# Patient Record
Sex: Male | Born: 1935 | Hispanic: No | Smoking: Former smoker
Health system: Southern US, Community
[De-identification: ages and names within clinical notes are randomized; demographics above are authoritative.]

## PROBLEM LIST (undated history)

## (undated) DIAGNOSIS — R739 Hyperglycemia, unspecified: Secondary | ICD-10-CM

## (undated) DIAGNOSIS — G8929 Other chronic pain: Secondary | ICD-10-CM

## (undated) DIAGNOSIS — R079 Chest pain, unspecified: Secondary | ICD-10-CM

## (undated) DIAGNOSIS — M1990 Unspecified osteoarthritis, unspecified site: Secondary | ICD-10-CM

## (undated) DIAGNOSIS — I10 Essential (primary) hypertension: Secondary | ICD-10-CM

## (undated) DIAGNOSIS — R69 Illness, unspecified: Secondary | ICD-10-CM

## (undated) DIAGNOSIS — E7800 Pure hypercholesterolemia, unspecified: Secondary | ICD-10-CM

## (undated) DIAGNOSIS — Z9009 Acquired absence of other part of head and neck: Secondary | ICD-10-CM

## (undated) DIAGNOSIS — H8110 Benign paroxysmal vertigo, unspecified ear: Secondary | ICD-10-CM

## (undated) DIAGNOSIS — M545 Low back pain, unspecified: Secondary | ICD-10-CM

## (undated) DIAGNOSIS — G5603 Carpal tunnel syndrome, bilateral upper limbs: Secondary | ICD-10-CM

## (undated) DIAGNOSIS — E890 Postprocedural hypothyroidism: Secondary | ICD-10-CM

## (undated) DIAGNOSIS — F0390 Unspecified dementia without behavioral disturbance: Secondary | ICD-10-CM

## (undated) DIAGNOSIS — E0590 Thyrotoxicosis, unspecified without thyrotoxic crisis or storm: Secondary | ICD-10-CM

## (undated) DIAGNOSIS — R011 Cardiac murmur, unspecified: Secondary | ICD-10-CM

## (undated) DIAGNOSIS — K5790 Diverticulosis of intestine, part unspecified, without perforation or abscess without bleeding: Secondary | ICD-10-CM

## (undated) DIAGNOSIS — D649 Anemia, unspecified: Secondary | ICD-10-CM

## (undated) DIAGNOSIS — M353 Polymyalgia rheumatica: Secondary | ICD-10-CM

## (undated) DIAGNOSIS — G629 Polyneuropathy, unspecified: Secondary | ICD-10-CM

## (undated) DIAGNOSIS — N400 Enlarged prostate without lower urinary tract symptoms: Secondary | ICD-10-CM

## (undated) DIAGNOSIS — E611 Iron deficiency: Secondary | ICD-10-CM

## (undated) DIAGNOSIS — K601 Chronic anal fissure: Secondary | ICD-10-CM

## (undated) DIAGNOSIS — Z9889 Other specified postprocedural states: Secondary | ICD-10-CM

## (undated) DIAGNOSIS — L439 Lichen planus, unspecified: Secondary | ICD-10-CM

## (undated) DIAGNOSIS — K219 Gastro-esophageal reflux disease without esophagitis: Secondary | ICD-10-CM

## (undated) HISTORY — PX: THYROIDECTOMY, PARTIAL: SHX18

## (undated) HISTORY — DX: Chronic anal fissure: K60.1

## (undated) HISTORY — DX: Illness, unspecified: R69

## (undated) HISTORY — DX: Cardiac murmur, unspecified: R01.1

## (undated) HISTORY — DX: Low back pain: M54.5

## (undated) HISTORY — DX: Other chronic pain: G89.29

## (undated) HISTORY — DX: Benign paroxysmal vertigo, unspecified ear: H81.10

## (undated) HISTORY — DX: Pure hypercholesterolemia, unspecified: E78.00

## (undated) HISTORY — PX: APPENDECTOMY: SHX54

## (undated) HISTORY — PX: OTHER SURGICAL HISTORY: SHX169

## (undated) HISTORY — DX: Polyneuropathy, unspecified: G62.9

## (undated) HISTORY — DX: Postprocedural hypothyroidism: E89.0

## (undated) HISTORY — DX: Carpal tunnel syndrome, bilateral upper limbs: G56.03

## (undated) HISTORY — DX: Low back pain, unspecified: M54.50

## (undated) HISTORY — DX: Gastro-esophageal reflux disease without esophagitis: K21.9

## (undated) HISTORY — DX: Gilbert syndrome: E80.4

## (undated) HISTORY — DX: Essential (primary) hypertension: I10

## (undated) HISTORY — DX: Hyperglycemia, unspecified: R73.9

## (undated) HISTORY — DX: Iron deficiency: E61.1

## (undated) HISTORY — PX: TONSILLECTOMY: SUR1361

## (undated) HISTORY — DX: Diverticulosis of intestine, part unspecified, without perforation or abscess without bleeding: K57.90

## (undated) HISTORY — DX: Thyrotoxicosis, unspecified without thyrotoxic crisis or storm: E05.90

## (undated) HISTORY — DX: Acquired absence of other organs: Z98.890

## (undated) HISTORY — DX: Chest pain, unspecified: R07.9

## (undated) HISTORY — DX: Lichen planus, unspecified: L43.9

## (undated) HISTORY — DX: Anemia, unspecified: D64.9

## (undated) HISTORY — DX: Polymyalgia rheumatica: M35.3

## (undated) HISTORY — DX: Benign prostatic hyperplasia without lower urinary tract symptoms: N40.0

## (undated) HISTORY — DX: Unspecified osteoarthritis, unspecified site: M19.90

## (undated) HISTORY — DX: Acquired absence of other part of head and neck: Z90.09

---

## 2011-04-09 DIAGNOSIS — Z79899 Other long term (current) drug therapy: Secondary | ICD-10-CM | POA: Diagnosis not present

## 2011-04-09 DIAGNOSIS — E782 Mixed hyperlipidemia: Secondary | ICD-10-CM | POA: Diagnosis not present

## 2011-04-09 DIAGNOSIS — I10 Essential (primary) hypertension: Secondary | ICD-10-CM | POA: Diagnosis not present

## 2011-04-09 DIAGNOSIS — M2550 Pain in unspecified joint: Secondary | ICD-10-CM | POA: Diagnosis not present

## 2011-05-12 DIAGNOSIS — Z79899 Other long term (current) drug therapy: Secondary | ICD-10-CM | POA: Diagnosis not present

## 2011-05-12 DIAGNOSIS — I10 Essential (primary) hypertension: Secondary | ICD-10-CM | POA: Diagnosis not present

## 2011-05-12 DIAGNOSIS — N400 Enlarged prostate without lower urinary tract symptoms: Secondary | ICD-10-CM | POA: Diagnosis not present

## 2011-05-12 DIAGNOSIS — E782 Mixed hyperlipidemia: Secondary | ICD-10-CM | POA: Diagnosis not present

## 2011-07-15 DIAGNOSIS — E782 Mixed hyperlipidemia: Secondary | ICD-10-CM | POA: Diagnosis not present

## 2011-07-15 DIAGNOSIS — I10 Essential (primary) hypertension: Secondary | ICD-10-CM | POA: Diagnosis not present

## 2011-09-08 DIAGNOSIS — H2510 Age-related nuclear cataract, unspecified eye: Secondary | ICD-10-CM | POA: Diagnosis not present

## 2011-09-08 DIAGNOSIS — H25049 Posterior subcapsular polar age-related cataract, unspecified eye: Secondary | ICD-10-CM | POA: Diagnosis not present

## 2011-09-18 DIAGNOSIS — IMO0002 Reserved for concepts with insufficient information to code with codable children: Secondary | ICD-10-CM | POA: Diagnosis not present

## 2011-09-18 DIAGNOSIS — H2589 Other age-related cataract: Secondary | ICD-10-CM | POA: Diagnosis not present

## 2011-09-18 DIAGNOSIS — H2510 Age-related nuclear cataract, unspecified eye: Secondary | ICD-10-CM | POA: Diagnosis not present

## 2011-09-18 DIAGNOSIS — H25049 Posterior subcapsular polar age-related cataract, unspecified eye: Secondary | ICD-10-CM | POA: Diagnosis not present

## 2011-11-18 DIAGNOSIS — I10 Essential (primary) hypertension: Secondary | ICD-10-CM | POA: Diagnosis not present

## 2011-11-18 DIAGNOSIS — Z1331 Encounter for screening for depression: Secondary | ICD-10-CM | POA: Diagnosis not present

## 2011-11-18 DIAGNOSIS — Z79899 Other long term (current) drug therapy: Secondary | ICD-10-CM | POA: Diagnosis not present

## 2011-11-18 DIAGNOSIS — Z23 Encounter for immunization: Secondary | ICD-10-CM | POA: Diagnosis not present

## 2011-11-18 DIAGNOSIS — E782 Mixed hyperlipidemia: Secondary | ICD-10-CM | POA: Diagnosis not present

## 2011-11-18 DIAGNOSIS — Z0000 Encounter for general adult medical examination without abnormal findings: Secondary | ICD-10-CM | POA: Diagnosis not present

## 2011-11-18 DIAGNOSIS — E039 Hypothyroidism, unspecified: Secondary | ICD-10-CM | POA: Diagnosis not present

## 2012-05-18 DIAGNOSIS — I10 Essential (primary) hypertension: Secondary | ICD-10-CM | POA: Diagnosis not present

## 2012-05-18 DIAGNOSIS — Z79899 Other long term (current) drug therapy: Secondary | ICD-10-CM | POA: Diagnosis not present

## 2012-05-18 DIAGNOSIS — R011 Cardiac murmur, unspecified: Secondary | ICD-10-CM | POA: Diagnosis not present

## 2012-05-18 DIAGNOSIS — E782 Mixed hyperlipidemia: Secondary | ICD-10-CM | POA: Diagnosis not present

## 2012-05-18 DIAGNOSIS — M1990 Unspecified osteoarthritis, unspecified site: Secondary | ICD-10-CM | POA: Diagnosis not present

## 2012-06-03 DIAGNOSIS — R011 Cardiac murmur, unspecified: Secondary | ICD-10-CM | POA: Diagnosis not present

## 2012-11-02 DIAGNOSIS — Z961 Presence of intraocular lens: Secondary | ICD-10-CM | POA: Diagnosis not present

## 2012-11-02 DIAGNOSIS — H52209 Unspecified astigmatism, unspecified eye: Secondary | ICD-10-CM | POA: Diagnosis not present

## 2012-11-25 DIAGNOSIS — I10 Essential (primary) hypertension: Secondary | ICD-10-CM | POA: Diagnosis not present

## 2012-11-25 DIAGNOSIS — E039 Hypothyroidism, unspecified: Secondary | ICD-10-CM | POA: Diagnosis not present

## 2012-11-25 DIAGNOSIS — Z23 Encounter for immunization: Secondary | ICD-10-CM | POA: Diagnosis not present

## 2012-11-25 DIAGNOSIS — Z0000 Encounter for general adult medical examination without abnormal findings: Secondary | ICD-10-CM | POA: Diagnosis not present

## 2012-11-25 DIAGNOSIS — M1990 Unspecified osteoarthritis, unspecified site: Secondary | ICD-10-CM | POA: Diagnosis not present

## 2012-11-25 DIAGNOSIS — E782 Mixed hyperlipidemia: Secondary | ICD-10-CM | POA: Diagnosis not present

## 2012-11-25 DIAGNOSIS — Z79899 Other long term (current) drug therapy: Secondary | ICD-10-CM | POA: Diagnosis not present

## 2012-11-25 DIAGNOSIS — R7301 Impaired fasting glucose: Secondary | ICD-10-CM | POA: Diagnosis not present

## 2012-11-25 DIAGNOSIS — Z1331 Encounter for screening for depression: Secondary | ICD-10-CM | POA: Diagnosis not present

## 2012-11-30 DIAGNOSIS — H903 Sensorineural hearing loss, bilateral: Secondary | ICD-10-CM | POA: Diagnosis not present

## 2012-12-02 DIAGNOSIS — R7301 Impaired fasting glucose: Secondary | ICD-10-CM | POA: Diagnosis not present

## 2013-06-01 DIAGNOSIS — Z79899 Other long term (current) drug therapy: Secondary | ICD-10-CM | POA: Diagnosis not present

## 2013-06-01 DIAGNOSIS — E782 Mixed hyperlipidemia: Secondary | ICD-10-CM | POA: Diagnosis not present

## 2013-06-01 DIAGNOSIS — I10 Essential (primary) hypertension: Secondary | ICD-10-CM | POA: Diagnosis not present

## 2013-08-31 DIAGNOSIS — I10 Essential (primary) hypertension: Secondary | ICD-10-CM | POA: Diagnosis not present

## 2013-08-31 DIAGNOSIS — E782 Mixed hyperlipidemia: Secondary | ICD-10-CM | POA: Diagnosis not present

## 2013-11-30 DIAGNOSIS — D649 Anemia, unspecified: Secondary | ICD-10-CM | POA: Diagnosis not present

## 2013-11-30 DIAGNOSIS — Z79899 Other long term (current) drug therapy: Secondary | ICD-10-CM | POA: Diagnosis not present

## 2013-11-30 DIAGNOSIS — E780 Pure hypercholesterolemia: Secondary | ICD-10-CM | POA: Diagnosis not present

## 2013-11-30 DIAGNOSIS — I10 Essential (primary) hypertension: Secondary | ICD-10-CM | POA: Diagnosis not present

## 2013-11-30 DIAGNOSIS — K219 Gastro-esophageal reflux disease without esophagitis: Secondary | ICD-10-CM | POA: Diagnosis not present

## 2013-11-30 DIAGNOSIS — Z1389 Encounter for screening for other disorder: Secondary | ICD-10-CM | POA: Diagnosis not present

## 2013-11-30 DIAGNOSIS — E890 Postprocedural hypothyroidism: Secondary | ICD-10-CM | POA: Diagnosis not present

## 2013-11-30 DIAGNOSIS — Z0000 Encounter for general adult medical examination without abnormal findings: Secondary | ICD-10-CM | POA: Diagnosis not present

## 2013-11-30 DIAGNOSIS — G603 Idiopathic progressive neuropathy: Secondary | ICD-10-CM | POA: Diagnosis not present

## 2013-12-01 DIAGNOSIS — Z23 Encounter for immunization: Secondary | ICD-10-CM | POA: Diagnosis not present

## 2014-01-09 ENCOUNTER — Encounter: Payer: Self-pay | Admitting: *Deleted

## 2014-01-11 ENCOUNTER — Encounter: Payer: Self-pay | Admitting: Cardiology

## 2014-01-11 ENCOUNTER — Ambulatory Visit (INDEPENDENT_AMBULATORY_CARE_PROVIDER_SITE_OTHER): Payer: Medicare Other | Admitting: Cardiology

## 2014-01-11 VITALS — BP 138/84 | HR 64 | Ht 69.0 in | Wt 159.6 lb

## 2014-01-11 DIAGNOSIS — E785 Hyperlipidemia, unspecified: Secondary | ICD-10-CM | POA: Diagnosis not present

## 2014-01-11 DIAGNOSIS — I10 Essential (primary) hypertension: Secondary | ICD-10-CM

## 2014-01-11 DIAGNOSIS — I208 Other forms of angina pectoris: Secondary | ICD-10-CM | POA: Diagnosis not present

## 2014-01-11 DIAGNOSIS — R079 Chest pain, unspecified: Secondary | ICD-10-CM | POA: Diagnosis not present

## 2014-01-11 NOTE — Progress Notes (Signed)
[address] [place], [place]  [address] Phone: [phone] Fax:  [phone]  Date:  01/11/2014   ID:  Benjamin Banks, DOB [birth year], MRN [id number]  PCP:  Benjamin Banks   History of Present Illness: Benjamin Banks is a 80 y.o. male here for the evaluation of exertional chest pain at the request of Dr. Pete Banks. Has risk factors of age, hypertension, previously described noncardiac chest pain. When he is out walking, he feels a discomfort in his upper chest that sometimes radiates to both sides of his jaw. When he stops it resolves. No rest discomfort. Over the past year this is been noticeable, however he downplays his symptoms.  He has been described as having aortic sclerosis after echocardiogram on 4/14 was performed.  He worked for years at [employer]. Graduated from [employer] in 1949. He smoked for several years but quit many years ago he states. He walks up proximally 1 mile a day. Mildly limited by arthritis, most noticeable in his right thumb joint.  Wt Readings from Last 3 Encounters:  01/11/14 159 lb 9.6 oz (72.394 kg)     Past Medical History  Diagnosis Date  . Hypercholesteremia   . Diverticulosis   . Benign positional vertigo   . Hypertension     labils  . Anemia   . Chronic low back pain   . Bilateral carpal tunnel syndrome   . DJD (degenerative joint disease)   . Polymyalgia rheumatica   . BPH (benign prostatic hyperplasia)   . Chest pain     noncardiac  . Disease     perrone  . GERD (gastroesophageal reflux disease)   . Hyperthyroidism   . H/O thyroidectomy   . Chronic anal fissure   . Lichen planus   . Peripheral neuropathy   . Iron deficiency   . Elevated blood sugar   . Heart murmur   . Gilbert's syndrome     Past Surgical History  Procedure Laterality Date  . Tonsillectomy    . Appendectomy    . Thyroidectomy, partial    . Hemrrhoidectomy    . Left lens implant    . Bottom teeth  removed with implants    . Left and right cataract      Current Outpatient Prescriptions  Medication Sig Dispense Refill  . amLODipine (NORVASC) 5 MG tablet Take 5 mg by mouth daily.    Marland Kitchen. aspirin 81 MG tablet Take 81 mg by mouth daily.    Marland Kitchen. atorvastatin (LIPITOR) 10 MG tablet Take 10 mg by mouth daily.    . Cholecalciferol (VITAMIN D-3) 1000 UNITS CAPS Take 1 capsule by mouth daily.    . hydrochlorothiazide (MICROZIDE) 12.5 MG capsule Take 12.5 mg by mouth daily.    Marland Kitchen. levothyroxine (SYNTHROID, LEVOTHROID) 88 MCG tablet Take 88 mcg by mouth every other day. Take every other day when not taking the 50 mcg    . SYNTHROID 50 MCG tablet Take 50 mcg by mouth every other day.  11   No current facility-administered medications for this visit.    Allergies:   No Known Allergies  Social History:  The patient  reports that he has quit smoking. He does not have any smokeless tobacco history on file. He reports that he drinks alcohol.   Family History  Problem Relation Age of Onset  . COPD Mother   . COPD Father   . Alcohol abuse Father  ROS:  Please see the history of present illness.   Denies any fevers, chills, orthopnea, PND   All other systems reviewed and negative.   PHYSICAL EXAM: VS:  BP 138/84 mmHg  Pulse 64  Ht 5\' 9"  (1.753 m)  Wt 159 lb 9.6 oz (72.394 kg)  BMI 23.56 kg/m2 Well nourished, well developed, in no acute distress HEENT: normal, [place]/AT, EOMI Neck: no JVD, normal carotid upstroke, no bruit Cardiac:  normal S1, S2; RRR; 2/6 systolic right upper sternal border murmur Lungs:  clear to auscultation bilaterally, no wheezing, rhonchi or rales Abd: soft, nontender, no hepatomegaly, no bruits Ext: no edema, 2+ distal pulses Skin: warm and dry GU: deferred Neuro: no focal abnormalities noted, AAO x 3  EKG:  01/11/14-sinus rhythm, sinus arrhythmia, nonspecific ST-T wave changes, J-point elevation noted in precordial leads, T-wave inversion 2, F possible ischemia.      ASSESSMENT AND PLAN:  1. Angina-symptoms with exertion. I agree with Dr. Pete Banks that this may be an indication of flow limiting coronary artery disease. After discussing possible stress test with him he respectfully declines at this time. Understands risks. He will let me know if his symptoms worsen or become more worrisome. He is currently on amlodipine which can serve as an antianginal. One could consider low-dose isosorbide however he does not wish to take another medication at this time.  2. Heart murmur-aortic sclerosis. Echo 2014 noted. 3. Essential hypertension-currently well controlled. 4. Former smoker-quit several years ago 5. Hyperlipidemia-continue with atorvastatin. Agree with low-dose.  Signed, Benjamin SchultzMark Kayleana Waites, [facility]  01/11/2014 12:19 PM

## 2014-01-11 NOTE — Patient Instructions (Signed)
The current medical regimen is effective;  continue present plan and medications.  Follow up as needed 

## 2014-01-15 ENCOUNTER — Encounter: Payer: Self-pay | Admitting: *Deleted

## 2014-02-20 DIAGNOSIS — I10 Essential (primary) hypertension: Secondary | ICD-10-CM | POA: Diagnosis not present

## 2014-12-05 DIAGNOSIS — Z0000 Encounter for general adult medical examination without abnormal findings: Secondary | ICD-10-CM | POA: Diagnosis not present

## 2014-12-05 DIAGNOSIS — E7800 Pure hypercholesterolemia, unspecified: Secondary | ICD-10-CM | POA: Diagnosis not present

## 2014-12-05 DIAGNOSIS — I10 Essential (primary) hypertension: Secondary | ICD-10-CM | POA: Diagnosis not present

## 2014-12-05 DIAGNOSIS — Z79899 Other long term (current) drug therapy: Secondary | ICD-10-CM | POA: Diagnosis not present

## 2014-12-05 DIAGNOSIS — G603 Idiopathic progressive neuropathy: Secondary | ICD-10-CM | POA: Diagnosis not present

## 2014-12-05 DIAGNOSIS — Z1389 Encounter for screening for other disorder: Secondary | ICD-10-CM | POA: Diagnosis not present

## 2014-12-05 DIAGNOSIS — Z23 Encounter for immunization: Secondary | ICD-10-CM | POA: Diagnosis not present

## 2014-12-05 DIAGNOSIS — K219 Gastro-esophageal reflux disease without esophagitis: Secondary | ICD-10-CM | POA: Diagnosis not present

## 2014-12-05 DIAGNOSIS — E890 Postprocedural hypothyroidism: Secondary | ICD-10-CM | POA: Diagnosis not present

## 2015-01-11 DIAGNOSIS — I10 Essential (primary) hypertension: Secondary | ICD-10-CM | POA: Diagnosis not present

## 2015-01-11 DIAGNOSIS — E7800 Pure hypercholesterolemia, unspecified: Secondary | ICD-10-CM | POA: Diagnosis not present

## 2015-01-11 DIAGNOSIS — Z79899 Other long term (current) drug therapy: Secondary | ICD-10-CM | POA: Diagnosis not present

## 2015-01-11 DIAGNOSIS — Z0000 Encounter for general adult medical examination without abnormal findings: Secondary | ICD-10-CM | POA: Diagnosis not present

## 2015-01-11 DIAGNOSIS — R739 Hyperglycemia, unspecified: Secondary | ICD-10-CM | POA: Diagnosis not present

## 2015-01-11 DIAGNOSIS — E039 Hypothyroidism, unspecified: Secondary | ICD-10-CM | POA: Diagnosis not present

## 2015-03-14 DIAGNOSIS — E039 Hypothyroidism, unspecified: Secondary | ICD-10-CM | POA: Diagnosis not present

## 2015-12-19 DIAGNOSIS — Z23 Encounter for immunization: Secondary | ICD-10-CM | POA: Diagnosis not present

## 2015-12-19 DIAGNOSIS — G3184 Mild cognitive impairment, so stated: Secondary | ICD-10-CM | POA: Diagnosis not present

## 2015-12-19 DIAGNOSIS — D692 Other nonthrombocytopenic purpura: Secondary | ICD-10-CM | POA: Diagnosis not present

## 2015-12-19 DIAGNOSIS — I10 Essential (primary) hypertension: Secondary | ICD-10-CM | POA: Diagnosis not present

## 2015-12-19 DIAGNOSIS — R739 Hyperglycemia, unspecified: Secondary | ICD-10-CM | POA: Diagnosis not present

## 2015-12-19 DIAGNOSIS — Z79899 Other long term (current) drug therapy: Secondary | ICD-10-CM | POA: Diagnosis not present

## 2015-12-19 DIAGNOSIS — E7800 Pure hypercholesterolemia, unspecified: Secondary | ICD-10-CM | POA: Diagnosis not present

## 2015-12-19 LAB — CBC AND DIFFERENTIAL
HEMATOCRIT: 40 — AB (ref 41–53)
Hemoglobin: 13.8 (ref 13.5–17.5)
Platelets: 262 (ref 150–399)
WBC: 6.9

## 2015-12-19 LAB — HEPATIC FUNCTION PANEL
ALT: 8 — AB (ref 10–40)
AST: 12 — AB (ref 14–40)
Alkaline Phosphatase: 59 (ref 25–125)
BILIRUBIN, TOTAL: 1

## 2015-12-19 LAB — BASIC METABOLIC PANEL
BUN: 20 (ref 4–21)
Creatinine: 1.1 (ref 0.6–1.3)
Glucose: 98
POTASSIUM: 5.1 (ref 3.4–5.3)
SODIUM: 143 (ref 137–147)

## 2015-12-19 LAB — HEMOGLOBIN A1C: HEMOGLOBIN A1C: 6

## 2016-04-01 DIAGNOSIS — F321 Major depressive disorder, single episode, moderate: Secondary | ICD-10-CM | POA: Diagnosis not present

## 2016-04-01 DIAGNOSIS — D692 Other nonthrombocytopenic purpura: Secondary | ICD-10-CM | POA: Diagnosis not present

## 2016-04-01 DIAGNOSIS — I10 Essential (primary) hypertension: Secondary | ICD-10-CM | POA: Diagnosis not present

## 2016-04-01 DIAGNOSIS — Z79899 Other long term (current) drug therapy: Secondary | ICD-10-CM | POA: Diagnosis not present

## 2016-04-01 DIAGNOSIS — E7800 Pure hypercholesterolemia, unspecified: Secondary | ICD-10-CM | POA: Diagnosis not present

## 2016-04-01 DIAGNOSIS — E039 Hypothyroidism, unspecified: Secondary | ICD-10-CM | POA: Diagnosis not present

## 2016-04-01 LAB — BASIC METABOLIC PANEL
BUN: 18 (ref 4–21)
Creatinine: 1.2 (ref 0.6–1.3)
Glucose: 100
Potassium: 4.7 (ref 3.4–5.3)
SODIUM: 135 — AB (ref 137–147)

## 2016-04-01 LAB — LIPID PANEL
CHOLESTEROL: 238 — AB (ref 0–200)
HDL: 44 (ref 35–70)
LDL CALC: 153
TRIGLYCERIDES: 200 — AB (ref 40–160)

## 2016-04-01 LAB — HEPATIC FUNCTION PANEL
ALK PHOS: 56 (ref 25–125)
ALT: 20 (ref 10–40)
AST: 30 (ref 14–40)
BILIRUBIN, TOTAL: 1

## 2016-04-01 LAB — CBC AND DIFFERENTIAL
HEMATOCRIT: 39 — AB (ref 41–53)
HEMOGLOBIN: 13.3 — AB (ref 13.5–17.5)
PLATELETS: 262 (ref 150–399)
WBC: 6.3

## 2016-04-01 LAB — TSH: TSH: 55.57 — AB (ref 0.41–5.90)

## 2016-04-25 DIAGNOSIS — L602 Onychogryphosis: Secondary | ICD-10-CM | POA: Diagnosis not present

## 2016-04-25 DIAGNOSIS — I10 Essential (primary) hypertension: Secondary | ICD-10-CM | POA: Diagnosis not present

## 2016-04-25 DIAGNOSIS — Z79899 Other long term (current) drug therapy: Secondary | ICD-10-CM | POA: Diagnosis not present

## 2016-04-25 DIAGNOSIS — E039 Hypothyroidism, unspecified: Secondary | ICD-10-CM | POA: Diagnosis not present

## 2016-04-25 LAB — TSH: TSH: 83.09 — AB (ref 0.41–5.90)

## 2016-04-25 LAB — BASIC METABOLIC PANEL
BUN: 14 (ref 4–21)
Creatinine: 1.1 (ref 0.6–1.3)
GLUCOSE: 96
POTASSIUM: 4.1 (ref 3.4–5.3)
SODIUM: 133 — AB (ref 137–147)

## 2016-04-28 ENCOUNTER — Emergency Department (HOSPITAL_COMMUNITY): Payer: Medicare Other

## 2016-04-28 ENCOUNTER — Encounter (HOSPITAL_COMMUNITY): Payer: Self-pay

## 2016-04-28 ENCOUNTER — Inpatient Hospital Stay (HOSPITAL_COMMUNITY)
Admission: EM | Admit: 2016-04-28 | Discharge: 2016-05-03 | DRG: 603 | Disposition: A | Discharge status: Skilled Nursing Facility | Payer: Medicare Other | Attending: Internal Medicine | Admitting: Internal Medicine

## 2016-04-28 DIAGNOSIS — F3289 Other specified depressive episodes: Secondary | ICD-10-CM | POA: Diagnosis not present

## 2016-04-28 DIAGNOSIS — R531 Weakness: Secondary | ICD-10-CM | POA: Diagnosis not present

## 2016-04-28 DIAGNOSIS — F0390 Unspecified dementia without behavioral disturbance: Secondary | ICD-10-CM | POA: Diagnosis present

## 2016-04-28 DIAGNOSIS — R627 Adult failure to thrive: Secondary | ICD-10-CM | POA: Diagnosis present

## 2016-04-28 DIAGNOSIS — F32A Depression, unspecified: Secondary | ICD-10-CM | POA: Diagnosis present

## 2016-04-28 DIAGNOSIS — I10 Essential (primary) hypertension: Secondary | ICD-10-CM | POA: Diagnosis present

## 2016-04-28 DIAGNOSIS — N400 Enlarged prostate without lower urinary tract symptoms: Secondary | ICD-10-CM | POA: Diagnosis present

## 2016-04-28 DIAGNOSIS — E440 Moderate protein-calorie malnutrition: Secondary | ICD-10-CM | POA: Diagnosis present

## 2016-04-28 DIAGNOSIS — Z7982 Long term (current) use of aspirin: Secondary | ICD-10-CM

## 2016-04-28 DIAGNOSIS — F329 Major depressive disorder, single episode, unspecified: Secondary | ICD-10-CM | POA: Diagnosis present

## 2016-04-28 DIAGNOSIS — S0003XA Contusion of scalp, initial encounter: Secondary | ICD-10-CM | POA: Diagnosis present

## 2016-04-28 DIAGNOSIS — R2681 Unsteadiness on feet: Secondary | ICD-10-CM | POA: Diagnosis not present

## 2016-04-28 DIAGNOSIS — Z9841 Cataract extraction status, right eye: Secondary | ICD-10-CM

## 2016-04-28 DIAGNOSIS — S51011A Laceration without foreign body of right elbow, initial encounter: Secondary | ICD-10-CM | POA: Diagnosis present

## 2016-04-28 DIAGNOSIS — E860 Dehydration: Secondary | ICD-10-CM | POA: Diagnosis present

## 2016-04-28 DIAGNOSIS — Z79899 Other long term (current) drug therapy: Secondary | ICD-10-CM

## 2016-04-28 DIAGNOSIS — L03113 Cellulitis of right upper limb: Secondary | ICD-10-CM | POA: Diagnosis not present

## 2016-04-28 DIAGNOSIS — E0590 Thyrotoxicosis, unspecified without thyrotoxic crisis or storm: Secondary | ICD-10-CM | POA: Diagnosis present

## 2016-04-28 DIAGNOSIS — E222 Syndrome of inappropriate secretion of antidiuretic hormone: Secondary | ICD-10-CM | POA: Diagnosis present

## 2016-04-28 DIAGNOSIS — W19XXXA Unspecified fall, initial encounter: Secondary | ICD-10-CM | POA: Diagnosis not present

## 2016-04-28 DIAGNOSIS — G629 Polyneuropathy, unspecified: Secondary | ICD-10-CM | POA: Diagnosis present

## 2016-04-28 DIAGNOSIS — Z9842 Cataract extraction status, left eye: Secondary | ICD-10-CM

## 2016-04-28 DIAGNOSIS — S50311A Abrasion of right elbow, initial encounter: Secondary | ICD-10-CM | POA: Diagnosis not present

## 2016-04-28 DIAGNOSIS — F0391 Unspecified dementia with behavioral disturbance: Secondary | ICD-10-CM | POA: Diagnosis not present

## 2016-04-28 DIAGNOSIS — S0990XA Unspecified injury of head, initial encounter: Secondary | ICD-10-CM

## 2016-04-28 DIAGNOSIS — Z9181 History of falling: Secondary | ICD-10-CM | POA: Diagnosis not present

## 2016-04-28 DIAGNOSIS — E785 Hyperlipidemia, unspecified: Secondary | ICD-10-CM | POA: Diagnosis present

## 2016-04-28 DIAGNOSIS — Z66 Do not resuscitate: Secondary | ICD-10-CM | POA: Diagnosis present

## 2016-04-28 DIAGNOSIS — M6281 Muscle weakness (generalized): Secondary | ICD-10-CM | POA: Diagnosis not present

## 2016-04-28 DIAGNOSIS — Y92009 Unspecified place in unspecified non-institutional (private) residence as the place of occurrence of the external cause: Secondary | ICD-10-CM | POA: Diagnosis not present

## 2016-04-28 DIAGNOSIS — Z9114 Patient's other noncompliance with medication regimen: Secondary | ICD-10-CM

## 2016-04-28 DIAGNOSIS — E039 Hypothyroidism, unspecified: Secondary | ICD-10-CM | POA: Diagnosis not present

## 2016-04-28 DIAGNOSIS — S299XXA Unspecified injury of thorax, initial encounter: Secondary | ICD-10-CM | POA: Diagnosis not present

## 2016-04-28 DIAGNOSIS — R2689 Other abnormalities of gait and mobility: Secondary | ICD-10-CM | POA: Diagnosis not present

## 2016-04-28 DIAGNOSIS — K219 Gastro-esophageal reflux disease without esophagitis: Secondary | ICD-10-CM | POA: Diagnosis present

## 2016-04-28 DIAGNOSIS — M545 Low back pain: Secondary | ICD-10-CM | POA: Diagnosis present

## 2016-04-28 DIAGNOSIS — E890 Postprocedural hypothyroidism: Secondary | ICD-10-CM | POA: Diagnosis present

## 2016-04-28 DIAGNOSIS — M353 Polymyalgia rheumatica: Secondary | ICD-10-CM | POA: Diagnosis present

## 2016-04-28 DIAGNOSIS — W1830XA Fall on same level, unspecified, initial encounter: Secondary | ICD-10-CM | POA: Diagnosis present

## 2016-04-28 DIAGNOSIS — Z87891 Personal history of nicotine dependence: Secondary | ICD-10-CM

## 2016-04-28 DIAGNOSIS — L0390 Cellulitis, unspecified: Secondary | ICD-10-CM | POA: Diagnosis not present

## 2016-04-28 DIAGNOSIS — Z6822 Body mass index (BMI) 22.0-22.9, adult: Secondary | ICD-10-CM | POA: Diagnosis not present

## 2016-04-28 DIAGNOSIS — G8929 Other chronic pain: Secondary | ICD-10-CM | POA: Diagnosis present

## 2016-04-28 DIAGNOSIS — E7800 Pure hypercholesterolemia, unspecified: Secondary | ICD-10-CM | POA: Diagnosis present

## 2016-04-28 DIAGNOSIS — E781 Pure hyperglyceridemia: Secondary | ICD-10-CM | POA: Diagnosis not present

## 2016-04-28 DIAGNOSIS — R278 Other lack of coordination: Secondary | ICD-10-CM | POA: Diagnosis not present

## 2016-04-28 DIAGNOSIS — K601 Chronic anal fissure: Secondary | ICD-10-CM | POA: Diagnosis present

## 2016-04-28 DIAGNOSIS — G609 Hereditary and idiopathic neuropathy, unspecified: Secondary | ICD-10-CM | POA: Diagnosis not present

## 2016-04-28 DIAGNOSIS — H81399 Other peripheral vertigo, unspecified ear: Secondary | ICD-10-CM | POA: Diagnosis not present

## 2016-04-28 DIAGNOSIS — S0180XA Unspecified open wound of other part of head, initial encounter: Secondary | ICD-10-CM | POA: Diagnosis not present

## 2016-04-28 DIAGNOSIS — M25521 Pain in right elbow: Secondary | ICD-10-CM | POA: Diagnosis not present

## 2016-04-28 DIAGNOSIS — N179 Acute kidney failure, unspecified: Secondary | ICD-10-CM

## 2016-04-28 DIAGNOSIS — Z961 Presence of intraocular lens: Secondary | ICD-10-CM | POA: Diagnosis present

## 2016-04-28 DIAGNOSIS — R4182 Altered mental status, unspecified: Secondary | ICD-10-CM | POA: Diagnosis not present

## 2016-04-28 DIAGNOSIS — R262 Difficulty in walking, not elsewhere classified: Secondary | ICD-10-CM | POA: Diagnosis present

## 2016-04-28 DIAGNOSIS — R41841 Cognitive communication deficit: Secondary | ICD-10-CM | POA: Diagnosis not present

## 2016-04-28 DIAGNOSIS — R9431 Abnormal electrocardiogram [ECG] [EKG]: Secondary | ICD-10-CM | POA: Diagnosis not present

## 2016-04-28 DIAGNOSIS — S199XXA Unspecified injury of neck, initial encounter: Secondary | ICD-10-CM | POA: Diagnosis not present

## 2016-04-28 HISTORY — DX: Unspecified dementia, unspecified severity, without behavioral disturbance, psychotic disturbance, mood disturbance, and anxiety: F03.90

## 2016-04-28 LAB — CBC WITH DIFFERENTIAL/PLATELET
Basophils Absolute: 0 10*3/uL (ref 0.0–0.1)
Basophils Relative: 0 %
EOS PCT: 1 %
Eosinophils Absolute: 0.1 10*3/uL (ref 0.0–0.7)
HCT: 34.4 % — ABNORMAL LOW (ref 39.0–52.0)
Hemoglobin: 11.6 g/dL — ABNORMAL LOW (ref 13.0–17.0)
LYMPHS ABS: 1 10*3/uL (ref 0.7–4.0)
LYMPHS PCT: 15 %
MCH: 31.3 pg (ref 26.0–34.0)
MCHC: 33.7 g/dL (ref 30.0–36.0)
MCV: 92.7 fL (ref 78.0–100.0)
MONO ABS: 0.7 10*3/uL (ref 0.1–1.0)
Monocytes Relative: 10 %
Neutro Abs: 4.8 10*3/uL (ref 1.7–7.7)
Neutrophils Relative %: 74 %
PLATELETS: 236 10*3/uL (ref 150–400)
RBC: 3.71 MIL/uL — ABNORMAL LOW (ref 4.22–5.81)
RDW: 13.9 % (ref 11.5–15.5)
WBC: 6.6 10*3/uL (ref 4.0–10.5)

## 2016-04-28 LAB — BASIC METABOLIC PANEL
Anion gap: 11 (ref 5–15)
BUN: 14 mg/dL (ref 6–20)
CO2: 26 mmol/L (ref 22–32)
Calcium: 9.2 mg/dL (ref 8.9–10.3)
Chloride: 94 mmol/L — ABNORMAL LOW (ref 101–111)
Creatinine, Ser: 1.2 mg/dL (ref 0.61–1.24)
GFR calc Af Amer: >60 mL/min (ref >60)
GFR, EST NON AFRICAN AMERICAN: 52 mL/min — AB (ref >60)
GLUCOSE: 94 mg/dL (ref 65–99)
POTASSIUM: 4.5 mmol/L (ref 3.5–5.1)
Sodium: 131 mmol/L — ABNORMAL LOW (ref 135–145)

## 2016-04-28 LAB — URINALYSIS, ROUTINE W REFLEX MICROSCOPIC
Bilirubin Urine: NEGATIVE
GLUCOSE, UA: NEGATIVE mg/dL
HGB URINE DIPSTICK: NEGATIVE
Ketones, ur: NEGATIVE mg/dL
Leukocytes, UA: NEGATIVE
Nitrite: NEGATIVE
PROTEIN: NEGATIVE mg/dL
Specific Gravity, Urine: 1.016 (ref 1.005–1.030)
pH: 6 (ref 5.0–8.0)

## 2016-04-28 LAB — I-STAT CG4 LACTIC ACID, ED: Lactic Acid, Venous: 1.03 mmol/L (ref 0.5–1.9)

## 2016-04-28 MED ORDER — SODIUM CHLORIDE 0.9 % IV BOLUS (SEPSIS)
1000.0000 mL | Freq: Once | INTRAVENOUS | Status: AC
  Administered 2016-04-28: 1000 mL via INTRAVENOUS

## 2016-04-28 MED ORDER — VANCOMYCIN HCL IN DEXTROSE 1-5 GM/200ML-% IV SOLN
1000.0000 mg | Freq: Once | INTRAVENOUS | Status: AC
  Administered 2016-04-28: 1000 mg via INTRAVENOUS
  Filled 2016-04-28: qty 200

## 2016-04-28 MED ORDER — VANCOMYCIN HCL IN DEXTROSE 750-5 MG/150ML-% IV SOLN: 750.0000 mg | INTRAVENOUS | Status: DC

## 2016-04-28 NOTE — ED Provider Notes (Signed)
[facility]-EMERGENCY DEPT Provider Note   CSN: [id number] Arrival date & time: 04/28/16  4098    History   Chief Complaint Chief Complaint  Patient presents with  . Fall    HPI Benjamin Banks is a 83 y.o. male who present with fall and failure to thrive. PMH significant for dementia, HTN, HLD, hypothyroidism. Several concerned friends/neighbors are at bedside who provide history. They state that over the past several months he has been slowly declining. He lives at home alone. They have been having to provide more care for him by feeding him and making sure he takes his medicines. Today they noticed that all his pills were not in his pill box and they belive he may have taken all of them at once(?3 days worth). They state he has recently been having difficulty walking. He had an unwitnessed fall earlier today and sustained a head injury. He also has right elbow wound with infection which has not been treated. They are working on [employer]/restorer POA.   Level 5 caveat due to dementia.    HPI  Past Medical History:  Diagnosis Date  . Anemia   . Benign positional vertigo   . Bilateral carpal tunnel syndrome   . BPH (benign prostatic hyperplasia)   . Chest pain    noncardiac  . Chronic anal fissure   . Chronic low back pain   . Disease    perrone  . Diverticulosis   . DJD (degenerative joint disease)   . Elevated blood sugar   . GERD (gastroesophageal reflux disease)   . Gilbert's syndrome   . H/O thyroidectomy   . Heart murmur   . Hypercholesteremia   . Hypertension    labils  . Hyperthyroidism   . Iron deficiency   . Lichen planus   . Peripheral neuropathy   . Polymyalgia rheumatica     Patient Active Problem List   Diagnosis Date Noted  . Exertional chest pain 01/11/2014  . Angina decubitus (HCC) 01/11/2014  . Hyperlipidemia 01/11/2014  . Essential hypertension 01/11/2014    Past Surgical History:  Procedure Laterality Date  . APPENDECTOMY    . bottom  teeth removed with implants    . hemrrhoidectomy    . left and right cataract    . left lens implant    . THYROIDECTOMY, PARTIAL    . TONSILLECTOMY        Home Medications    Prior to Admission medications   Medication Sig Start Date End Date Taking? Authorizing Provider  amLODipine (NORVASC) 5 MG tablet Take 5 mg by mouth daily.    Historical Provider, MD  aspirin 81 MG tablet Take 81 mg by mouth daily.    Historical Provider, MD  atorvastatin (LIPITOR) 10 MG tablet Take 10 mg by mouth daily.    Historical Provider, MD  Cholecalciferol (VITAMIN D-3) 1000 UNITS CAPS Take 1 capsule by mouth daily.    Historical Provider, MD  hydrochlorothiazide (MICROZIDE) 12.5 MG capsule Take 12.5 mg by mouth daily.    Historical Provider, MD  levothyroxine (SYNTHROID, LEVOTHROID) 88 MCG tablet Take 88 mcg by mouth every other day. Take every other day when not taking the 50 mcg    Historical Provider, MD  SYNTHROID 50 MCG tablet Take 50 mcg by mouth every other day. 12/26/13   Historical Provider, MD    Family History Family History  Problem Relation Age of Onset  . COPD Mother   . COPD Father   . Alcohol abuse  Father     Social History Social History  Substance Use Topics  . Smoking status: Former Games developermoker  . Smokeless tobacco: Not on file  . Alcohol use Yes     Allergies   Patient has no known allergies.   Review of Systems Review of Systems  Unable to perform ROS: Dementia     Physical Exam Updated Vital Signs BP (!) 144/83   Pulse 63   Temp 98.4 F (36.9 C) (Oral)   Resp 18   SpO2 94%   Physical Exam  Constitutional: He appears well-developed and well-nourished. No distress.  HENT:  Head: Normocephalic.  Hematoma on top of scalp. Bleeding controlled  Eyes: Conjunctivae are normal. Pupils are equal, round, and reactive to light. Right eye exhibits no discharge. Left eye exhibits no discharge. No scleral icterus.  Neck: Normal range of motion.  Cardiovascular: Normal  rate and regular rhythm.  Exam reveals no gallop and no friction rub.   Murmur heard. Pulmonary/Chest: Effort normal and breath sounds normal. No respiratory distress. He has no wheezes. He has no rales. He exhibits no tenderness.  Abdominal: Soft. Bowel sounds are normal. He exhibits no distension and no mass. There is no tenderness. There is no rebound and no guarding. No hernia.  Musculoskeletal:  Right elbow: Erythematous wound with purulent drainage  Bilateral feet: onychomycosis bilaterally  Neurological: He is alert. He is disoriented. GCS eye subscore is 4. GCS verbal subscore is 5. GCS motor subscore is 6.  Mental Status:  Alert, disoriented. Speech fluent without evidence of aphasia.   Cranial Nerves:  II:  Peripheral visual fields grossly normal, pupils equal, round, reactive to light III,IV, VI: ptosis not present, extra-ocular motions intact bilaterally  V,VII: smile symmetric, facial light touch sensation equal VIII: hearing grossly normal to voice  X: uvula elevates symmetrically  XI: bilateral shoulder shrug symmetric and strong XII: midline tongue extension without fassiculations Motor:  Normal tone. 5/5 in upper and lower extremities bilaterally including strong and equal grip strength and dorsiflexion/plantar flexion Sensory: Pinprick and light touch normal in all extremities.  Cerebellar: normal finger-to-nose with bilateral upper extremities Gait: unsteady and unsafe - trying to hold on to things in the room CV: distal pulses palpable throughout     Skin: Skin is warm and dry.  Psychiatric: He has a normal mood and affect. His behavior is normal.  Nursing note and vitals reviewed.    ED Treatments / Results  Labs (all labs ordered are listed, but only abnormal results are displayed) Labs Reviewed  CBC WITH DIFFERENTIAL/PLATELET - Abnormal; Notable for the following:       Result Value   RBC 3.71 (*)    Hemoglobin 11.6 (*)    HCT 34.4 (*)    All other  components within normal limits  BASIC METABOLIC PANEL - Abnormal; Notable for the following:    Sodium 131 (*)    Chloride 94 (*)    GFR calc non Af Amer 52 (*)    All other components within normal limits  CULTURE, BLOOD (ROUTINE X 2)  CULTURE, BLOOD (ROUTINE X 2)  URINALYSIS, ROUTINE W REFLEX MICROSCOPIC  I-STAT CG4 LACTIC ACID, ED    EKG  EKG Interpretation None       Radiology Dg Chest 2 View  Result Date: 04/28/2016 CLINICAL DATA:  Status post fall, with confusion and concern for chest injury. Initial encounter. EXAM: CHEST  2 VIEW COMPARISON:  None. FINDINGS: The lungs are well-aerated and clear. There is  no evidence of focal opacification, pleural effusion or pneumothorax. The heart is borderline normal in size. No acute osseous abnormalities are seen. Scattered calcification is seen along the lower thoracic and abdominal aorta. IMPRESSION: 1. No acute cardiopulmonary process seen. No displaced rib fractures identified. 2. Scattered aortic atherosclerosis. Electronically Signed   By: Roanna Raider M.D.   On: 04/28/2016 21:18   Dg Elbow Complete Right  Result Date: 04/28/2016 CLINICAL DATA:  Status post fall, with right posterior elbow abrasion and pain. Initial encounter. EXAM: RIGHT ELBOW - COMPLETE 3+ VIEW COMPARISON:  None. FINDINGS: There is no evidence of fracture or dislocation. The visualized joint spaces are preserved. No significant joint effusion is identified. The soft tissues are unremarkable in appearance Dorsal soft tissue air and swelling is noted, reflecting underlying laceration. No radiopaque foreign bodies are seen. IMPRESSION: 1. No evidence of fracture or dislocation. 2. Dorsal soft tissue air and swelling noted, reflecting underlying laceration. Electronically Signed   By: Roanna Raider M.D.   On: 04/28/2016 21:11   Ct Head Wo Contrast  Result Date: 04/28/2016 CLINICAL DATA:  Fall, confusion EXAM: CT HEAD WITHOUT CONTRAST CT CERVICAL SPINE WITHOUT  CONTRAST TECHNIQUE: Multidetector CT imaging of the head and cervical spine was performed following the standard protocol without intravenous contrast. Multiplanar CT image reconstructions of the cervical spine were also generated. COMPARISON:  None. FINDINGS: CT HEAD FINDINGS Brain: No evidence of acute infarction, hemorrhage, extra-axial collection or mass lesion/mass effect. Global cortical and central atrophy. Secondary mild ventriculomegaly. Extensive subcortical white matter and periventricular small vessel ischemic changes. Vascular: Mild intracranial atherosclerosis. Skull: Normal. Negative for fracture or focal lesion. Sinuses/Orbits: The visualized paranasal sinuses are essentially clear. The mastoid air cells are unopacified. Other: None. CT CERVICAL SPINE FINDINGS Alignment: Normal cervical lordosis. Skull base and vertebrae: No acute fracture. No primary bone lesion or focal pathologic process. Soft tissues and spinal canal: No prevertebral fluid or swelling. No visible canal hematoma. Disc levels: Mild degenerative changes, most prominent at C4-5, C5-6, and C6-7. Spinal canal is patent. Upper chest: Visualized lung apices are clear. Other: Visualized thyroid is unremarkable. IMPRESSION: No evidence of acute intracranial abnormality. Atrophy with secondary ventriculomegaly. Extensive small vessel ischemic changes. No evidence of traumatic injury to the cervical spine. Mild multilevel degenerative changes. Electronically Signed   By: Charline Bills M.D.   On: 04/28/2016 20:56   Ct Cervical Spine Wo Contrast  Result Date: 04/28/2016 CLINICAL DATA:  Fall, confusion EXAM: CT HEAD WITHOUT CONTRAST CT CERVICAL SPINE WITHOUT CONTRAST TECHNIQUE: Multidetector CT imaging of the head and cervical spine was performed following the standard protocol without intravenous contrast. Multiplanar CT image reconstructions of the cervical spine were also generated. COMPARISON:  None. FINDINGS: CT HEAD FINDINGS  Brain: No evidence of acute infarction, hemorrhage, extra-axial collection or mass lesion/mass effect. Global cortical and central atrophy. Secondary mild ventriculomegaly. Extensive subcortical white matter and periventricular small vessel ischemic changes. Vascular: Mild intracranial atherosclerosis. Skull: Normal. Negative for fracture or focal lesion. Sinuses/Orbits: The visualized paranasal sinuses are essentially clear. The mastoid air cells are unopacified. Other: None. CT CERVICAL SPINE FINDINGS Alignment: Normal cervical lordosis. Skull base and vertebrae: No acute fracture. No primary bone lesion or focal pathologic process. Soft tissues and spinal canal: No prevertebral fluid or swelling. No visible canal hematoma. Disc levels: Mild degenerative changes, most prominent at C4-5, C5-6, and C6-7. Spinal canal is patent. Upper chest: Visualized lung apices are clear. Other: Visualized thyroid is unremarkable. IMPRESSION: No evidence of acute intracranial  abnormality. Atrophy with secondary ventriculomegaly. Extensive small vessel ischemic changes. No evidence of traumatic injury to the cervical spine. Mild multilevel degenerative changes. Electronically Signed   By: Charline Bills M.D.   On: 04/28/2016 20:56    Procedures Procedures (including critical care time)  Medications Ordered in ED Medications  vancomycin (VANCOCIN) IVPB 1000 mg/200 mL premix (1,000 mg Intravenous New Bag/Given 04/28/16 2255)  vancomycin (VANCOCIN) IVPB 750 mg/150 ml premix (not administered)  sodium chloride 0.9 % bolus 1,000 mL (1,000 mLs Intravenous New Bag/Given 04/28/16 2256)     Initial Impression / Assessment and Plan / ED Course  I have reviewed the triage vital signs and the nursing notes.  Pertinent labs & imaging results that were available during my care of the patient were reviewed by me and considered in my medical decision making (see chart for details).  83 year old male with failure to thrive and  cellulitis. He is unable to walk and unable to care for himself. He also has possibly overdosed on some of his medicines which may have precipitated his fall. He also has an infection on his elbow and has demonstrated he is unable to take medicines safely on his own. Vancomycin given in ED. Vitals are normal. Labs overall unremarkable. CT head and neck negative. And CXR and elbow xray are negative. Will bring him in for Obs. Spoke with Dr. Clyde Lundborg who will admit.  Final Clinical Impressions(s) / ED Diagnoses   Final diagnoses:  Cellulitis of right upper extremity  Fall, initial encounter  Injury of head, initial encounter    New Prescriptions New Prescriptions   No medications on file     Bethel Born, PA-C 04/29/16 0047    Canary Brim Tegeler, MD 04/30/16 [phone]

## 2016-04-28 NOTE — ED Triage Notes (Signed)
Per EMS pt from home, fell over feet and fell back, hematoma R side head, no LOC, bleeding controlled, pupils pinpoint non-reactive, is on aspirin, A+O baseline, unaware of what day it is

## 2016-04-28 NOTE — ED Notes (Signed)
Pt returned from ct

## 2016-04-28 NOTE — Progress Notes (Signed)
Pharmacy Antibiotic Note  Yuvin Lenetta QuakerJr Bach is a 83 y.o. male admitted on 04/28/2016 with cellulitis.  Pharmacy has been consulted for vancomycin dosing.  On admission, patient is afebrile with WBC within normal limits. SCr 1.2 (baseline unknown), with CrCL ~39 mL/min.   Plan: Vancomycin 1000mg  IV x1, then 750mg  IV every 24 hours Monitor renal function, clinical progress, VT as indicated  Temp (24hrs), Avg:98.4 F (36.9 C), Min:98.4 F (36.9 C), Max:98.4 F (36.9 C)   Recent Labs Lab 04/28/16 1943  WBC 6.6  CREATININE 1.20    CrCl cannot be calculated (Unknown ideal weight.).    No Known Allergies  Antimicrobials this admission: 3/19 vancomycin >>   Carylon PerchesMaggie Shuda, PharmD Acute Care Pharmacy Resident  Pager: [phone] 04/28/2016

## 2016-04-28 NOTE — H&P (Signed)
History and Physical    Benjamin Banks TKW:[id number] DOB: [birth year] DOA: 04/28/2016  Referring MD/NP/PA:   PCP: Mathews Argyle, MD   Patient coming from:  The patient is coming from home.  At baseline, pt is partially dependent for most of ADL.   Chief Complaint: failure to thrive, fall, right elbow wound infection   HPI: Benjamin Banks is a 83 y.o. male with medical history significant of dementia, peripheral neuropathy, hypothyroidism, Gilbert's syndrome, BPH, anemia, vertigo, hypertension, hyperlipidemia, GERD, depression, PMR, who presents with failure to thrive, fall, right elbow wound infection.  Patient has dementia and is unable to provide accurate medical history, therefore, most of the history is obtained by discussing the case with ED physician, per EMS report and per his friends/neighbors and nephew.   Several concerned friends/neighbors are at bedside. Per report, pt has been slowly declining in the past several months. pt lives at home alone. Per his friends/neighbors, they have been having to provide more care for him by feeding him and making sure he takes his medicines. They state he has recently been having difficulty walking. He had an unwitnessed fall earlier today and sustained a head injury, developed hematoma in the back of his head. Today they noticed that all his pills were not in his pill box and they belive he may have taken all of them at once(?3 days worth). Pt has small wound in right elbow wound with infection which has not been treated. Per his friends/neighbors and nephew, they are working on [place]. Pt dose not seem to have chest pain, unilateral weakness, hearing loss, vision changes, shortness breath, fever, chills, nausea, vomiting, diarrhea, abdominal pain.  ED Course: pt was found to have WBC 6.6, negative urinalysis, AKI with Cre 1.20, negative chest x-ray, x-ray of right elbow is negative. Temperature normal, no tachycardia,  oxygen saturation 94% on room air. CT head and CT C-spine is negative for acute abnormalities, but showed extensive small vessel ischemic changes.  Review of Systems: Could not be reviewed accurately due to dementia.  Allergy: No Known Allergies  Past Medical History:  Diagnosis Date  . Anemia   . Benign positional vertigo   . Bilateral carpal tunnel syndrome   . BPH (benign prostatic hyperplasia)   . Chest pain    noncardiac  . Chronic anal fissure   . Chronic low back pain   . Disease    perrone  . Diverticulosis   . DJD (degenerative joint disease)   . Elevated blood sugar   . GERD (gastroesophageal reflux disease)   . Gilbert's syndrome   . H/O thyroidectomy   . Heart murmur   . Hypercholesteremia   . Hypertension    labils  . Hyperthyroidism   . Iron deficiency   . Lichen planus   . Peripheral neuropathy ([place])   . Polymyalgia rheumatica (HCC)     Past Surgical History:  Procedure Laterality Date  . APPENDECTOMY    . bottom teeth removed with implants    . hemrrhoidectomy    . left and right cataract    . left lens implant    . THYROIDECTOMY, PARTIAL    . TONSILLECTOMY      Social History:  reports that he has quit smoking. He has never used smokeless tobacco. He reports that he drinks alcohol. His drug history is not on file.  Family History:  Family History  Problem Relation Age of Onset  . COPD Mother   . COPD Father   .  Alcohol abuse Father      Prior to Admission medications   Medication Sig Start Date End Date Taking? Authorizing Provider  amLODipine (NORVASC) 2.5 MG tablet Take 2.5 mg by mouth daily.   Yes Historical Provider, MD  aspirin 81 MG tablet Take 81 mg by mouth daily.   Yes Historical Provider, MD  atorvastatin (LIPITOR) 10 MG tablet Take 10 mg by mouth daily.   Yes Historical Provider, MD  hydrochlorothiazide (MICROZIDE) 12.5 MG capsule Take 12.5 mg by mouth daily.   Yes Historical Provider, MD  sertraline (ZOLOFT) 25 MG tablet Take  25 mg by mouth daily.   Yes Historical Provider, MD  SYNTHROID 50 MCG tablet Take 50 mcg by mouth daily before breakfast.  12/26/13  Yes Historical Provider, MD    Physical Exam: Vitals:   04/29/16 0138 04/29/16 0147 04/29/16 0150 04/29/16 0431  BP: (!) 161/93 140/76 (!) 122/58 (!) 143/67  Pulse: 68 71 77 97  Resp: _0 Temp: 98.1 F (36.7 C) 98.1 F (36.7 C) 98.1 F (36.7 C) 97.5 F (36.4 C)  TempSrc: Oral Oral Oral Axillary  SpO2: 97% 97% 100% 96%  Weight: 68.2 kg (150 lb 4.8 oz)     Height: _1  (1.727 m)      General: Not in acute distress HEENT: has hematoma in the top of head.       Eyes: PERRL, EOMI, no scleral icterus.       ENT: No discharge from the ears and nose, no pharynx injection, no tonsillar enlargement.        Neck: No JVD, no bruit, no mass felt. Heme: No neck lymph node enlargement. Cardiac: O8/T2, RRR, 2/6 systolic murmurs, No gallops or rubs. Respiratory:  No rales, wheezing, rhonchi or rubs. GI: Soft, nondistended, nontender, no rebound pain, no organomegaly, BS present. GU: No hematuria Ext: No pitting leg edema bilaterally. 2+DP/PT pulse bilaterally. Musculoskeletal: No joint deformities, No joint redness or warmth, no limitation of ROM in spin. Skin: Has a small wound in right elbow with little pus draining out and also with surrounding erythema. Neuro: Alert, oriented to place, but not to time and person, cranial nerves II-XII grossly intact, moves all extremities normally. Psych: Patient is not  psychotic, no suicidal or hemocidal ideation.  Labs on Admission: I have personally reviewed following labs and imaging studies  CBC:  Recent Labs Lab 04/28/16 1943  WBC 6.6  NEUTROABS 4.8  HGB 11.6*  HCT 34.4*  MCV 92.7  PLT 549   Basic Metabolic Panel:  Recent Labs Lab 04/28/16 1943  NA 131*  K 4.5  CL 94*  CO2 26  GLUCOSE 94  BUN 14  CREATININE 1.20  CALCIUM 9.2   GFR: Estimated Creatinine Clearance: 41 mL/min (by C-G  formula based on SCr of 1.2 mg/dL). Liver Function Tests: No results for input(s): AST, ALT, ALKPHOS, BILITOT, PROT, ALBUMIN in the last 168 hours. No results for input(s): LIPASE, AMYLASE in the last 168 hours. No results for input(s): AMMONIA in the last 168 hours. Coagulation Profile: No results for input(s): INR, PROTIME in the last 168 hours. Cardiac Enzymes: No results for input(s): CKTOTAL, CKMB, CKMBINDEX, TROPONINI in the last 168 hours. BNP (last 3 results) No results for input(s): PROBNP in the last 8760 hours. HbA1C: No results for input(s): HGBA1C in the last 72 hours. CBG: No results for input(s): GLUCAP in the last 168 hours. Lipid Profile: No results for input(s): CHOL, HDL, LDLCALC, TRIG, CHOLHDL, LDLDIRECT  in the last 72 hours. Thyroid Function Tests: No results for input(s): TSH, T4TOTAL, FREET4, T3FREE, THYROIDAB in the last 72 hours. Anemia Panel: No results for input(s): VITAMINB12, FOLATE, FERRITIN, TIBC, IRON, RETICCTPCT in the last 72 hours. Urine analysis:    Component Value Date/Time   COLORURINE YELLOW 04/28/2016 2207   APPEARANCEUR CLEAR 04/28/2016 2207   LABSPEC 1.016 04/28/2016 2207   PHURINE 6.0 04/28/2016 2207   GLUCOSEU NEGATIVE 04/28/2016 2207   HGBUR NEGATIVE 04/28/2016 2207   BILIRUBINUR NEGATIVE 04/28/2016 2207   KETONESUR NEGATIVE 04/28/2016 2207   PROTEINUR NEGATIVE 04/28/2016 2207   NITRITE NEGATIVE 04/28/2016 2207   LEUKOCYTESUR NEGATIVE 04/28/2016 2207   Sepsis Labs: _0 (procalcitonin:4,lacticidven:4) ) Recent Results (from the past 240 hour(s))  Blood culture (routine x 2)     Status: None (Preliminary result)   Collection Time: 04/28/16 10:20 PM  Result Value Ref Range Status   Specimen Description BLOOD LEFT ANTECUBITAL  Final   Special Requests   Final    BOTTLES DRAWN AEROBIC ONLY 5CC IN PEDIATRIC BOTTLE 2CC   Culture PENDING  Incomplete   Report Status PENDING  Incomplete     Radiological Exams on Admission: Dg  Chest 2 View  Result Date: 04/28/2016 CLINICAL DATA:  Status post fall, with confusion and concern for chest injury. Initial encounter. EXAM: CHEST  2 VIEW COMPARISON:  None. FINDINGS: The lungs are well-aerated and clear. There is no evidence of focal opacification, pleural effusion or pneumothorax. The heart is borderline normal in size. No acute osseous abnormalities are seen. Scattered calcification is seen along the lower thoracic and abdominal aorta. IMPRESSION: 1. No acute cardiopulmonary process seen. No displaced rib fractures identified. 2. Scattered aortic atherosclerosis. Electronically Signed   By: Garald Balding M.D.   On: 04/28/2016 21:18   Dg Elbow Complete Right  Result Date: 04/28/2016 CLINICAL DATA:  Status post fall, with right posterior elbow abrasion and pain. Initial encounter. EXAM: RIGHT ELBOW - COMPLETE 3+ VIEW COMPARISON:  None. FINDINGS: There is no evidence of fracture or dislocation. The visualized joint spaces are preserved. No significant joint effusion is identified. The soft tissues are unremarkable in appearance Dorsal soft tissue air and swelling is noted, reflecting underlying laceration. No radiopaque foreign bodies are seen. IMPRESSION: 1. No evidence of fracture or dislocation. 2. Dorsal soft tissue air and swelling noted, reflecting underlying laceration. Electronically Signed   By: Garald Balding M.D.   On: 04/28/2016 21:11   Ct Head Wo Contrast  Result Date: 04/28/2016 CLINICAL DATA:  Fall, confusion EXAM: CT HEAD WITHOUT CONTRAST CT CERVICAL SPINE WITHOUT CONTRAST TECHNIQUE: Multidetector CT imaging of the head and cervical spine was performed following the standard protocol without intravenous contrast. Multiplanar CT image reconstructions of the cervical spine were also generated. COMPARISON:  None. FINDINGS: CT HEAD FINDINGS Brain: No evidence of acute infarction, hemorrhage, extra-axial collection or mass lesion/mass effect. Global cortical and central  atrophy. Secondary mild ventriculomegaly. Extensive subcortical white matter and periventricular small vessel ischemic changes. Vascular: Mild intracranial atherosclerosis. Skull: Normal. Negative for fracture or focal lesion. Sinuses/Orbits: The visualized paranasal sinuses are essentially clear. The mastoid air cells are unopacified. Other: None. CT CERVICAL SPINE FINDINGS Alignment: Normal cervical lordosis. Skull base and vertebrae: No acute fracture. No primary bone lesion or focal pathologic process. Soft tissues and spinal canal: No prevertebral fluid or swelling. No visible canal hematoma. Disc levels: Mild degenerative changes, most prominent at C4-5, C5-6, and C6-7. Spinal canal is patent. Upper chest: Visualized lung apices are  clear. Other: Visualized thyroid is unremarkable. IMPRESSION: No evidence of acute intracranial abnormality. Atrophy with secondary ventriculomegaly. Extensive small vessel ischemic changes. No evidence of traumatic injury to the cervical spine. Mild multilevel degenerative changes. Electronically Signed   By: Julian Hy M.D.   On: 04/28/2016 20:56   Ct Cervical Spine Wo Contrast  Result Date: 04/28/2016 CLINICAL DATA:  Fall, confusion EXAM: CT HEAD WITHOUT CONTRAST CT CERVICAL SPINE WITHOUT CONTRAST TECHNIQUE: Multidetector CT imaging of the head and cervical spine was performed following the standard protocol without intravenous contrast. Multiplanar CT image reconstructions of the cervical spine were also generated. COMPARISON:  None. FINDINGS: CT HEAD FINDINGS Brain: No evidence of acute infarction, hemorrhage, extra-axial collection or mass lesion/mass effect. Global cortical and central atrophy. Secondary mild ventriculomegaly. Extensive subcortical white matter and periventricular small vessel ischemic changes. Vascular: Mild intracranial atherosclerosis. Skull: Normal. Negative for fracture or focal lesion. Sinuses/Orbits: The visualized paranasal sinuses are  essentially clear. The mastoid air cells are unopacified. Other: None. CT CERVICAL SPINE FINDINGS Alignment: Normal cervical lordosis. Skull base and vertebrae: No acute fracture. No primary bone lesion or focal pathologic process. Soft tissues and spinal canal: No prevertebral fluid or swelling. No visible canal hematoma. Disc levels: Mild degenerative changes, most prominent at C4-5, C5-6, and C6-7. Spinal canal is patent. Upper chest: Visualized lung apices are clear. Other: Visualized thyroid is unremarkable. IMPRESSION: No evidence of acute intracranial abnormality. Atrophy with secondary ventriculomegaly. Extensive small vessel ischemic changes. No evidence of traumatic injury to the cervical spine. Mild multilevel degenerative changes. Electronically Signed   By: Julian Hy M.D.   On: 04/28/2016 20:56     EKG: Independently reviewed.  Sinus rhythm, QTC 436, non specific T change.   Assessment/Plan Principal Problem:   Fall Active Problems:   Hyperlipidemia   Essential hypertension   AKI (acute kidney injury) ([place])   Dementia   Cellulitis of right elbow   Head injury   Failure to thrive in adult   Depression   Hypothyroidism   Fall: Likely multifactorial etiology, including generalized weakness, deconditioning, ongoing infection in left elbow, worsening renal function, dehydration. No focal neurological findings, less likely to have stroke. CT head and CT C-spine is negative for acute abnormalities. Pt lives alone. He cannot take care of himself any more at home. Will need placement.   -will admit to tele bed as inpt -pt/OT -Check orthostatic status -IVF: NS 100 cc/h -consult SW and CM  Cellulitis and wound infection of right elbow: pt is not septic. Lactic acid is normal. No leukocytosis. -IV vancomycin - f/u blood culture x 2 -Wound care consult. -check ESR and Crp  AKI: Cre 1.20, BUN 14.  Likely due to prerenal secondary to dehydration and continuation of  diruetics. - IVF as above - Follow up renal function by BMP - Hold Diuretics, HCTZ  Failure to thrive in adult and protein calorie malnutrition-mild -Nutrition consult -PT/OT  HTN: -hold HCTZ due to AKI -Continue amlodipine  HLD:  -Continue home medications: lipitor  Depression -on zoloft  Hypothyroidism: Last TSH was not on record -Continue home Synthroid -Check TSH    DVT ppx: SCD Code Status: DNR (per patient's nephew, friends and neighbors, they are working on [place] and pt wanted to be DNR) Family Communication:  Yes, patient's  nephew, friends and neighbors at bed side Disposition Plan:  Anticipate discharge back to previous home environment Consults called:  none Admission status: Inpatient/tele    Date of Service 04/29/2016  Ivor Costa [facility] Pager [phone]  If 7PM-7AM, please contact night-coverage [email or web address] Password [facility] 04/29/2016, 6:55 AM

## 2016-04-29 ENCOUNTER — Encounter (HOSPITAL_COMMUNITY): Payer: Self-pay | Admitting: General Practice

## 2016-04-29 DIAGNOSIS — F0390 Unspecified dementia without behavioral disturbance: Secondary | ICD-10-CM | POA: Diagnosis present

## 2016-04-29 DIAGNOSIS — F32A Depression, unspecified: Secondary | ICD-10-CM | POA: Diagnosis present

## 2016-04-29 DIAGNOSIS — L03113 Cellulitis of right upper limb: Secondary | ICD-10-CM | POA: Diagnosis present

## 2016-04-29 DIAGNOSIS — R627 Adult failure to thrive: Secondary | ICD-10-CM | POA: Diagnosis present

## 2016-04-29 DIAGNOSIS — N179 Acute kidney failure, unspecified: Secondary | ICD-10-CM | POA: Diagnosis present

## 2016-04-29 DIAGNOSIS — E039 Hypothyroidism, unspecified: Secondary | ICD-10-CM | POA: Diagnosis present

## 2016-04-29 DIAGNOSIS — F329 Major depressive disorder, single episode, unspecified: Secondary | ICD-10-CM | POA: Diagnosis present

## 2016-04-29 DIAGNOSIS — S0990XA Unspecified injury of head, initial encounter: Secondary | ICD-10-CM | POA: Diagnosis present

## 2016-04-29 LAB — CBC
HEMATOCRIT: 32.7 % — AB (ref 39.0–52.0)
Hemoglobin: 11 g/dL — ABNORMAL LOW (ref 13.0–17.0)
MCH: 31.3 pg (ref 26.0–34.0)
MCHC: 33.6 g/dL (ref 30.0–36.0)
MCV: 93.2 fL (ref 78.0–100.0)
PLATELETS: 229 10*3/uL (ref 150–400)
RBC: 3.51 MIL/uL — ABNORMAL LOW (ref 4.22–5.81)
RDW: 13.9 % (ref 11.5–15.5)
WBC: 6.1 10*3/uL (ref 4.0–10.5)

## 2016-04-29 LAB — SEDIMENTATION RATE: Sed Rate: 14 mm/hr (ref 0–16)

## 2016-04-29 LAB — BASIC METABOLIC PANEL
Anion gap: 11 (ref 5–15)
BUN: 11 mg/dL (ref 6–20)
CHLORIDE: 98 mmol/L — AB (ref 101–111)
CO2: 24 mmol/L (ref 22–32)
CREATININE: 1.05 mg/dL (ref 0.61–1.24)
Calcium: 8.6 mg/dL — ABNORMAL LOW (ref 8.9–10.3)
GFR calc Af Amer: >60 mL/min (ref >60)
GFR calc non Af Amer: >60 mL/min (ref >60)
GLUCOSE: 74 mg/dL (ref 65–99)
POTASSIUM: 3.6 mmol/L (ref 3.5–5.1)
Sodium: 133 mmol/L — ABNORMAL LOW (ref 135–145)

## 2016-04-29 LAB — TSH: TSH: 64.685 u[IU]/mL — AB (ref 0.350–4.500)

## 2016-04-29 LAB — C-REACTIVE PROTEIN: CRP: <0.8 mg/dL (ref <1.0)

## 2016-04-29 MED ORDER — HEPARIN SODIUM (PORCINE) 5000 UNIT/ML IJ SOLN
5000.0000 [IU] | Freq: Three times a day (TID) | INTRAMUSCULAR | Status: DC
  Administered 2016-04-29 – 2016-05-03 (×12): 5000 [IU] via SUBCUTANEOUS
  Filled 2016-04-29 (×11): qty 1

## 2016-04-29 MED ORDER — SERTRALINE HCL 25 MG PO TABS
25.0000 mg | ORAL_TABLET | Freq: Every day | ORAL | Status: DC
  Administered 2016-04-29 – 2016-05-03 (×5): 25 mg via ORAL
  Filled 2016-04-29 (×5): qty 1

## 2016-04-29 MED ORDER — HALOPERIDOL LACTATE 5 MG/ML IJ SOLN
2.0000 mg | Freq: Four times a day (QID) | INTRAMUSCULAR | Status: DC | PRN
  Administered 2016-04-29 – 2016-05-01 (×3): 2 mg via INTRAVENOUS
  Filled 2016-04-29 (×4): qty 1

## 2016-04-29 MED ORDER — SODIUM CHLORIDE 0.9 % IV SOLN: INTRAVENOUS | Status: AC

## 2016-04-29 MED ORDER — SODIUM CHLORIDE 0.9 % IV SOLN
INTRAVENOUS | Status: DC
  Administered 2016-04-29: 02:00:00 via INTRAVENOUS

## 2016-04-29 MED ORDER — AMLODIPINE BESYLATE 5 MG PO TABS
2.5000 mg | ORAL_TABLET | Freq: Every day | ORAL | Status: DC
  Administered 2016-04-29 – 2016-05-03 (×5): 2.5 mg via ORAL
  Filled 2016-04-29 (×5): qty 1

## 2016-04-29 MED ORDER — ONDANSETRON HCL 4 MG/2ML IJ SOLN: 4.0000 mg | Freq: Four times a day (QID) | INTRAMUSCULAR | Status: DC | PRN

## 2016-04-29 MED ORDER — SERTRALINE HCL 25 MG PO TABS: 25.0000 mg | ORAL_TABLET | Freq: Every day | ORAL | Status: DC

## 2016-04-29 MED ORDER — SODIUM CHLORIDE 0.9% FLUSH
3.0000 mL | Freq: Two times a day (BID) | INTRAVENOUS | Status: DC
  Administered 2016-04-29 – 2016-05-03 (×8): 3 mL via INTRAVENOUS

## 2016-04-29 MED ORDER — ENSURE ENLIVE PO LIQD
237.0000 mL | Freq: Two times a day (BID) | ORAL | Status: DC
  Administered 2016-05-01 – 2016-05-03 (×4): 237 mL via ORAL

## 2016-04-29 MED ORDER — LEVOTHYROXINE SODIUM 50 MCG PO TABS
50.0000 ug | ORAL_TABLET | Freq: Every day | ORAL | Status: DC
  Filled 2016-04-29: qty 1

## 2016-04-29 MED ORDER — LEVOTHYROXINE SODIUM 100 MCG IV SOLR
50.0000 ug | Freq: Every day | INTRAVENOUS | Status: DC
  Administered 2016-04-30 – 2016-05-03 (×4): 50 ug via INTRAVENOUS
  Filled 2016-04-29 (×5): qty 5

## 2016-04-29 MED ORDER — HYDRALAZINE HCL 20 MG/ML IJ SOLN
5.0000 mg | INTRAMUSCULAR | Status: DC | PRN
  Administered 2016-05-01: 5 mg via INTRAVENOUS
  Filled 2016-04-29: qty 1

## 2016-04-29 MED ORDER — VANCOMYCIN HCL 10 G IV SOLR
1250.0000 mg | INTRAVENOUS | Status: DC
  Administered 2016-04-29 – 2016-04-30 (×2): 1250 mg via INTRAVENOUS
  Filled 2016-04-29 (×2): qty 1250

## 2016-04-29 MED ORDER — COLLAGENASE 250 UNIT/GM EX OINT
TOPICAL_OINTMENT | Freq: Every day | CUTANEOUS | Status: DC
  Administered 2016-05-01 – 2016-05-03 (×3): via TOPICAL
  Filled 2016-04-29: qty 30

## 2016-04-29 MED ORDER — ASPIRIN EC 81 MG PO TBEC
81.0000 mg | DELAYED_RELEASE_TABLET | Freq: Every day | ORAL | Status: DC
  Administered 2016-04-29 – 2016-05-03 (×5): 81 mg via ORAL
  Filled 2016-04-29 (×5): qty 1

## 2016-04-29 MED ORDER — LORAZEPAM 2 MG/ML IJ SOLN
1.0000 mg | Freq: Once | INTRAMUSCULAR | Status: AC
  Administered 2016-04-29: 1 mg via INTRAVENOUS
  Filled 2016-04-29: qty 1

## 2016-04-29 MED ORDER — ONDANSETRON HCL 4 MG PO TABS: 4.0000 mg | ORAL_TABLET | Freq: Four times a day (QID) | ORAL | Status: DC | PRN

## 2016-04-29 MED ORDER — LORAZEPAM 2 MG/ML IJ SOLN
1.0000 mg | Freq: Once | INTRAMUSCULAR | Status: AC
  Administered 2016-04-30: 1 mg via INTRAVENOUS
  Filled 2016-04-29: qty 1

## 2016-04-29 MED ORDER — ATORVASTATIN CALCIUM 10 MG PO TABS
10.0000 mg | ORAL_TABLET | Freq: Every day | ORAL | Status: DC
  Administered 2016-04-29 – 2016-05-03 (×5): 10 mg via ORAL
  Filled 2016-04-29 (×5): qty 1

## 2016-04-29 MED ORDER — ZOLPIDEM TARTRATE 5 MG PO TABS: 5.0000 mg | ORAL_TABLET | Freq: Every evening | ORAL | Status: DC | PRN

## 2016-04-29 NOTE — NC FL2 (Signed)
[place] MEDICAID FL2 LEVEL OF CARE SCREENING TOOL     IDENTIFICATION  Patient Name: Benjamin Banks Birthdate: [birth year] Sex: male Admission Date (Current Location): 04/28/2016  [facility] [place] Number:  Producer, television/film/video and Address:  [facility], [address], [place], [place] [address]      Provider Number: [id number]  Attending Physician Name and Address:  Leroy Sea, MD  Relative Name and Phone Number:  Melvyn Neth, [phone]    Current Level of Care: Hospital Recommended Level of Care: Skilled Nursing Facility Prior Approval Number:    Date Approved/Denied:   PASRR Number: [id number]  Discharge Plan: SNF    Current Diagnoses: Patient Active Problem List   Diagnosis Date Noted  . AKI (acute kidney injury) (HCC) 04/29/2016  . Dementia 04/29/2016  . Cellulitis of right elbow 04/29/2016  . Failure to thrive in adult 04/29/2016  . Depression 04/29/2016  . Hypothyroidism 04/29/2016  . Head injury   . Fall 04/28/2016  . Exertional chest pain 01/11/2014  . Angina decubitus (HCC) 01/11/2014  . Hyperlipidemia 01/11/2014  . Essential hypertension 01/11/2014    Orientation RESPIRATION BLADDER Height & Weight     Self  Normal Continent Weight: 150 lb 4.8 oz (68.2 kg) Height:  5\' 8"  (172.7 cm)  BEHAVIORAL SYMPTOMS/MOOD NEUROLOGICAL BOWEL NUTRITION STATUS      Continent Diet (Heart Healthy)  AMBULATORY STATUS COMMUNICATION OF NEEDS Skin   Limited Assist Verbally Normal                       Personal Care Assistance Level of Assistance  Bathing, Feeding, Dressing Bathing Assistance: Limited assistance Feeding assistance: Independent Dressing Assistance: Limited assistance     Functional Limitations Info  Sight, Hearing, Speech Sight Info: Adequate Hearing Info: Impaired Speech Info: Adequate    SPECIAL CARE FACTORS FREQUENCY  PT (By licensed PT)     PT Frequency: 5x week OT Frequency: 5x week            Contractures Contractures Info: Not present    Additional Factors Info  Code Status Code Status Info: DNR             Current Medications (04/29/2016):  This is the current hospital active medication list Current Facility-Administered Medications  Medication Dose Route Frequency Provider Last Rate Last Dose  . 0.9 %  sodium chloride infusion   Intravenous Continuous Lorretta Harp, MD 100 mL/hr at 04/29/16 0155    . amLODipine (NORVASC) tablet 2.5 mg  2.5 mg Oral Daily Lorretta Harp, MD   2.5 mg at 04/29/16 0943  . aspirin EC tablet 81 mg  81 mg Oral Daily Lorretta Harp, MD   81 mg at 04/29/16 0943  . atorvastatin (LIPITOR) tablet 10 mg  10 mg Oral Daily Lorretta Harp, MD   10 mg at 04/29/16 0943  . hydrALAZINE (APRESOLINE) injection 5 mg  5 mg Intravenous Q2H PRN Lorretta Harp, MD      . levothyroxine (SYNTHROID, LEVOTHROID) tablet 50 mcg  50 mcg Oral QAC breakfast Lorretta Harp, MD      . ondansetron [facility]) tablet 4 mg  4 mg Oral Q6H PRN Lorretta Harp, MD       Or  . ondansetron [facility]) injection 4 mg  4 mg Intravenous Q6H PRN Lorretta Harp, MD      . sertraline (ZOLOFT) tablet 25 mg  25 mg Oral Daily Lorretta Harp, MD   25 mg at 04/29/16  [id number]  . sodium chloride flush (NS) 0.9 % injection 3 mL  3 mL Intravenous Q12H Lorretta HarpXilin Niu, MD   3 mL at 04/29/16 0155  . vancomycin (VANCOCIN) 1,250 mg in sodium chloride 0.9 % 250 mL IVPB  1,250 mg Intravenous Q24H Crystal Jacqualin CombesS Robertson, RPH      . zolpidem (AMBIEN) tablet 5 mg  5 mg Oral QHS PRN Lorretta HarpXilin Niu, MD         Discharge Medications: Please see discharge summary for a list of discharge medications.  Relevant Imaging Results:  Relevant Lab Results:   Additional Information SS#[id number]  Althea CharonAshley C Delayla Hoffmaster, LCSW

## 2016-04-29 NOTE — Progress Notes (Addendum)
Initial Nutrition Assessment  DOCUMENTATION CODES:   Not applicable  INTERVENTION:    Ensure Enlive po BID, each supplement provides 350 kcal and 20 grams of protein  NUTRITION DIAGNOSIS:   Inadequate oral intake related to  (limited appetite) as evidenced by meal completion < 50%  GOAL:   Patient will meet greater than or equal to 90% of their needs  MONITOR:   PO intake, Supplement acceptance, Labs, Weight trends, I & O's  REASON FOR ASSESSMENT:   Consult Assessment of nutrition requirement/status  ASSESSMENT:   83 y.o. Male with PMH significant of dementia, peripheral neuropathy, hypothyroidism, Gilbert's syndrome, BPH, anemia, vertigo, hypertension, hyperlipidemia, GERD, depression, PMR, who presents with failure to thrive, fall, right elbow wound infection.  RD spoke with pt's maternal nephew, Elijah Birkom. Reports pt has many neighbors and/or friends that "check on" pt; also cook and bring him meals. Elijah Birkom also states pt has been progressively losing weight.  Unable to quantify amount and/or time frame.   Pt does drink Boost and/or Ensure nutrition supplements at home. Medications reviewed and include Zofran and ABX. PO intake variable at 10-50% per flowsheets. Labs reviewed.  Sodium 133 (L).   Unable to complete Nutrition-Focused physical exam at this time.   Diet Order:  Diet Heart Room service appropriate? Yes; Fluid consistency: Thin  Skin:  Wound (see comment) (cellulitis to R elbow )  Last BM:  N/A  Height:   Ht Readings from Last 1 Encounters:  04/29/16 5\' 8"  (1.727 m)    Weight:   Wt Readings from Last 1 Encounters:  04/29/16 150 lb 4.8 oz (68.2 kg)    Ideal Body Weight:  70 kg  BMI:  Body mass index is 22.85 kg/m.  Estimated Nutritional Needs:   Kcal:  1500-1700  Protein:  70-80 gm  Fluid:  1.5-1.7 L  EDUCATION NEEDS:   No education needs identified at this time  Maureen ChattersKatie Taci Sterling, RD, LDN Pager #: [phone] After-Hours Pager #:  [phone]

## 2016-04-29 NOTE — Care Management Note (Signed)
Case Management Note Donn PieriniKristi Denson Niccoli RN, BSN Unit 2W-Case Manager [phone]  Patient Details  Name: Benjamin Banks MRN: [id number] Date of Birth: 04-Nov-1925  Subjective/Objective:  Pt admitted with fall from home                   Action/Plan: PTA pt lived at home alone, PT recommendations for SNF, spoke with pt's niece at bedside along with pt who is agreeable at this time to rehab "if that is what is needed"- niece would like to speak with CSW- have notified CSW that family is at bedside and pt's need for possible placement.   Expected Discharge Date:                  Expected Discharge Plan:  Skilled Nursing Facility  In-House Referral:  Clinical Social Work  Discharge planning Services  CM Consult  Post Acute Care Choice:  NA Choice offered to:  NA  DME Arranged:    DME Agency:     HH Arranged:    HH Agency:     Status of Service:  In process, will continue to follow  If discussed at Long Length of Stay Meetings, dates discussed:    Discharge Disposition:   Additional Comments:  Darrold SpanWebster, Bernardo Brayman Hall, RN 04/29/2016, 10:46 AM

## 2016-04-29 NOTE — ED Notes (Signed)
Entered room and pt pulled all lines out of his fluids, same was running on the ground. UNKNOWN HOW MUCH VANCOMYCIN PT ACTUALLY RECEIVED.

## 2016-04-29 NOTE — Clinical Social Work Note (Signed)
Clinical Social Work Assessment  Patient Details  Name: Benjamin Banks MRN: [id number] Date of Birth: [birth year]  Date of referral:  04/29/16               Reason for consult:  Discharge Planning                Permission sought to share information with:  Family Supports Permission granted to share information::  Yes, Verbal Permission Granted  Name::     Melvyn Nethhomas Garrett  Agency::     Relationship::  nephew  Contact Information:  [phone]  Housing/Transportation Living arrangements for the past 2 months:  Single Family Home Source of Information:  Other (Comment Required) (information came from [facility]) Patient Interpreter Needed:  None Criminal Activity/Legal Involvement Pertinent to Current Situation/Hospitalization:  No - Comment as needed Significant Relationships:    Lives with:  Other (Comment) (extended family) Do you feel safe going back to the place where you live?  Yes Need for family participation in patient care:  Yes (Comment)  Care giving concerns:  Patient had extended family present in the room  Social Worker assessment / plan:Patient was unable to participate in assessment with CSW because patient was confused. Melvyn Nethhomas Garrett one of patients POA answered question on his behave. Maisie Fushomas stated patients lives at home by himself and family will be unable to provided 24/hr supervision for patient. Maisie Fushomas is agreeable to SNF placement and would prefer [facility] as he has his mother in law at the facility. CSW to complete necessary paperwork and initiate SNF search on patient behalf. CSW remains available for support and to facilitate patient discharge needs once medically ready.   Employment status:  Retired Health and safety inspectornsurance information:  Medicare PT Recommendations:  Skilled Nursing Facility Information / Referral to community resources:  Skilled Nursing Facility  Patient/Family's Response to care:  Family verbalized appreciation and understanding for CSW role and  involvement in care.  Patient/Family's Understanding of and Emotional Response to Diagnosis, Current Treatment, and Prognosis: Family with good understanding of current medical state and limitations around most recent hospitalization.  Emotional Assessment Appearance:  Appears stated age Attitude/Demeanor/Rapport:  Unable to Assess Affect (typically observed):  Pleasant Orientation:  Oriented to Self Alcohol / Substance use:  Not Applicable Psych involvement (Current and /or in the community):  No (Comment)  Discharge Needs  Concerns to be addressed:  No discharge needs identified Readmission within the last 30 days:  No Current discharge risk:  None Barriers to Discharge:  No Barriers Identified   Althea Charonshley C Berenis Corter, LCSW 04/29/2016, 12:12 PM

## 2016-04-29 NOTE — Evaluation (Signed)
Physical Therapy Evaluation Patient Details Name: Benjamin Banks MRN: [id number] DOB: June 03, 1925 Today's Date: 04/29/2016   History of Present Illness  83 yo admitted from home after fall with neighbors concerned for his safety as he lives alone with increasing confusion. PMHx:dementia, neuropathy, vertigo, HTn, HLD  Clinical Impression  Pt pleasant but initially resistant to mobility stating that it is early but unable to state what time he usually gets up for breakfast. Pt disoriented to time, place and situation. Pt lives alone but stating he lives with family and when asked who his family is he stated "lots of girls". Pt impulsive and able to move when internally directed but not by externally cueing. Pt with decreased strength, cognition, safety, balance and gait who is not safe to be home alone and will benefit from acute therapy to maximize mobility, function and gait to decrease fall risk and burden of care.     Follow Up Recommendations SNF;Supervision/Assistance - 24 hour    Equipment Recommendations  None recommended by PT    Recommendations for Other Services       Precautions / Restrictions Precautions Precautions: Fall      Mobility  Bed Mobility Overal bed mobility: Needs Assistance Bed Mobility: Supine to Sit     Supine to sit: Max assist     General bed mobility comments: pt initially not assisting due to stating "I need to rest" with assist for legs and trunk to achieve sitting from supine  Transfers Overall transfer level: Needs assistance   Transfers: Sit to/from Stand Sit to Stand: Min assist         General transfer comment: pt would not rise on command from bed x 3 trials with 2 person assist. Once chair positioned directly beside pt he stood and pivoted with min assist and hands on chair. Once in chair pt needing to void and min assist to rise with cues from chair and toilet with assist for RW and IV position with +1 for safety as pt  impulsive  Ambulation/Gait Ambulation/Gait assistance: Min assist Ambulation Distance (Feet): 10 Feet Assistive device: Rolling walker (2 wheeled) Gait Pattern/deviations: Step-through pattern;Trunk flexed;Shuffle   Gait velocity interpretation: Below normal speed for age/gender General Gait Details: cues for posture and position in RW x 2 trials   Stairs            Wheelchair Mobility    Modified Rankin (Stroke Patients Only)       Balance Overall balance assessment: Needs assistance   Sitting balance-Leahy Scale: Fair       Standing balance-Leahy Scale: Poor                               Pertinent Vitals/Pain Pain Assessment: No/denies pain    Home Living Family/patient expects to be discharged to:: Private residence Living Arrangements: Alone Available Help at Discharge: Friend(s);Available PRN/intermittently Type of Home: House Home Access: Stairs to enter   Entrance Stairs-Number of Steps: 3 Home Layout: One level Home Equipment: Walker - 2 wheels Additional Comments: pt stating he lives in an apartment but nephew arrived to provide correct home setup and PLOF. Pt has RW was refusing to use it, hasn't been bathing, multiple falls at home    Prior Function Level of Independence: Independent               Hand Dominance        Extremity/Trunk Assessment   Upper Extremity  Assessment Upper Extremity Assessment: Generalized weakness    Lower Extremity Assessment Lower Extremity Assessment: Generalized weakness    Cervical / Trunk Assessment Cervical / Trunk Assessment: Kyphotic  Communication   Communication: No difficulties  Cognition Arousal/Alertness: Awake/alert Behavior During Therapy: Impulsive Overall Cognitive Status: Impaired/Different from baseline Area of Impairment: Orientation;Attention;Memory;Following commands;Safety/judgement;Awareness;Problem solving Orientation Level: Disoriented  to;Place;Time;Situation Current Attention Level: Sustained Memory: Decreased short-term memory Following Commands: Follows one step commands inconsistently;Follows one step commands with increased time Safety/Judgement: Decreased awareness of safety;Decreased awareness of deficits Awareness: Intellectual Problem Solving: Slow processing;Decreased initiation;Difficulty sequencing;Requires verbal cues;Requires tactile cues      General Comments      Exercises     Assessment/Plan    PT Assessment Patient needs continued PT services  PT Problem List Decreased strength;Decreased mobility;Decreased safety awareness;Decreased activity tolerance;Decreased cognition;Decreased balance;Decreased knowledge of use of DME;Decreased skin integrity;Decreased knowledge of precautions;Decreased coordination       PT Treatment Interventions DME instruction;Therapeutic activities;Cognitive remediation;Gait training;Therapeutic exercise;Patient/family education;Balance training;Functional mobility training    PT Goals (Current goals can be found in the Care Plan section)  Acute Rehab PT Goals Patient Stated Goal: get some rest PT Goal Formulation: With patient Time For Goal Achievement: 05/13/16 Potential to Achieve Goals: Fair    Frequency Min 2X/week   Barriers to discharge Decreased caregiver support pt lives alone with no immediate family    Co-evaluation               End of Session Equipment Utilized During Treatment: Gait belt Activity Tolerance: Patient tolerated treatment well Patient left: in chair;with call bell/phone within reach;with nursing/sitter in room;with chair alarm set Nurse Communication: Mobility status;Precautions PT Visit Diagnosis: Unsteadiness on feet (R26.81);History of falling (Z91.81);Difficulty in walking, not elsewhere classified (R26.2);Muscle weakness (generalized) (M62.81)         Time: 5784-6962 PT Time Calculation (min) (ACUTE ONLY): 31  min   Charges:   PT Evaluation $PT Eval Moderate Complexity: 1 Procedure PT Treatments $Therapeutic Activity: 8-22 mins   PT G Codes:         Andrw Mcguirt B Rakeb Kibble 2016-05-20, 9:59 AM  Delaney Meigs, PT [phone]

## 2016-04-29 NOTE — Clinical Social Work Placement (Signed)
   CLINICAL SOCIAL WORK PLACEMENT  NOTE  Date:  04/29/2016  Patient Details  Name: Benjamin Banks MRN: [id number] Date of Birth: October 31, 1925  Clinical Social Work is seeking post-discharge placement for this patient at the Skilled  Nursing Facility level of care (*CSW will initial, date and re-position this form in  chart as items are completed):  Yes   Patient/family provided with [facility] Clinical Social Work Department's list of facilities offering this level of care within the geographic area requested by the patient (or if unable, by the patient's family).  Yes   Patient/family informed of their freedom to choose among providers that offer the needed level of care, that participate in Medicare, Medicaid or managed care program needed by the patient, have an available bed and are willing to accept the patient.  Yes   Patient/family informed of [facility]'s ownership interest [facility] and [facility], as well as of the fact that they are under no obligation to receive care at these facilities.  PASRR submitted to EDS on       PASRR number received on       Existing PASRR number confirmed on       FL2 transmitted to all facilities in geographic area requested by pt/family on       FL2 transmitted to all facilities within larger geographic area on       Patient informed that his/her managed care company has contracts with or will negotiate with certain facilities, including the following:            Patient/family informed of bed offers received.  Patient chooses bed at       Physician recommends and patient chooses bed at      Patient to be transferred to   on  .  Patient to be transferred to facility by       Patient family notified on   of transfer.  Name of family member notified:        PHYSICIAN Please sign FL2     Additional Comment:    _______________________________________________ Althea CharonAshley C Kinnedy Mongiello, LCSW 04/29/2016, 12:22 PM

## 2016-04-29 NOTE — Plan of Care (Signed)
Problem: Safety: Goal: Ability to remain free from injury will improve Outcome: Progressing Safety sitter at bedside   

## 2016-04-29 NOTE — Progress Notes (Signed)
Patient is very confused trying to get out of bed and setting bed alarm off cont. Pulling Tele. Off. Call Neomia DearUna his niece and he spoke with her on the phone and she is aware of his confusion,. And that Ativan was order to help him tonight. K. Schorr N.P. aware and order Ativan 1 mg I.V. One time dose

## 2016-04-29 NOTE — Consult Note (Signed)
WOC Nurse wound consult note Reason for Consult:Cellulitis to right elbow from fall at home.   Scabbed hematoma to top of head from fall, dry, scabbed and intact.  Wound type:Trauma from fall at home. Treated with vancomycin for infection Pressure Injury POA: N/A Measurement:Head:  2 cm x 2 cm scabbed, dry lesion.  Will leave open to air. If begins draining, will have to clip surrounding hair and treat topically.  Right elbow 0.5 cm nonintact area with erythema present circumferentially, extending 0.5 cm  Wound bed:100% slough to right elbow.  Drainage (amount, consistency, odor) minimal serosanguinous to elbow.  Will begin enzymatic debridement.  Periwound:erythema to elbow.  Dressing procedure/placement/frequency:Cleanse wound to right elbow with NS and pat dry.  Cover wound bed with Santyl and NS moist gauze.  Cover with kerlix and tape.  Change daily.  Leave head wound open.  Reconsult if it begins to drain.  Will not follow at this time.  Please re-consult if needed.  Maple HudsonKaren Jil Penland RN BSN CWON Pager [phone]

## 2016-04-29 NOTE — Progress Notes (Signed)
Pt frequently getting out of bed. Unsteady on his feet. Pt oriented to self. Continue to keep reorienting pt. Bed and chair alarm on. Will continue to monitor.

## 2016-04-29 NOTE — Progress Notes (Signed)
PROGRESS NOTE                                                                                                                                                                                                             Patient Demographics:    Benjamin Banks, is a 83 y.o. male, DOB - [birth year], ZOX:[id number]  Admit date - 04/28/2016   Admitting Physician Lorretta Harp, MD  Outpatient Primary MD for the patient is Ginette Otto, MD  LOS - 1  Chief Complaint  Patient presents with  . Fall       Brief Narrative Benjamin Banks is a 83 y.o. male with medical history significant of dementia, peripheral neuropathy, hypothyroidism, Gilbert's syndrome, BPH, anemia, vertigo, hypertension, hyperlipidemia, GERD, depression, PMR, who presents with failure to thrive, fall, right elbow wound infection   Subjective:    [name] today has, No headache, No chest pain, No abdominal pain - No Nausea, No new weakness tingling or numbness, No Cough - SOB.     Assessment  & Plan :     1.Mechanical fall due to generalized weakness and deconditioning most likely due to underlying severe hypothyroidism causing a right elbow laceration and cellulitis. He is not septic, currently on IV vancomycin, continue wound care, x-ray rules out any fracture, CT scan of head and C-spine unremarkable. We'll continue empiric IV vancomycin and follow cultures. PT evaluation, may require placement. Head laceration stable.  2. Severe hypothyroidism. TSH greater than 65, change Synthroid to IV form and double the dose for a few days and monitor.  3. Dementia. Remains at risk for delirium. Avoid benzodiazepines and narcotics. Supportive care.  4. Dyslipidemia. On statin continue.  5. Hypertension currently stable on Norvasc.  6. Depression. On Zoloft continue.  7. Generalized weakness, failure to thrive, moderate protein calorie malnutrition.  Possibly all due to severe hypothyroidism. Treatment as an #2 above along with supportive care and protein supplementation.    Diet : Diet Heart Room service appropriate? Yes; Fluid consistency: Thin    Family Communication  : None  Code Status :  DNR  Disposition Plan  :  TBD  Consults  :  None  Procedures  :  CT head and C-spine. Nonacute.  DVT Prophylaxis  :   Heparin   Lab Results  Component Value Date   PLT  229 04/29/2016    Inpatient Medications  Scheduled Meds: . amLODipine  2.5 mg Oral Daily  . aspirin EC  81 mg Oral Daily  . atorvastatin  10 mg Oral Daily  . levothyroxine  50 mcg Oral QAC breakfast  . sertraline  25 mg Oral Daily  . sodium chloride flush  3 mL Intravenous Q12H  . vancomycin  1,250 mg Intravenous Q24H   Continuous Infusions: . sodium chloride 100 mL/hr at 04/29/16 0155   PRN Meds:.hydrALAZINE, ondansetron **OR** ondansetron (ZOFRAN) IV, zolpidem  Antibiotics  :    Anti-infectives    Start     Dose/Rate Route Frequency Ordered Stop   04/29/16 2200  vancomycin (VANCOCIN) IVPB 750 mg/150 ml premix  Status:  Discontinued     750 mg 150 mL/hr over 60 Minutes Intravenous Every 24 hours 04/28/16 2228 04/29/16 1054   04/29/16 2200  vancomycin (VANCOCIN) 1,250 mg in sodium chloride 0.9 % 250 mL IVPB     1,250 mg 166.7 mL/hr over 90 Minutes Intravenous Every 24 hours 04/29/16 1054     04/28/16 2230  vancomycin (VANCOCIN) IVPB 1000 mg/200 mL premix     1,000 mg 200 mL/hr over 60 Minutes Intravenous  Once 04/28/16 2228 04/29/16 0114         Objective:   Vitals:   04/29/16 0150 04/29/16 0431 04/29/16 0943 04/29/16 1259  BP: (!) 122/58 (!) 143/67 138/86 (!) 148/65  Pulse: 77 97  (!) 55  Resp: 20 18  18   Temp: 98.1 F (36.7 C) 97.5 F (36.4 C)  97.7 F (36.5 C)  TempSrc: Oral Axillary  Oral  SpO2: 100% 96%  99%  Weight:      Height:        Wt Readings from Last 3 Encounters:  04/29/16 68.2 kg (150 lb 4.8 oz)  01/11/14 72.4 kg (159  lb 9.6 oz)     Intake/Output Summary (Last 24 hours) at 04/29/16 1349 Last data filed at 04/29/16 1259  Gross per 24 hour  Intake              476 ml  Output              575 ml  Net              -99 ml     Physical Exam  Awake , Pleasantly confused, small occipital head laceration, No new F.N deficits, Normal affect [place].AT,PERRAL Supple Neck,No JVD, No cervical lymphadenopathy appriciated.  Symmetrical Chest wall movement, Good air movement bilaterally, CTAB RRR,No Gallops,Rubs or new Murmurs, No Parasternal Heave +ve B.Sounds, Abd Soft, No tenderness, No organomegaly appriciated, No rebound - guarding or rigidity. No Cyanosis, Clubbing or edema, No new Rash or bruise , R elbow under bandage    Data Review:    CBC  Recent Labs Lab 04/28/16 1943 04/29/16 0656  WBC 6.6 6.1  HGB 11.6* 11.0*  HCT 34.4* 32.7*  PLT 236 229  MCV 92.7 93.2  MCH 31.3 31.3  MCHC 33.7 33.6  RDW 13.9 13.9  LYMPHSABS 1.0  --   MONOABS 0.7  --   EOSABS 0.1  --   BASOSABS 0.0  --     Chemistries   Recent Labs Lab 04/28/16 1943 04/29/16 0656  NA 131* 133*  K 4.5 3.6  CL 94* 98*  CO2 26 24  GLUCOSE 94 74  BUN 14 11  CREATININE 1.20 1.05  CALCIUM 9.2 8.6*   ------------------------------------------------------------------------------------------------------------------ No results for input(s): CHOL, HDL,  LDLCALC, TRIG, CHOLHDL, LDLDIRECT in the last 72 hours.  No results found for: HGBA1C ------------------------------------------------------------------------------------------------------------------  Recent Labs  04/29/16 0656  TSH 64.685*   ------------------------------------------------------------------------------------------------------------------ No results for input(s): VITAMINB12, FOLATE, FERRITIN, TIBC, IRON, RETICCTPCT in the last 72 hours.  Coagulation profile No results for input(s): INR, PROTIME in the last 168 hours.  No results for input(s): DDIMER in  the last 72 hours.  Cardiac Enzymes No results for input(s): CKMB, TROPONINI, MYOGLOBIN in the last 168 hours.  Invalid input(s): CK ------------------------------------------------------------------------------------------------------------------ No results found for: BNP  Micro Results Recent Results (from the past 240 hour(s))  Blood culture (routine x 2)     Status: None (Preliminary result)   Collection Time: 04/28/16 10:20 PM  Result Value Ref Range Status   Specimen Description BLOOD LEFT ANTECUBITAL  Final   Special Requests   Final    BOTTLES DRAWN AEROBIC ONLY 5CC IN PEDIATRIC BOTTLE 2CC   Culture PENDING  Incomplete   Report Status PENDING  Incomplete    Radiology Reports Dg Chest 2 View  Result Date: 04/28/2016 CLINICAL DATA:  Status post fall, with confusion and concern for chest injury. Initial encounter. EXAM: CHEST  2 VIEW COMPARISON:  None. FINDINGS: The lungs are well-aerated and clear. There is no evidence of focal opacification, pleural effusion or pneumothorax. The heart is borderline normal in size. No acute osseous abnormalities are seen. Scattered calcification is seen along the lower thoracic and abdominal aorta. IMPRESSION: 1. No acute cardiopulmonary process seen. No displaced rib fractures identified. 2. Scattered aortic atherosclerosis. Electronically Signed   By: Roanna Raider M.D.   On: 04/28/2016 21:18   Dg Elbow Complete Right  Result Date: 04/28/2016 CLINICAL DATA:  Status post fall, with right posterior elbow abrasion and pain. Initial encounter. EXAM: RIGHT ELBOW - COMPLETE 3+ VIEW COMPARISON:  None. FINDINGS: There is no evidence of fracture or dislocation. The visualized joint spaces are preserved. No significant joint effusion is identified. The soft tissues are unremarkable in appearance Dorsal soft tissue air and swelling is noted, reflecting underlying laceration. No radiopaque foreign bodies are seen. IMPRESSION: 1. No evidence of fracture or  dislocation. 2. Dorsal soft tissue air and swelling noted, reflecting underlying laceration. Electronically Signed   By: Roanna Raider M.D.   On: 04/28/2016 21:11   Ct Head Wo Contrast  Result Date: 04/28/2016 CLINICAL DATA:  Fall, confusion EXAM: CT HEAD WITHOUT CONTRAST CT CERVICAL SPINE WITHOUT CONTRAST TECHNIQUE: Multidetector CT imaging of the head and cervical spine was performed following the standard protocol without intravenous contrast. Multiplanar CT image reconstructions of the cervical spine were also generated. COMPARISON:  None. FINDINGS: CT HEAD FINDINGS Brain: No evidence of acute infarction, hemorrhage, extra-axial collection or mass lesion/mass effect. Global cortical and central atrophy. Secondary mild ventriculomegaly. Extensive subcortical white matter and periventricular small vessel ischemic changes. Vascular: Mild intracranial atherosclerosis. Skull: Normal. Negative for fracture or focal lesion. Sinuses/Orbits: The visualized paranasal sinuses are essentially clear. The mastoid air cells are unopacified. Other: None. CT CERVICAL SPINE FINDINGS Alignment: Normal cervical lordosis. Skull base and vertebrae: No acute fracture. No primary bone lesion or focal pathologic process. Soft tissues and spinal canal: No prevertebral fluid or swelling. No visible canal hematoma. Disc levels: Mild degenerative changes, most prominent at C4-5, C5-6, and C6-7. Spinal canal is patent. Upper chest: Visualized lung apices are clear. Other: Visualized thyroid is unremarkable. IMPRESSION: No evidence of acute intracranial abnormality. Atrophy with secondary ventriculomegaly. Extensive small vessel ischemic changes. No evidence of traumatic  injury to the cervical spine. Mild multilevel degenerative changes. Electronically Signed   By: Charline Bills M.D.   On: 04/28/2016 20:56   Ct Cervical Spine Wo Contrast  Result Date: 04/28/2016 CLINICAL DATA:  Fall, confusion EXAM: CT HEAD WITHOUT CONTRAST CT  CERVICAL SPINE WITHOUT CONTRAST TECHNIQUE: Multidetector CT imaging of the head and cervical spine was performed following the standard protocol without intravenous contrast. Multiplanar CT image reconstructions of the cervical spine were also generated. COMPARISON:  None. FINDINGS: CT HEAD FINDINGS Brain: No evidence of acute infarction, hemorrhage, extra-axial collection or mass lesion/mass effect. Global cortical and central atrophy. Secondary mild ventriculomegaly. Extensive subcortical white matter and periventricular small vessel ischemic changes. Vascular: Mild intracranial atherosclerosis. Skull: Normal. Negative for fracture or focal lesion. Sinuses/Orbits: The visualized paranasal sinuses are essentially clear. The mastoid air cells are unopacified. Other: None. CT CERVICAL SPINE FINDINGS Alignment: Normal cervical lordosis. Skull base and vertebrae: No acute fracture. No primary bone lesion or focal pathologic process. Soft tissues and spinal canal: No prevertebral fluid or swelling. No visible canal hematoma. Disc levels: Mild degenerative changes, most prominent at C4-5, C5-6, and C6-7. Spinal canal is patent. Upper chest: Visualized lung apices are clear. Other: Visualized thyroid is unremarkable. IMPRESSION: No evidence of acute intracranial abnormality. Atrophy with secondary ventriculomegaly. Extensive small vessel ischemic changes. No evidence of traumatic injury to the cervical spine. Mild multilevel degenerative changes. Electronically Signed   By: Charline Bills M.D.   On: 04/28/2016 20:56    Time Spent in minutes  30   Caragh Gasper K M.D on 04/29/2016 at 1:49 PM  Between 7am to 7pm - Pager - [phone] ( page via [facility], text pages only, please mention full 10 digit call back number).  After 7pm go to [email or web address] - password [facility]  [facility] -  Office  [phone]

## 2016-04-30 MED ORDER — LORAZEPAM 2 MG/ML IJ SOLN
1.0000 mg | Freq: Once | INTRAMUSCULAR | Status: AC
  Administered 2016-05-01: 1 mg via INTRAVENOUS
  Filled 2016-04-30: qty 1

## 2016-04-30 NOTE — Progress Notes (Signed)
OT Cancellation Note  Patient Details Name: Benjamin Banks MRN: [id number] DOB: 1925/02/19   Cancelled Treatment:    Reason Eval/Treat Not Completed: Other (comment) (per RN, pt confused and combative at this time). RN requesting hold on therapy at this time due to pts agitation, will follow up for OT eval as time allows.  Gaye AlkenBailey A Angeni Chaudhuri M.S., OTR/L Pager: [phone]  04/30/2016, 9:33 AM

## 2016-04-30 NOTE — Progress Notes (Signed)
PROGRESS NOTE                                                                                                                                                                                                             Patient Demographics:    Benjamin Banks, is a 83 y.o. male, DOB - 20-Jun-1925, ZOX:[id number]  Admit date - 04/28/2016   Admitting Physician Lorretta Harp, MD  Outpatient Primary MD for the patient is Ginette Otto, MD  LOS - 2  Chief Complaint  Patient presents with  . Fall       Brief Narrative Benjamin Banks is a 83 y.o. male with medical history significant of dementia, peripheral neuropathy, hypothyroidism, Gilbert's syndrome, BPH, anemia, vertigo, hypertension, hyperlipidemia, GERD, depression, PMR, who presents with failure to thrive, fall, right elbow wound infection   Subjective:    Benjamin Banks today has, No headache, No chest pain, No abdominal pain - No Nausea, No new weakness tingling or numbness, No Cough - SOB.     Assessment  & Plan :     1. Mechanical fall due to generalized weakness and deconditioning most likely due to underlying severe hypothyroidism causing a right elbow laceration and cellulitis. He is not septic, currently on IV vancomycin, continue wound care, x-ray rules out any fracture, CT scan of head and C-spine unremarkable. We'll continue empiric IV vancomycin and follow cultures, If stable will switch to oral doxycycline in the next few days. Head laceration stable.  2. Severe hypothyroidism. TSH greater than 65, change Synthroid to IV form and double the dose for a few days and monitor.Per family friend's patient is noncompliant with his home dose Synthroid and possibly takes it once or twice a week. Likely will discharge on his home dose Synthroid with more emphasis on compliance.  3. Dementia. Remains at risk for delirium. Avoid benzodiazepines and narcotics.  Supportive care.  4. Dyslipidemia. On statin continue.  5. Hypertension currently stable on Norvasc.  6. Depression. On Zoloft continue.  7. Generalized weakness, failure to thrive, moderate protein calorie malnutrition. Possibly all due to severe hypothyroidism. Treatment as an #2 above along with supportive care and protein supplementation. Seen by PT qualifies for SNF.    Diet : Diet Heart Room service appropriate? Yes; Fluid consistency: Thin    Family Communication  : Nice bedside  Code Status :  DNR  Disposition Plan  :  SNF in a.m.  Consults  :  None  Procedures  :  CT head and C-spine. Nonacute.  DVT Prophylaxis  :   Heparin   Lab Results  Component Value Date   PLT 229 04/29/2016    Inpatient Medications  Scheduled Meds: . amLODipine  2.5 mg Oral Daily  . aspirin EC  81 mg Oral Daily  . atorvastatin  10 mg Oral Daily  . collagenase   Topical Daily  . feeding supplement (ENSURE ENLIVE)  237 mL Oral BID BM  . heparin subcutaneous  5,000 Units Subcutaneous Q8H  . levothyroxine  50 mcg Intravenous Daily  . sertraline  25 mg Oral Daily  . sodium chloride flush  3 mL Intravenous Q12H  . vancomycin  1,250 mg Intravenous Q24H   Continuous Infusions: . sodium chloride     PRN Meds:.haloperidol lactate, hydrALAZINE, [DISCONTINUED] ondansetron **OR** ondansetron (ZOFRAN) IV  Antibiotics  :    Anti-infectives    Start     Dose/Rate Route Frequency Ordered Stop   04/29/16 2200  vancomycin (VANCOCIN) IVPB 750 mg/150 ml premix  Status:  Discontinued     750 mg 150 mL/hr over 60 Minutes Intravenous Every 24 hours 04/28/16 2228 04/29/16 1054   04/29/16 2200  vancomycin (VANCOCIN) 1,250 mg in sodium chloride 0.9 % 250 mL IVPB     1,250 mg 166.7 mL/hr over 90 Minutes Intravenous Every 24 hours 04/29/16 1054     04/28/16 2230  vancomycin (VANCOCIN) IVPB 1000 mg/200 mL premix     1,000 mg 200 mL/hr over 60 Minutes Intravenous  Once 04/28/16 2228 04/29/16 0114          Objective:   Vitals:   04/29/16 0943 04/29/16 1259 04/29/16 2039 04/30/16 0258  BP: 138/86 (!) 148/65 (!) 178/79 (!) 158/77  Pulse:  (!) 55 61 99  Resp:  18 18 20   Temp:  97.7 F (36.5 C) 98.4 F (36.9 C)   TempSrc:  Oral Oral Oral  SpO2:  99% 100% 98%  Weight:      Height:        Wt Readings from Last 3 Encounters:  04/29/16 68.2 kg (150 lb 4.8 oz)  01/11/14 72.4 kg (159 lb 9.6 oz)     Intake/Output Summary (Last 24 hours) at 04/30/16 1112 Last data filed at 04/30/16 1108  Gross per 24 hour  Intake              730 ml  Output             1275 ml  Net             -545 ml     Physical Exam  Awake , Pleasantly confused, small occipital head laceration, No new F.N deficits, Normal affect [place].AT,PERRAL Supple Neck,No JVD, No cervical lymphadenopathy appriciated.  Symmetrical Chest wall movement, Good air movement bilaterally, CTAB RRR,No Gallops,Rubs or new Murmurs, No Parasternal Heave +ve B.Sounds, Abd Soft, No tenderness, No organomegaly appriciated, No rebound - guarding or rigidity. No Cyanosis, Clubbing or edema, No new Rash or bruise , R elbow under bandage    Data Review:    CBC  Recent Labs Lab 04/28/16 1943 04/29/16 0656  WBC 6.6 6.1  HGB 11.6* 11.0*  HCT 34.4* 32.7*  PLT 236 229  MCV 92.7 93.2  MCH 31.3 31.3  MCHC 33.7 33.6  RDW 13.9 13.9  LYMPHSABS 1.0  --   MONOABS  0.7  --   EOSABS 0.1  --   BASOSABS 0.0  --     Chemistries   Recent Labs Lab 04/28/16 1943 04/29/16 0656  NA 131* 133*  K 4.5 3.6  CL 94* 98*  CO2 26 24  GLUCOSE 94 74  BUN 14 11  CREATININE 1.20 1.05  CALCIUM 9.2 8.6*   ------------------------------------------------------------------------------------------------------------------ No results for input(s): CHOL, HDL, LDLCALC, TRIG, CHOLHDL, LDLDIRECT in the last 72 hours.  No results found for:  HGBA1C ------------------------------------------------------------------------------------------------------------------  Recent Labs  04/29/16 0656  TSH 64.685*   ------------------------------------------------------------------------------------------------------------------ No results for input(s): VITAMINB12, FOLATE, FERRITIN, TIBC, IRON, RETICCTPCT in the last 72 hours.  Coagulation profile No results for input(s): INR, PROTIME in the last 168 hours.  No results for input(s): DDIMER in the last 72 hours.  Cardiac Enzymes No results for input(s): CKMB, TROPONINI, MYOGLOBIN in the last 168 hours.  Invalid input(s): CK ------------------------------------------------------------------------------------------------------------------ No results found for: BNP  Micro Results Recent Results (from the past 240 hour(s))  Blood culture (routine x 2)     Status: None (Preliminary result)   Collection Time: 04/28/16 10:20 PM  Result Value Ref Range Status   Specimen Description BLOOD LEFT ANTECUBITAL  Final   Special Requests   Final    BOTTLES DRAWN AEROBIC ONLY 5CC IN PEDIATRIC BOTTLE 2CC   Culture NO GROWTH 2 DAYS  Final   Report Status PENDING  Incomplete  Blood culture (routine x 2)     Status: None (Preliminary result)   Collection Time: 04/28/16 11:10 PM  Result Value Ref Range Status   Specimen Description BLOOD RIGHT ARM  Final   Special Requests BOTTLES DRAWN AEROBIC ONLY 5CC PT ON VANC  Final   Culture NO GROWTH 1 DAY  Final   Report Status PENDING  Incomplete    Radiology Reports Dg Chest 2 View  Result Date: 04/28/2016 CLINICAL DATA:  Status post fall, with confusion and concern for chest injury. Initial encounter. EXAM: CHEST  2 VIEW COMPARISON:  None. FINDINGS: The lungs are well-aerated and clear. There is no evidence of focal opacification, pleural effusion or pneumothorax. The heart is borderline normal in size. No acute osseous abnormalities are seen.  Scattered calcification is seen along the lower thoracic and abdominal aorta. IMPRESSION: 1. No acute cardiopulmonary process seen. No displaced rib fractures identified. 2. Scattered aortic atherosclerosis. Electronically Signed   By: Roanna RaiderJeffery  Chang M.D.   On: 04/28/2016 21:18   Dg Elbow Complete Right  Result Date: 04/28/2016 CLINICAL DATA:  Status post fall, with right posterior elbow abrasion and pain. Initial encounter. EXAM: RIGHT ELBOW - COMPLETE 3+ VIEW COMPARISON:  None. FINDINGS: There is no evidence of fracture or dislocation. The visualized joint spaces are preserved. No significant joint effusion is identified. The soft tissues are unremarkable in appearance Dorsal soft tissue air and swelling is noted, reflecting underlying laceration. No radiopaque foreign bodies are seen. IMPRESSION: 1. No evidence of fracture or dislocation. 2. Dorsal soft tissue air and swelling noted, reflecting underlying laceration. Electronically Signed   By: Roanna RaiderJeffery  Chang M.D.   On: 04/28/2016 21:11   Ct Head Wo Contrast  Result Date: 04/28/2016 CLINICAL DATA:  Fall, confusion EXAM: CT HEAD WITHOUT CONTRAST CT CERVICAL SPINE WITHOUT CONTRAST TECHNIQUE: Multidetector CT imaging of the head and cervical spine was performed following the standard protocol without intravenous contrast. Multiplanar CT image reconstructions of the cervical spine were also generated. COMPARISON:  None. FINDINGS: CT HEAD FINDINGS Brain: No evidence of acute  infarction, hemorrhage, extra-axial collection or mass lesion/mass effect. Global cortical and central atrophy. Secondary mild ventriculomegaly. Extensive subcortical white matter and periventricular small vessel ischemic changes. Vascular: Mild intracranial atherosclerosis. Skull: Normal. Negative for fracture or focal lesion. Sinuses/Orbits: The visualized paranasal sinuses are essentially clear. The mastoid air cells are unopacified. Other: None. CT CERVICAL SPINE FINDINGS Alignment:  Normal cervical lordosis. Skull base and vertebrae: No acute fracture. No primary bone lesion or focal pathologic process. Soft tissues and spinal canal: No prevertebral fluid or swelling. No visible canal hematoma. Disc levels: Mild degenerative changes, most prominent at C4-5, C5-6, and C6-7. Spinal canal is patent. Upper chest: Visualized lung apices are clear. Other: Visualized thyroid is unremarkable. IMPRESSION: No evidence of acute intracranial abnormality. Atrophy with secondary ventriculomegaly. Extensive small vessel ischemic changes. No evidence of traumatic injury to the cervical spine. Mild multilevel degenerative changes. Electronically Signed   By: Charline Bills M.D.   On: 04/28/2016 20:56   Ct Cervical Spine Wo Contrast  Result Date: 04/28/2016 CLINICAL DATA:  Fall, confusion EXAM: CT HEAD WITHOUT CONTRAST CT CERVICAL SPINE WITHOUT CONTRAST TECHNIQUE: Multidetector CT imaging of the head and cervical spine was performed following the standard protocol without intravenous contrast. Multiplanar CT image reconstructions of the cervical spine were also generated. COMPARISON:  None. FINDINGS: CT HEAD FINDINGS Brain: No evidence of acute infarction, hemorrhage, extra-axial collection or mass lesion/mass effect. Global cortical and central atrophy. Secondary mild ventriculomegaly. Extensive subcortical white matter and periventricular small vessel ischemic changes. Vascular: Mild intracranial atherosclerosis. Skull: Normal. Negative for fracture or focal lesion. Sinuses/Orbits: The visualized paranasal sinuses are essentially clear. The mastoid air cells are unopacified. Other: None. CT CERVICAL SPINE FINDINGS Alignment: Normal cervical lordosis. Skull base and vertebrae: No acute fracture. No primary bone lesion or focal pathologic process. Soft tissues and spinal canal: No prevertebral fluid or swelling. No visible canal hematoma. Disc levels: Mild degenerative changes, most prominent at C4-5,  C5-6, and C6-7. Spinal canal is patent. Upper chest: Visualized lung apices are clear. Other: Visualized thyroid is unremarkable. IMPRESSION: No evidence of acute intracranial abnormality. Atrophy with secondary ventriculomegaly. Extensive small vessel ischemic changes. No evidence of traumatic injury to the cervical spine. Mild multilevel degenerative changes. Electronically Signed   By: Charline Bills M.D.   On: 04/28/2016 20:56    Time Spent in minutes  30   SINGH,PRASHANT K M.D on 04/30/2016 at 11:12 AM  Between 7am to 7pm - Pager - [phone] ( page via [facility], text pages only, please mention full 10 digit call back number).  After 7pm go to [email or web address] - password [facility]  [facility] -  Office  [phone]

## 2016-05-01 LAB — BASIC METABOLIC PANEL
ANION GAP: 9 (ref 5–15)
BUN: 8 mg/dL (ref 6–20)
CALCIUM: 8.3 mg/dL — AB (ref 8.9–10.3)
CO2: 25 mmol/L (ref 22–32)
Chloride: 96 mmol/L — ABNORMAL LOW (ref 101–111)
Creatinine, Ser: 0.92 mg/dL (ref 0.61–1.24)
GFR calc Af Amer: >60 mL/min (ref >60)
GFR calc non Af Amer: >60 mL/min (ref >60)
GLUCOSE: 83 mg/dL (ref 65–99)
POTASSIUM: 3.6 mmol/L (ref 3.5–5.1)
Sodium: 130 mmol/L — ABNORMAL LOW (ref 135–145)

## 2016-05-01 MED ORDER — DOCUSATE SODIUM 100 MG PO CAPS
200.0000 mg | ORAL_CAPSULE | Freq: Two times a day (BID) | ORAL | Status: DC
  Administered 2016-05-01 – 2016-05-03 (×3): 200 mg via ORAL
  Filled 2016-05-01 (×5): qty 2

## 2016-05-01 MED ORDER — BISACODYL 10 MG RE SUPP
10.0000 mg | Freq: Every day | RECTAL | Status: DC
Start: 2016-05-01 — End: 2016-05-03
  Administered 2016-05-01: 10 mg via RECTAL
  Filled 2016-05-01: qty 1

## 2016-05-01 MED ORDER — DOXYCYCLINE HYCLATE 100 MG PO TABS
100.0000 mg | ORAL_TABLET | Freq: Two times a day (BID) | ORAL | Status: DC
  Administered 2016-05-01 – 2016-05-03 (×5): 100 mg via ORAL
  Filled 2016-05-01 (×5): qty 1

## 2016-05-01 MED ORDER — SODIUM CHLORIDE 0.9 % IV SOLN
INTRAVENOUS | Status: DC
  Administered 2016-05-01: 11:00:00 via INTRAVENOUS

## 2016-05-01 MED ORDER — BISACODYL 5 MG PO TBEC
10.0000 mg | DELAYED_RELEASE_TABLET | Freq: Once | ORAL | Status: DC
  Filled 2016-05-01: qty 2

## 2016-05-01 NOTE — Evaluation (Signed)
Occupational Therapy Evaluation Patient Details Name: Benjamin Banks Kemple MRN: [id number] DOB: [birth year] Today's Date: 05/01/2016    History of Present Illness 83 yo admitted from home after fall with neighbors concerned for his safety as he lives alone with increasing confusion. PMHx:dementia, neuropathy, vertigo, HTn, HLD   Clinical Impression   Per chart review, pt independent with ADL PTA. Currently pt overall min assist for ADL and functional mobility with cues throughout for sequencing and safety. Recommending SNF for follow up to maximize independence and safety with ADL and functional mobility prior to return home. Pt would benefit from continued skilled OT to address established goals.    Follow Up Recommendations  SNF;Supervision/Assistance - 24 hour    Equipment Recommendations  Other (comment) (TBD at next venue)    Recommendations for Other Services       Precautions / Restrictions Precautions Precautions: Fall Restrictions Weight Bearing Restrictions: No      Mobility Bed Mobility               General bed mobility comments: Pt OOB upon arrival  Transfers Overall transfer level: Needs assistance Equipment used: 1 person hand held assist Transfers: Sit to/from Stand Sit to Stand: Min assist         General transfer comment: Min assist to boost up from toilet with cues for use of grab bar.     Balance Overall balance assessment: Needs assistance Sitting-balance support: Feet supported;No upper extremity supported Sitting balance-Leahy Scale: Fair     Standing balance support: No upper extremity supported;During functional activity Standing balance-Leahy Scale: Fair Standing balance comment: Able to stand at sink and wash hands without UE support                            ADL Overall ADL's : Needs assistance/impaired Eating/Feeding: Set up;Sitting Eating/Feeding Details (indicate cue type and reason): set up with dinner at end of  session Grooming: Minimal assistance;Standing;Wash/dry hands   Upper Body Bathing: Min guard;Sitting   Lower Body Bathing: Minimal assistance;Sit to/from stand   Upper Body Dressing : Min guard;Sitting   Lower Body Dressing: Minimal assistance;Sit to/from stand   Toilet Transfer: Minimal assistance;Ambulation;Regular Toilet;Grab bars;Cueing for safety;Cueing for sequencing   Toileting- Clothing Manipulation and Hygiene: Supervision/safety;Sitting/lateral lean Toileting - Clothing Manipulation Details (indicate cue type and reason): for peri care only     Functional mobility during ADLs: Minimal assistance (hand held assist)       Vision         Perception     Praxis      Pertinent Vitals/Pain Pain Assessment: No/denies pain     Hand Dominance Right   Extremity/Trunk Assessment Upper Extremity Assessment Upper Extremity Assessment: Generalized weakness   Lower Extremity Assessment Lower Extremity Assessment: Defer to PT evaluation   Cervical / Trunk Assessment Cervical / Trunk Assessment: Kyphotic   Communication Communication Communication: No difficulties   Cognition Arousal/Alertness: Awake/alert Behavior During Therapy: Impulsive Overall Cognitive Status: No family/caregiver present to determine baseline cognitive functioning                     General Comments       Exercises       Shoulder Instructions      Home Living Family/patient expects to be discharged to:: Private residence Living Arrangements: Alone Available Help at Discharge: Friend(s);Available PRN/intermittently Type of Home: House Home Access: Stairs to enter Entergy CorporationEntrance Stairs-Number of Steps: 3  Home Layout: One level     Bathroom Shower/Tub: Producer, television/film/video: Standard     Home Equipment: Environmental consultant - 2 wheels   Additional Comments: Information taken from PT eval; pt is a poor historian and no family present      Prior Functioning/Environment  Level of Independence: Independent                 OT Problem List: Decreased strength;Decreased activity tolerance;Impaired balance (sitting and/or standing);Decreased safety awareness;Decreased knowledge of use of DME or AE;Decreased knowledge of precautions      OT Treatment/Interventions: Self-care/ADL training;Therapeutic exercise;Energy conservation;DME and/or AE instruction;Therapeutic activities;Patient/family education;Balance training;Cognitive remediation/compensation    OT Goals(Current goals can be found in the care plan section) Acute Rehab OT Goals Patient Stated Goal: eat some dinner OT Goal Formulation: With patient Time For Goal Achievement: 05/15/16 Potential to Achieve Goals: Good ADL Goals Pt Will Perform Grooming: with supervision;standing Pt Will Perform Upper Body Bathing: with set-up;sitting Pt Will Perform Lower Body Bathing: with supervision;sit to/from stand Pt Will Transfer to Toilet: with supervision;ambulating;regular height toilet Pt Will Perform Toileting - Clothing Manipulation and hygiene: with supervision;sit to/from stand  OT Frequency: Min 2X/week   Barriers to D/C: Decreased caregiver support  lives alone       Co-evaluation              End of Session Nurse Communication: Mobility status  Activity Tolerance: Patient tolerated treatment well Patient left: in chair;with call bell/phone within reach;with chair alarm set  OT Visit Diagnosis: Unsteadiness on feet (R26.81);Other abnormalities of gait and mobility (R26.89);History of falling (Z91.81)                ADL either performed or assessed with clinical judgement  Time: 4098-1191 OT Time Calculation (min): 14 min Charges:  OT General Charges $OT Visit: 1 Procedure OT Evaluation $OT Eval Moderate Complexity: 1 Procedure G-Codes:     Tallin Hart A. Brett Albino, M.S., OTR/L Pager: [phone]  Gaye Alken 05/01/2016, 5:02 PM

## 2016-05-01 NOTE — Consult Note (Signed)
            [facility]  05/01/2016  Benjamin Banks [birth year] [id number]  Went to see patient in the room to identify possible discharge needs but patient was fast asleep. Safety sitter was at the bedside and reports that patient had a "rough night", was agitated (trying to get out of bed).  Patient's chart review reveals that one of his POA Melvyn Neth(Thomas Garrett) stated that patient lives at home by himself and family will be unable to provide 24 hr supervision for patient. POA was agreeable to recommendation for SNF (skilled nursing facility) placement and would prefer [facility] ([place]) as he has his mother in-law in that facility.  Inpatient social worker is assisting in skilled nursing facility search for patient.  For additional questions please contact:  Karin GoldenLorraine A. Larkin Alfred, BSN, RN-BC [facility] Navigator Cell: [phone]

## 2016-05-01 NOTE — Plan of Care (Signed)
Problem: Safety: Goal: Ability to remain free from injury will improve Outcome: Progressing Patient remains free from fall on this shift  Problem: Pain Managment: Goal: General experience of comfort will improve Outcome: Progressing Patient resting quietly in bed, no discomfort observed or voiced.

## 2016-05-01 NOTE — Progress Notes (Signed)
PROGRESS NOTE                                                                                                                                                                                                             Patient Demographics:    Benjamin Banks, is a 83 y.o. male, DOB - 07-Mar-1925, ZOX:[id number]  Admit date - 04/28/2016   Admitting Physician Lorretta Harp, MD  Outpatient Primary MD for the patient is Ginette Otto, MD  LOS - 3  Chief Complaint  Patient presents with  . Fall       Brief Narrative Benjamin Banks is a 83 y.o. male with medical history significant of dementia, peripheral neuropathy, hypothyroidism, Gilbert's syndrome, BPH, anemia, vertigo, hypertension, hyperlipidemia, GERD, depression, PMR, who presents with failure to thrive, fall, right elbow wound infection   Subjective:    Benjamin Banks today Is sleepy in bed, unreliable historian but denies any headache chest or abdominal pain.     Assessment  & Plan :     1. Mechanical fall due to generalized weakness and deconditioning most likely due to underlying severe hypothyroidism causing a right elbow/arm laceration and mild cellulitis. He is not septic, continue wound care and switched to oral doxycycline on 05/01/2016, x-ray ruled out any fracture, CT scan of head and C-spine unremarkable. Head laceration stable.  2. Severe hypothyroidism. TSH greater than 65, change Synthroid to IV form and double the dose for a few days and monitor.Per family friend's patient is noncompliant with his home dose Synthroid and possibly takes it once or twice a week. Likely will discharge on his home dose Synthroid with more emphasis on compliance.  3. Dementia. Remains at risk for delirium. Avoid benzodiazepines and narcotics. Supportive care and Haldol when necessary.  4. Dyslipidemia. On statin continue.  5. Hypertension currently stable on  Norvasc.  6. Depression. On Zoloft continue.  7. Generalized weakness, failure to thrive, moderate protein calorie malnutrition. Possibly all due to severe hypothyroidism. Treatment as an #2 above along with supportive care and protein supplementation. Seen by PT qualifies for SNF.  8. Hyponatremia. Likely due to dehydration. Poor oral intake, gentle hydration with IV fluids. Repeat sodium in the morning.    Diet : Diet Heart Room service appropriate? Yes; Fluid consistency: Thin    Family Communication  : Nice bedside  Code Status :  DNR  Disposition Plan  :  SNF on 05-02-16 if sodium is better  Consults  :  None  Procedures  :  CT head and C-spine. Nonacute.  DVT Prophylaxis  :   Heparin   Lab Results  Component Value Date   PLT 229 04/29/2016    Inpatient Medications  Scheduled Meds: . amLODipine  2.5 mg Oral Daily  . aspirin EC  81 mg Oral Daily  . atorvastatin  10 mg Oral Daily  . collagenase   Topical Daily  . doxycycline  100 mg Oral Q12H  . feeding supplement (ENSURE ENLIVE)  237 mL Oral BID BM  . heparin subcutaneous  5,000 Units Subcutaneous Q8H  . levothyroxine  50 mcg Intravenous Daily  . sertraline  25 mg Oral Daily  . sodium chloride flush  3 mL Intravenous Q12H   Continuous Infusions: . sodium chloride     PRN Meds:.haloperidol lactate, hydrALAZINE, [DISCONTINUED] ondansetron **OR** ondansetron (ZOFRAN) IV  Antibiotics  :    Anti-infectives    Start     Dose/Rate Route Frequency Ordered Stop   05/01/16 1000  doxycycline (VIBRA-TABS) tablet 100 mg     100 mg Oral Every 12 hours 05/01/16 0921     04/29/16 2200  vancomycin (VANCOCIN) IVPB 750 mg/150 ml premix  Status:  Discontinued     750 mg 150 mL/hr over 60 Minutes Intravenous Every 24 hours 04/28/16 2228 04/29/16 1054   04/29/16 2200  vancomycin (VANCOCIN) 1,250 mg in sodium chloride 0.9 % 250 mL IVPB  Status:  Discontinued     1,250 mg 166.7 mL/hr over 90 Minutes Intravenous Every 24 hours  04/29/16 1054 05/01/16 0921   04/28/16 2230  vancomycin (VANCOCIN) IVPB 1000 mg/200 mL premix     1,000 mg 200 mL/hr over 60 Minutes Intravenous  Once 04/28/16 2228 04/29/16 0114         Objective:   Vitals:   04/30/16 0258 04/30/16 1335 04/30/16 2121 05/01/16 0500  BP: (!) 158/77 137/81 (!) 168/83 (!) 151/81  Pulse: 99 (!) 57 67 61  Resp: 20 14 18 18   Temp:  98 F (36.7 C) 97.3 F (36.3 C) 97.3 F (36.3 C)  TempSrc: Oral Oral Oral Axillary  SpO2: 98% 96% 100% 98%  Weight:      Height:        Wt Readings from Last 3 Encounters:  04/29/16 68.2 kg (150 lb 4.8 oz)  01/11/14 72.4 kg (159 lb 9.6 oz)     Intake/Output Summary (Last 24 hours) at 05/01/16 1610 Last data filed at 05/01/16 0830  Gross per 24 hour  Intake              490 ml  Output              850 ml  Net             -360 ml     Physical Exam  Awake , Pleasantly confused, small occipital head laceration, No new F.N deficits, Normal affect [place].AT,PERRAL Supple Neck,No JVD, No cervical lymphadenopathy appriciated.  Symmetrical Chest wall movement, Good air movement bilaterally, CTAB RRR,No Gallops,Rubs or new Murmurs, No Parasternal Heave +ve B.Sounds, Abd Soft, No tenderness, No organomegaly appriciated, No rebound - guarding or rigidity. No Cyanosis, Clubbing or edema, No new Rash or bruise , R elbow under bandage    Data Review:    CBC  Recent Labs Lab 04/28/16 1943 04/29/16 0656  WBC 6.6 6.1  HGB 11.6* 11.0*  HCT 34.4* 32.7*  PLT 236 229  MCV 92.7 93.2  MCH 31.3 31.3  MCHC 33.7 33.6  RDW 13.9 13.9  LYMPHSABS 1.0  --   MONOABS 0.7  --   EOSABS 0.1  --   BASOSABS 0.0  --     Chemistries   Recent Labs Lab 04/28/16 1943 04/29/16 0656 05/01/16 0234  NA 131* 133* 130*  K 4.5 3.6 3.6  CL 94* 98* 96*  CO2 26 24 25   GLUCOSE 94 74 83  BUN 14 11 8   CREATININE 1.20 1.05 0.92  CALCIUM 9.2 8.6* 8.3*    ------------------------------------------------------------------------------------------------------------------ No results for input(s): CHOL, HDL, LDLCALC, TRIG, CHOLHDL, LDLDIRECT in the last 72 hours.  No results found for: HGBA1C ------------------------------------------------------------------------------------------------------------------  Recent Labs  04/29/16 0656  TSH 64.685*   ------------------------------------------------------------------------------------------------------------------ No results for input(s): VITAMINB12, FOLATE, FERRITIN, TIBC, IRON, RETICCTPCT in the last 72 hours.  Coagulation profile No results for input(s): INR, PROTIME in the last 168 hours.  No results for input(s): DDIMER in the last 72 hours.  Cardiac Enzymes No results for input(s): CKMB, TROPONINI, MYOGLOBIN in the last 168 hours.  Invalid input(s): CK ------------------------------------------------------------------------------------------------------------------ No results found for: BNP  Micro Results Recent Results (from the past 240 hour(s))  Blood culture (routine x 2)     Status: None (Preliminary result)   Collection Time: 04/28/16 10:20 PM  Result Value Ref Range Status   Specimen Description BLOOD LEFT ANTECUBITAL  Final   Special Requests   Final    BOTTLES DRAWN AEROBIC ONLY 5CC IN PEDIATRIC BOTTLE 2CC   Culture NO GROWTH 2 DAYS  Final   Report Status PENDING  Incomplete  Blood culture (routine x 2)     Status: None (Preliminary result)   Collection Time: 04/28/16 11:10 PM  Result Value Ref Range Status   Specimen Description BLOOD RIGHT ARM  Final   Special Requests BOTTLES DRAWN AEROBIC ONLY 5CC PT ON VANC  Final   Culture NO GROWTH 1 DAY  Final   Report Status PENDING  Incomplete    Radiology Reports Dg Chest 2 View  Result Date: 04/28/2016 CLINICAL DATA:  Status post fall, with confusion and concern for chest injury. Initial encounter. EXAM: CHEST  2  VIEW COMPARISON:  None. FINDINGS: The lungs are well-aerated and clear. There is no evidence of focal opacification, pleural effusion or pneumothorax. The heart is borderline normal in size. No acute osseous abnormalities are seen. Scattered calcification is seen along the lower thoracic and abdominal aorta. IMPRESSION: 1. No acute cardiopulmonary process seen. No displaced rib fractures identified. 2. Scattered aortic atherosclerosis. Electronically Signed   By: Roanna RaiderJeffery  Chang M.D.   On: 04/28/2016 21:18   Dg Elbow Complete Right  Result Date: 04/28/2016 CLINICAL DATA:  Status post fall, with right posterior elbow abrasion and pain. Initial encounter. EXAM: RIGHT ELBOW - COMPLETE 3+ VIEW COMPARISON:  None. FINDINGS: There is no evidence of fracture or dislocation. The visualized joint spaces are preserved. No significant joint effusion is identified. The soft tissues are unremarkable in appearance Dorsal soft tissue air and swelling is noted, reflecting underlying laceration. No radiopaque foreign bodies are seen. IMPRESSION: 1. No evidence of fracture or dislocation. 2. Dorsal soft tissue air and swelling noted, reflecting underlying laceration. Electronically Signed   By: Roanna RaiderJeffery  Chang M.D.   On: 04/28/2016 21:11   Ct Head Wo Contrast  Result Date: 04/28/2016 CLINICAL DATA:  Fall, confusion EXAM: CT HEAD WITHOUT CONTRAST CT CERVICAL  SPINE WITHOUT CONTRAST TECHNIQUE: Multidetector CT imaging of the head and cervical spine was performed following the standard protocol without intravenous contrast. Multiplanar CT image reconstructions of the cervical spine were also generated. COMPARISON:  None. FINDINGS: CT HEAD FINDINGS Brain: No evidence of acute infarction, hemorrhage, extra-axial collection or mass lesion/mass effect. Global cortical and central atrophy. Secondary mild ventriculomegaly. Extensive subcortical white matter and periventricular small vessel ischemic changes. Vascular: Mild intracranial  atherosclerosis. Skull: Normal. Negative for fracture or focal lesion. Sinuses/Orbits: The visualized paranasal sinuses are essentially clear. The mastoid air cells are unopacified. Other: None. CT CERVICAL SPINE FINDINGS Alignment: Normal cervical lordosis. Skull base and vertebrae: No acute fracture. No primary bone lesion or focal pathologic process. Soft tissues and spinal canal: No prevertebral fluid or swelling. No visible canal hematoma. Disc levels: Mild degenerative changes, most prominent at C4-5, C5-6, and C6-7. Spinal canal is patent. Upper chest: Visualized lung apices are clear. Other: Visualized thyroid is unremarkable. IMPRESSION: No evidence of acute intracranial abnormality. Atrophy with secondary ventriculomegaly. Extensive small vessel ischemic changes. No evidence of traumatic injury to the cervical spine. Mild multilevel degenerative changes. Electronically Signed   By: Charline Bills M.D.   On: 04/28/2016 20:56   Ct Cervical Spine Wo Contrast  Result Date: 04/28/2016 CLINICAL DATA:  Fall, confusion EXAM: CT HEAD WITHOUT CONTRAST CT CERVICAL SPINE WITHOUT CONTRAST TECHNIQUE: Multidetector CT imaging of the head and cervical spine was performed following the standard protocol without intravenous contrast. Multiplanar CT image reconstructions of the cervical spine were also generated. COMPARISON:  None. FINDINGS: CT HEAD FINDINGS Brain: No evidence of acute infarction, hemorrhage, extra-axial collection or mass lesion/mass effect. Global cortical and central atrophy. Secondary mild ventriculomegaly. Extensive subcortical white matter and periventricular small vessel ischemic changes. Vascular: Mild intracranial atherosclerosis. Skull: Normal. Negative for fracture or focal lesion. Sinuses/Orbits: The visualized paranasal sinuses are essentially clear. The mastoid air cells are unopacified. Other: None. CT CERVICAL SPINE FINDINGS Alignment: Normal cervical lordosis. Skull base and  vertebrae: No acute fracture. No primary bone lesion or focal pathologic process. Soft tissues and spinal canal: No prevertebral fluid or swelling. No visible canal hematoma. Disc levels: Mild degenerative changes, most prominent at C4-5, C5-6, and C6-7. Spinal canal is patent. Upper chest: Visualized lung apices are clear. Other: Visualized thyroid is unremarkable. IMPRESSION: No evidence of acute intracranial abnormality. Atrophy with secondary ventriculomegaly. Extensive small vessel ischemic changes. No evidence of traumatic injury to the cervical spine. Mild multilevel degenerative changes. Electronically Signed   By: Charline Bills M.D.   On: 04/28/2016 20:56    Time Spent in minutes  30   SINGH,PRASHANT K M.D on 05/01/2016 at 9:22 AM  Between 7am to 7pm - Pager - [phone] ( page via [facility], text pages only, please mention full 10 digit call back number).  After 7pm go to [email or web address] - password [facility]  [facility] -  Office  [phone]

## 2016-05-02 LAB — BASIC METABOLIC PANEL
Anion gap: 9 (ref 5–15)
BUN: 10 mg/dL (ref 6–20)
CHLORIDE: 94 mmol/L — AB (ref 101–111)
CO2: 25 mmol/L (ref 22–32)
CREATININE: 0.9 mg/dL (ref 0.61–1.24)
Calcium: 8.6 mg/dL — ABNORMAL LOW (ref 8.9–10.3)
GFR calc Af Amer: >60 mL/min (ref >60)
GFR calc non Af Amer: >60 mL/min (ref >60)
GLUCOSE: 93 mg/dL (ref 65–99)
POTASSIUM: 3.4 mmol/L — AB (ref 3.5–5.1)
Sodium: 128 mmol/L — ABNORMAL LOW (ref 135–145)

## 2016-05-02 LAB — URIC ACID: Uric Acid, Serum: 3.5 mg/dL — ABNORMAL LOW (ref 4.4–7.6)

## 2016-05-02 LAB — OSMOLALITY, URINE: Osmolality, Ur: 271 mOsm/kg — ABNORMAL LOW (ref 300–900)

## 2016-05-02 LAB — SODIUM, URINE, RANDOM: Sodium, Ur: 92 mmol/L

## 2016-05-02 LAB — OSMOLALITY: Osmolality: 271 mOsm/kg — ABNORMAL LOW (ref 275–295)

## 2016-05-02 MED ORDER — POTASSIUM CHLORIDE 20 MEQ/15ML (10%) PO SOLN
40.0000 meq | Freq: Two times a day (BID) | ORAL | Status: AC
Start: 2016-05-02 — End: 2016-05-02
  Administered 2016-05-02 (×2): 40 meq via ORAL
  Filled 2016-05-02 (×2): qty 30

## 2016-05-02 MED ORDER — FUROSEMIDE 10 MG/ML IJ SOLN
40.0000 mg | Freq: Once | INTRAMUSCULAR | Status: AC
  Administered 2016-05-02: 40 mg via INTRAVENOUS
  Filled 2016-05-02: qty 4

## 2016-05-02 NOTE — Progress Notes (Signed)
qPhysical Therapy Treatment Patient Details Name: Benjamin Banks MRN: [id number] DOB: 01-Nov-1925 Today's Date: 05/02/2016    History of Present Illness 83 yo admitted from home after fall with neighbors concerned for his safety as he lives alone with increasing confusion. PMHx:dementia, neuropathy, vertigo, HTn, HLD    PT Comments    Pt remains confused with decreased awareness, problem solving and orientation who demonstrated improved mobility and tolerance for gait today with 2 person assist. Unable to mobilize further or perform HEP due to cognitive deficits. Will continue to follow to maximize function.    Follow Up Recommendations  SNF;Supervision/Assistance - 24 hour     Equipment Recommendations       Recommendations for Other Services       Precautions / Restrictions Precautions Precautions: Fall    Mobility  Bed Mobility Overal bed mobility: Needs Assistance Bed Mobility: Supine to Sit     Supine to sit: Min assist     General bed mobility comments: cues for sequence with use of rails and increased time. Mod cues EOB to remain sitting for use of urinal as pt impulsive  Transfers Overall transfer level: Needs assistance   Transfers: Sit to/from Stand Sit to Stand: Mod assist         General transfer comment: mod assist for initial stand as pt somewhat resistant and trying to return to soiled bed and required increased assist to stand. [place] with cues for safety and hand placement to sit in chair  Ambulation/Gait Ambulation/Gait assistance: Min assist;+2 safety/equipment;+2 physical assistance Ambulation Distance (Feet): 120 Feet Assistive device: Rolling walker (2 wheeled);2 person hand held assist Gait Pattern/deviations: Step-through pattern;Trunk flexed;Shuffle   Gait velocity interpretation: Below normal speed for age/gender General Gait Details: cues for posture and position in RW with min assist to direct pt and walker with pt maintaining  flexed posture and self too posterior to RW. Removed RW after grossly 4640' with transition to bil HHA with chair follow. Pt with improved gait and posture as well as endurance with bil HHA   Stairs            Wheelchair Mobility    Modified Rankin (Stroke Patients Only)       Balance Overall balance assessment: Needs assistance   Sitting balance-Leahy Scale: Fair       Standing balance-Leahy Scale: Poor                              Cognition Arousal/Alertness: Awake/alert Behavior During Therapy: Impulsive Overall Cognitive Status: Impaired/Different from baseline Area of Impairment: Orientation;Attention;Memory;Following commands;Safety/judgement;Awareness;Problem solving                 Orientation Level: Disoriented to;Place;Time;Situation Current Attention Level: Sustained Memory: Decreased short-term memory Following Commands: Follows one step commands inconsistently;Follows one step commands with increased time Safety/Judgement: Decreased awareness of safety;Decreased awareness of deficits Awareness: Intellectual Problem Solving: Slow processing;Decreased initiation;Difficulty sequencing;Requires verbal cues;Requires tactile cues        Exercises      General Comments        Pertinent Vitals/Pain Pain Assessment: No/denies pain    Home Living                      Prior Function            PT Goals (current goals can now be found in the care plan section) Progress towards PT goals: Progressing toward goals  Frequency           PT Plan Current plan remains appropriate    Co-evaluation             End of Session Equipment Utilized During Treatment: Gait belt Activity Tolerance: Patient tolerated treatment well Patient left: in chair;with call bell/phone within reach;with chair alarm set;with family/visitor present Nurse Communication: Mobility status PT Visit Diagnosis: Unsteadiness on feet  (R26.81);History of falling (Z91.81);Difficulty in walking, not elsewhere classified (R26.2);Muscle weakness (generalized) (M62.81)     Time: 6962-9528 PT Time Calculation (min) (ACUTE ONLY): 16 min  Charges:  $Gait Training: 8-22 mins                    G Codes:          Corinna Burkman B Dashana Guizar 05/02/2016, 10:12 AM  Delaney Meigs, PT [phone]

## 2016-05-02 NOTE — Care Management Important Message (Signed)
Important Message  Patient Details  Name: Benjamin Banks MRN: [id number] Date of Birth: Nov 05, 1925   Medicare Important Message Given:  Yes    [name], [name] Abena 05/02/2016, 11:54 AM

## 2016-05-02 NOTE — Progress Notes (Signed)
Clinical Social Worker spoke to [facility]. Toniann FailWendy stated that they are unable to take patient back because Physical Therapy is recommending SNF. Toniann FailWendy also stated that since patient is still on restraint he would be unable to return. Toniann FailWendy stated she had spoken to patients guardian and made her aware of the situation. CSW remains available for support and discharge needs.  Marrianne MoodAshley Tannar Broker, MSW,  [employer] [phone]

## 2016-05-02 NOTE — Progress Notes (Addendum)
PROGRESS NOTE                                                                                                                                                                                                             Patient Demographics:    Benjamin Banks, is a 83 y.o. male, DOB - October 27, 1925, ZOX:[id number]  Admit date - 04/28/2016   Admitting Physician Lorretta Harp, MD  Outpatient Primary MD for the patient is Ginette Otto, MD  LOS - 4  Chief Complaint  Patient presents with  . Fall       Brief Narrative Benjamin Banks is a 83 y.o. male with medical history significant of dementia, peripheral neuropathy, hypothyroidism, Gilbert's syndrome, BPH, anemia, vertigo, hypertension, hyperlipidemia, GERD, depression, PMR, who presents with failure to thrive, fall, right elbow wound infection   Subjective:    Benjamin Banks today Is sleepy in bed, unreliable historian but again denies any headache chest or abdominal pain.  No focal weakness.   Assessment  & Plan :     1. Mechanical fall due to generalized weakness and deconditioning most likely due to underlying severe hypothyroidism causing a right elbow/arm laceration and mild cellulitis. He is not septic, continue wound care and switched to oral doxycycline on 05/01/2016, x-ray ruled out any fracture, CT scan of head and C-spine unremarkable. Head laceration stable.  2. Severe hypothyroidism. TSH greater than 65, change Synthroid to IV form and double the dose for a few days and monitor.Per family friend's patient is noncompliant with his home dose Synthroid and possibly takes it once or twice a week. Likely will discharge on his home dose Synthroid with more emphasis on compliance.  3. Dementia. Remains at risk for delirium. Avoid benzodiazepines and narcotics. Supportive care and Haldol when necessary.  4. Dyslipidemia. On statin continue.  5. Hypertension  currently stable on Norvasc.  6. Depression. On Zoloft continue.  7. Generalized weakness, failure to thrive, moderate protein calorie malnutrition. Possibly all due to severe hypothyroidism. Treatment as an #2 above along with supportive care and protein supplementation. Seen by PT qualifies for SNF.  8. Hyponatremia. Worsened with IV fluids suggestive of SIADH, stop IV fluids, Lasix, check serum and urine osmolality, monitor BMP.    Diet : Diet Heart Room service appropriate? Yes; Fluid consistency: Thin    Family Communication  :  friend's bedside   Code Status :  DNR  Disposition Plan  :  SNF on 05-03-16 if sodium is better  Consults  :  None  Procedures  :  CT head and C-spine. Nonacute.  DVT Prophylaxis  :   Heparin   Lab Results  Component Value Date   PLT 229 04/29/2016    Inpatient Medications  Scheduled Meds: . amLODipine  2.5 mg Oral Daily  . aspirin EC  81 mg Oral Daily  . atorvastatin  10 mg Oral Daily  . bisacodyl  10 mg Oral Once  . bisacodyl  10 mg Rectal Daily  . collagenase   Topical Daily  . docusate sodium  200 mg Oral BID  . doxycycline  100 mg Oral Q12H  . feeding supplement (ENSURE ENLIVE)  237 mL Oral BID BM  . heparin subcutaneous  5,000 Units Subcutaneous Q8H  . levothyroxine  50 mcg Intravenous Daily  . potassium chloride  40 mEq Oral BID  . sertraline  25 mg Oral Daily  . sodium chloride flush  3 mL Intravenous Q12H   Continuous Infusions:  PRN Meds:.haloperidol lactate, hydrALAZINE, [DISCONTINUED] ondansetron **OR** ondansetron (ZOFRAN) IV  Antibiotics  :    Anti-infectives    Start     Dose/Rate Route Frequency Ordered Stop   05/01/16 1000  doxycycline (VIBRA-TABS) tablet 100 mg     100 mg Oral Every 12 hours 05/01/16 0921     04/29/16 2200  vancomycin (VANCOCIN) IVPB 750 mg/150 ml premix  Status:  Discontinued     750 mg 150 mL/hr over 60 Minutes Intravenous Every 24 hours 04/28/16 2228 04/29/16 1054   04/29/16 2200  vancomycin  (VANCOCIN) 1,250 mg in sodium chloride 0.9 % 250 mL IVPB  Status:  Discontinued     1,250 mg 166.7 mL/hr over 90 Minutes Intravenous Every 24 hours 04/29/16 1054 05/01/16 0921   04/28/16 2230  vancomycin (VANCOCIN) IVPB 1000 mg/200 mL premix     1,000 mg 200 mL/hr over 60 Minutes Intravenous  Once 04/28/16 2228 04/29/16 0114         Objective:   Vitals:   05/01/16 1029 05/01/16 1348 05/01/16 2232 05/02/16 0326  BP:  (!) 153/80 (!) 174/82 (!) 126/53  Pulse: (!) 52 (!) 58 65 (!) 57  Resp: 18 17 16 18   Temp: 97.5 F (36.4 C) 98.1 F (36.7 C) 98 F (36.7 C) 98.4 F (36.9 C)  TempSrc: Oral Oral Oral Oral  SpO2: 98% 97% 98% 95%  Weight:      Height:        Wt Readings from Last 3 Encounters:  04/29/16 68.2 kg (150 lb 4.8 oz)  01/11/14 72.4 kg (159 lb 9.6 oz)     Intake/Output Summary (Last 24 hours) at 05/02/16 0931 Last data filed at 05/01/16 1800  Gross per 24 hour  Intake          1246.75 ml  Output              590 ml  Net           656.75 ml     Physical Exam  Awake , Pleasantly confused, small occipital head laceration, No new F.N deficits, Normal affect [employer].AT,PERRAL Supple Neck,No JVD, No cervical lymphadenopathy appriciated.  Symmetrical Chest wall movement, Good air movement bilaterally, CTAB RRR,No Gallops,Rubs or new Murmurs, No Parasternal Heave +ve B.Sounds, Abd Soft, No tenderness, No organomegaly appriciated, No rebound - guarding or rigidity. No Cyanosis, Clubbing or edema,  No new Rash or bruise , R arm under bandage    Data Review:    CBC  Recent Labs Lab 04/28/16 1943 04/29/16 0656  WBC 6.6 6.1  HGB 11.6* 11.0*  HCT 34.4* 32.7*  PLT 236 229  MCV 92.7 93.2  MCH 31.3 31.3  MCHC 33.7 33.6  RDW 13.9 13.9  LYMPHSABS 1.0  --   MONOABS 0.7  --   EOSABS 0.1  --   BASOSABS 0.0  --     Chemistries   Recent Labs Lab 04/28/16 1943 04/29/16 0656 05/01/16 0234 05/02/16 0223  NA 131* 133* 130* 128*  K 4.5 3.6 3.6 3.4*  CL 94* 98* 96*  94*  CO2 26 24 25 25   GLUCOSE 94 74 83 93  BUN 14 11 8 10   CREATININE 1.20 1.05 0.92 0.90  CALCIUM 9.2 8.6* 8.3* 8.6*   ------------------------------------------------------------------------------------------------------------------ No results for input(s): CHOL, HDL, LDLCALC, TRIG, CHOLHDL, LDLDIRECT in the last 72 hours.  No results found for: HGBA1C ------------------------------------------------------------------------------------------------------------------ No results for input(s): TSH, T4TOTAL, T3FREE, THYROIDAB in the last 72 hours.  Invalid input(s): FREET3 ------------------------------------------------------------------------------------------------------------------ No results for input(s): VITAMINB12, FOLATE, FERRITIN, TIBC, IRON, RETICCTPCT in the last 72 hours.  Coagulation profile No results for input(s): INR, PROTIME in the last 168 hours.  No results for input(s): DDIMER in the last 72 hours.  Cardiac Enzymes No results for input(s): CKMB, TROPONINI, MYOGLOBIN in the last 168 hours.  Invalid input(s): CK ------------------------------------------------------------------------------------------------------------------ No results found for: BNP  Micro Results Recent Results (from the past 240 hour(s))  Blood culture (routine x 2)     Status: None (Preliminary result)   Collection Time: 04/28/16 10:20 PM  Result Value Ref Range Status   Specimen Description BLOOD LEFT ANTECUBITAL  Final   Special Requests   Final    BOTTLES DRAWN AEROBIC ONLY 5CC IN PEDIATRIC BOTTLE 2CC   Culture NO GROWTH 3 DAYS  Final   Report Status PENDING  Incomplete  Blood culture (routine x 2)     Status: None (Preliminary result)   Collection Time: 04/28/16 11:10 PM  Result Value Ref Range Status   Specimen Description BLOOD RIGHT ARM  Final   Special Requests BOTTLES DRAWN AEROBIC ONLY 5CC PT ON VANC  Final   Culture NO GROWTH 2 DAYS  Final   Report Status PENDING  Incomplete     Radiology Reports Dg Chest 2 View  Result Date: 04/28/2016 CLINICAL DATA:  Status post fall, with confusion and concern for chest injury. Initial encounter. EXAM: CHEST  2 VIEW COMPARISON:  None. FINDINGS: The lungs are well-aerated and clear. There is no evidence of focal opacification, pleural effusion or pneumothorax. The heart is borderline normal in size. No acute osseous abnormalities are seen. Scattered calcification is seen along the lower thoracic and abdominal aorta. IMPRESSION: 1. No acute cardiopulmonary process seen. No displaced rib fractures identified. 2. Scattered aortic atherosclerosis. Electronically Signed   By: Roanna RaiderJeffery  Chang M.D.   On: 04/28/2016 21:18   Dg Elbow Complete Right  Result Date: 04/28/2016 CLINICAL DATA:  Status post fall, with right posterior elbow abrasion and pain. Initial encounter. EXAM: RIGHT ELBOW - COMPLETE 3+ VIEW COMPARISON:  None. FINDINGS: There is no evidence of fracture or dislocation. The visualized joint spaces are preserved. No significant joint effusion is identified. The soft tissues are unremarkable in appearance Dorsal soft tissue air and swelling is noted, reflecting underlying laceration. No radiopaque foreign bodies are seen. IMPRESSION: 1. No evidence of fracture or  dislocation. 2. Dorsal soft tissue air and swelling noted, reflecting underlying laceration. Electronically Signed   By: Roanna Raider M.D.   On: 04/28/2016 21:11   Ct Head Wo Contrast  Result Date: 04/28/2016 CLINICAL DATA:  Fall, confusion EXAM: CT HEAD WITHOUT CONTRAST CT CERVICAL SPINE WITHOUT CONTRAST TECHNIQUE: Multidetector CT imaging of the head and cervical spine was performed following the standard protocol without intravenous contrast. Multiplanar CT image reconstructions of the cervical spine were also generated. COMPARISON:  None. FINDINGS: CT HEAD FINDINGS Brain: No evidence of acute infarction, hemorrhage, extra-axial collection or mass lesion/mass effect. Global  cortical and central atrophy. Secondary mild ventriculomegaly. Extensive subcortical white matter and periventricular small vessel ischemic changes. Vascular: Mild intracranial atherosclerosis. Skull: Normal. Negative for fracture or focal lesion. Sinuses/Orbits: The visualized paranasal sinuses are essentially clear. The mastoid air cells are unopacified. Other: None. CT CERVICAL SPINE FINDINGS Alignment: Normal cervical lordosis. Skull base and vertebrae: No acute fracture. No primary bone lesion or focal pathologic process. Soft tissues and spinal canal: No prevertebral fluid or swelling. No visible canal hematoma. Disc levels: Mild degenerative changes, most prominent at C4-5, C5-6, and C6-7. Spinal canal is patent. Upper chest: Visualized lung apices are clear. Other: Visualized thyroid is unremarkable. IMPRESSION: No evidence of acute intracranial abnormality. Atrophy with secondary ventriculomegaly. Extensive small vessel ischemic changes. No evidence of traumatic injury to the cervical spine. Mild multilevel degenerative changes. Electronically Signed   By: Charline Bills M.D.   On: 04/28/2016 20:56   Ct Cervical Spine Wo Contrast  Result Date: 04/28/2016 CLINICAL DATA:  Fall, confusion EXAM: CT HEAD WITHOUT CONTRAST CT CERVICAL SPINE WITHOUT CONTRAST TECHNIQUE: Multidetector CT imaging of the head and cervical spine was performed following the standard protocol without intravenous contrast. Multiplanar CT image reconstructions of the cervical spine were also generated. COMPARISON:  None. FINDINGS: CT HEAD FINDINGS Brain: No evidence of acute infarction, hemorrhage, extra-axial collection or mass lesion/mass effect. Global cortical and central atrophy. Secondary mild ventriculomegaly. Extensive subcortical white matter and periventricular small vessel ischemic changes. Vascular: Mild intracranial atherosclerosis. Skull: Normal. Negative for fracture or focal lesion. Sinuses/Orbits: The visualized  paranasal sinuses are essentially clear. The mastoid air cells are unopacified. Other: None. CT CERVICAL SPINE FINDINGS Alignment: Normal cervical lordosis. Skull base and vertebrae: No acute fracture. No primary bone lesion or focal pathologic process. Soft tissues and spinal canal: No prevertebral fluid or swelling. No visible canal hematoma. Disc levels: Mild degenerative changes, most prominent at C4-5, C5-6, and C6-7. Spinal canal is patent. Upper chest: Visualized lung apices are clear. Other: Visualized thyroid is unremarkable. IMPRESSION: No evidence of acute intracranial abnormality. Atrophy with secondary ventriculomegaly. Extensive small vessel ischemic changes. No evidence of traumatic injury to the cervical spine. Mild multilevel degenerative changes. Electronically Signed   By: Charline Bills M.D.   On: 04/28/2016 20:56    Time Spent in minutes  30   SINGH,PRASHANT K M.D on 05/02/2016 at 9:31 AM  Between 7am to 7pm - Pager - [phone] ( page via [facility], text pages only, please mention full 10 digit call back number).  After 7pm go to [email or web address] - password [facility]  [facility] -  Office  [phone]

## 2016-05-03 DIAGNOSIS — R2689 Other abnormalities of gait and mobility: Secondary | ICD-10-CM | POA: Diagnosis not present

## 2016-05-03 DIAGNOSIS — S0990XA Unspecified injury of head, initial encounter: Secondary | ICD-10-CM | POA: Diagnosis not present

## 2016-05-03 DIAGNOSIS — F0391 Unspecified dementia with behavioral disturbance: Secondary | ICD-10-CM | POA: Diagnosis not present

## 2016-05-03 DIAGNOSIS — K219 Gastro-esophageal reflux disease without esophagitis: Secondary | ICD-10-CM | POA: Diagnosis not present

## 2016-05-03 DIAGNOSIS — M353 Polymyalgia rheumatica: Secondary | ICD-10-CM | POA: Diagnosis not present

## 2016-05-03 DIAGNOSIS — E038 Other specified hypothyroidism: Secondary | ICD-10-CM | POA: Diagnosis not present

## 2016-05-03 DIAGNOSIS — M6281 Muscle weakness (generalized): Secondary | ICD-10-CM | POA: Diagnosis not present

## 2016-05-03 DIAGNOSIS — E781 Pure hyperglyceridemia: Secondary | ICD-10-CM | POA: Diagnosis not present

## 2016-05-03 DIAGNOSIS — L03113 Cellulitis of right upper limb: Secondary | ICD-10-CM | POA: Diagnosis not present

## 2016-05-03 DIAGNOSIS — R627 Adult failure to thrive: Secondary | ICD-10-CM | POA: Diagnosis not present

## 2016-05-03 DIAGNOSIS — F320 Major depressive disorder, single episode, mild: Secondary | ICD-10-CM | POA: Diagnosis not present

## 2016-05-03 DIAGNOSIS — L0390 Cellulitis, unspecified: Secondary | ICD-10-CM | POA: Diagnosis not present

## 2016-05-03 DIAGNOSIS — F3289 Other specified depressive episodes: Secondary | ICD-10-CM | POA: Diagnosis not present

## 2016-05-03 DIAGNOSIS — N179 Acute kidney failure, unspecified: Secondary | ICD-10-CM | POA: Diagnosis not present

## 2016-05-03 DIAGNOSIS — N400 Enlarged prostate without lower urinary tract symptoms: Secondary | ICD-10-CM | POA: Diagnosis not present

## 2016-05-03 DIAGNOSIS — E039 Hypothyroidism, unspecified: Secondary | ICD-10-CM | POA: Diagnosis not present

## 2016-05-03 DIAGNOSIS — R41841 Cognitive communication deficit: Secondary | ICD-10-CM | POA: Diagnosis not present

## 2016-05-03 DIAGNOSIS — E785 Hyperlipidemia, unspecified: Secondary | ICD-10-CM | POA: Diagnosis not present

## 2016-05-03 DIAGNOSIS — H81399 Other peripheral vertigo, unspecified ear: Secondary | ICD-10-CM | POA: Diagnosis not present

## 2016-05-03 DIAGNOSIS — R278 Other lack of coordination: Secondary | ICD-10-CM | POA: Diagnosis not present

## 2016-05-03 DIAGNOSIS — Z9181 History of falling: Secondary | ICD-10-CM | POA: Diagnosis not present

## 2016-05-03 DIAGNOSIS — F0280 Dementia in other diseases classified elsewhere without behavioral disturbance: Secondary | ICD-10-CM | POA: Diagnosis not present

## 2016-05-03 DIAGNOSIS — I10 Essential (primary) hypertension: Secondary | ICD-10-CM | POA: Diagnosis not present

## 2016-05-03 DIAGNOSIS — G609 Hereditary and idiopathic neuropathy, unspecified: Secondary | ICD-10-CM | POA: Diagnosis not present

## 2016-05-03 DIAGNOSIS — R4182 Altered mental status, unspecified: Secondary | ICD-10-CM | POA: Diagnosis not present

## 2016-05-03 DIAGNOSIS — R2681 Unsteadiness on feet: Secondary | ICD-10-CM | POA: Diagnosis not present

## 2016-05-03 LAB — BASIC METABOLIC PANEL
ANION GAP: 10 (ref 5–15)
BUN: 14 mg/dL (ref 6–20)
CALCIUM: 9.2 mg/dL (ref 8.9–10.3)
CHLORIDE: 97 mmol/L — AB (ref 101–111)
CO2: 24 mmol/L (ref 22–32)
Creatinine, Ser: 1.05 mg/dL (ref 0.61–1.24)
GFR calc non Af Amer: >60 mL/min (ref >60)
GLUCOSE: 97 mg/dL (ref 65–99)
Potassium: 4.6 mmol/L (ref 3.5–5.1)
Sodium: 131 mmol/L — ABNORMAL LOW (ref 135–145)

## 2016-05-03 LAB — CULTURE, BLOOD (ROUTINE X 2): CULTURE: NO GROWTH

## 2016-05-03 MED ORDER — DOXYCYCLINE HYCLATE 100 MG PO TABS: 100.0000 mg | ORAL_TABLET | Freq: Two times a day (BID) | ORAL | Status: DC

## 2016-05-03 MED ORDER — FUROSEMIDE 10 MG/ML IJ SOLN
40.0000 mg | Freq: Once | INTRAMUSCULAR | Status: AC
  Administered 2016-05-03: 40 mg via INTRAVENOUS
  Filled 2016-05-03: qty 4

## 2016-05-03 MED ORDER — DOCUSATE SODIUM 100 MG PO CAPS: 200.0000 mg | ORAL_CAPSULE | Freq: Two times a day (BID) | ORAL | 0 refills | Status: DC

## 2016-05-03 NOTE — Progress Notes (Signed)
Patient in a stable condition, tele dc ccmd notified, AVS printed,  patient belongings at bedside,  report [facility],

## 2016-05-03 NOTE — Clinical Social Work Placement (Signed)
   CLINICAL SOCIAL WORK PLACEMENT  NOTE  Date:  05/03/2016  Patient Details  Name: Benjamin Banks MRN: [id number] Date of Birth: 1925/05/15  Clinical Social Work is seeking post-discharge placement for this patient at the Skilled  Nursing Facility level of care (*CSW will initial, date and re-position this form in  chart as items are completed):  Yes   Patient/family provided with [facility] Clinical Social Work Department's list of facilities offering this level of care within the geographic area requested by the patient (or if unable, by the patient's family).  Yes   Patient/family informed of their freedom to choose among providers that offer the needed level of care, that participate in Medicare, Medicaid or managed care program needed by the patient, have an available bed and are willing to accept the patient.  Yes   Patient/family informed of [facility]'s ownership interest in [facility] and [facility], as well as of the fact that they are under no obligation to receive care at these facilities.  PASRR submitted to EDS on       PASRR number received on 04/29/16     Existing PASRR number confirmed on       FL2 transmitted to all facilities in geographic area requested by pt/family on       FL2 transmitted to all facilities within larger geographic area on       Patient informed that his/her managed care company has contracts with or will negotiate with certain facilities, including the following:        Yes   Patient/family informed of bed offers received.  [facility] at  [facility]     Physician recommends and patient chooses bed at      Patient to be transferred to  [facility] on 05/03/16.  Patient to be transferred to facility by  [facility]     Patient family notified on 05/03/16 of transfer.  Name of family member notified:  Benjamin Banks [phone]     PHYSICIAN Please sign FL2     Additional Comment:     _______________________________________________ Norlene DuelBROWN, Sakia Schrimpf B, LCSWA 05/03/2016, 2:35 PM

## 2016-05-03 NOTE — Progress Notes (Signed)
[facility]

## 2016-05-03 NOTE — Clinical Social Work Note (Signed)
Clinical Social Worker facilitated patient discharge including contacting patient family and facility to confirm patient discharge plans.  Clinical information faxed to facility and family agreeable with plan.  CSW arranged [facility] transport [facility]. RN to call report prior to discharge.  Clinical Social Worker will sign off for now as social work intervention is no longer needed. Please consult us again if new need arises.  Tamela Elsayed B. Gean QuintBrown,MSW, LCSWA Clinical Social Work Dept Weekend Social Worker [phone] 2:29 PM

## 2016-05-03 NOTE — Progress Notes (Signed)
Ordered pt a low bed from materials management. They stated that there are no low beds available at the moment and when one becomes available they will bring one up for the pt.

## 2016-05-03 NOTE — Discharge Summary (Addendum)
Benjamin Banks:[id number] DOB: Mar 03, 1925 DOA: 04/28/2016  PCP: Ginette Otto, MD  Admit date: 04/28/2016  Discharge date: 05/03/2016  Admitted From: Home   Disposition:  SNF   Recommendations for Outpatient Follow-up:   Follow up with PCP in 1-2 weeks  PCP Please obtain BMP/CBC, 2 view CXR in 1week,  (see Discharge instructions)   PCP Please follow up on the following pending results: Monitor BMP closely , check TSH in 3-4 weeks   Home Health: None Equipment/Devices: None  Consultations: None Discharge Condition: Fair   CODE STATUS: DNR   Diet Recommendation:  Heart Healthy , 1.5 L total fluid restriction per day   Chief Complaint  Patient presents with  . Fall     Brief history of present illness from the day of admission and additional interim summary     Benjamin Banks a 83 y.o.malewith medical history significant ofdementia, peripheral neuropathy, hypothyroidism, Gilbert's syndrome, BPH, anemia, vertigo,hypertension, hyperlipidemia, GERD, depression, PMR, who presents with failure to thrive, fall, right elbow wound infection                                                                 Hospital Course    1. Mechanical fall due to generalized weakness and deconditioning most likely due to underlying severe hypothyroidism causing a right elbow/arm laceration and mild cellulitis along with small head laceration. He was not septic, continue wound care at SNF infection almost completely resolved, 5 more days of oral doxycycline then stop, x-ray of the right elbow was unremarkable.  CT scan of head and C-spine unremarkable. Head laceration stable.  2. Severe hypothyroidism. TSH greater than 65, changed Synthroid to IV form and double the dose for a few days and monitor.Per family  friend's patient is noncompliant with his home dose Synthroid and possibly takes it once or twice a week. Likely will discharge on his home dose Synthroid with more emphasis on compliance.  3. Dementia. Remains at risk for delirium. Avoid benzodiazepines and narcotics. Continue supportive care, remains at risk for fall and aspiration.  4. Dyslipidemia. On statin continue.  5. Hypertension currently stable on Norvasc resume HCTZ.  6. Depression. On Zoloft continue.  7. Generalized weakness, failure to thrive, moderate protein calorie malnutrition. Possibly all due to severe hypothyroidism. Treatment as an #2 above along with supportive care and protein supplementation. Seen by PT qualifies for SNF.  8. 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 Monitor BMP closely at SNF. 1.5 L daily fluid restriction.   Discharge diagnosis     Principal Problem:   Fall Active Problems:   Hyperlipidemia   Essential hypertension   AKI (acute kidney injury) (HCC)   Dementia   Cellulitis of right upper extremity   Head injury   Failure to  thrive in adult   Depression   Hypothyroidism    Discharge instructions    Discharge Instructions    Discharge instructions    Complete by:  As directed    Follow with Primary MD Ginette Otto, MD in 7 days   Get CBC, CMP, 2 view Chest X ray checked  by Primary MD or SNF MD in 5-7 days ( we routinely change or add medications that can affect your baseline labs and fluid status, therefore we recommend that you get the mentioned basic workup next visit with your PCP, your PCP may decide not to get them or add new tests based on their clinical decision)  Activity: As tolerated with Full fall precautions use walker/cane & assistance as needed  Disposition SNF  Diet:  Heart Healthy - Check your Weight same time everyday, if you gain over 2 pounds, or you develop in leg swelling, experience more shortness of breath or  chest pain, call your Primary MD immediately. Follow Cardiac Low Salt Diet and 1.5 lit/day fluid restriction.  On your next visit with your primary care physician please Get Medicines reviewed and adjusted.  Please request your Prim.MD to go over all Hospital Tests and Procedure/Radiological results at the follow up, please get all Hospital records sent to your Prim MD by signing hospital release before you go home.  If you experience worsening of your admission symptoms, develop shortness of breath, life threatening emergency, suicidal or homicidal thoughts you must seek medical attention immediately by calling 911 or calling your MD immediately  if symptoms less severe.  You Must read complete instructions/literature along with all the possible adverse reactions/side effects for all the Medicines you take and that have been prescribed to you. Take any new Medicines after you have completely understood and accpet all the possible adverse reactions/side effects.   Do not drive, operate heavy machinery, perform activities at heights, swimming or participation in water activities or provide baby sitting services if your were admitted for syncope or siezures until you have seen by Primary MD or a Neurologist and advised to do so again.  Do not drive when taking Pain medications.    Do not take more than prescribed Pain, Sleep and Anxiety Medications  Special Instructions: If you have smoked or chewed Tobacco  in the last 2 yrs please stop smoking, stop any regular Alcohol  and or any Recreational drug use.  Wear Seat belts while driving.   Please note  You were cared for by a hospitalist during your hospital stay. If you have any questions about your discharge medications or the care you received while you were in the hospital after you are discharged, you can call the unit and asked to speak with the hospitalist on call if the hospitalist that took care of you is not available. Once you are  discharged, your primary care physician will handle any further medical issues. Please note that NO REFILLS for any discharge medications will be authorized once you are discharged, as it is imperative that you return to your primary care physician (or establish a relationship with a primary care physician if you do not have one) for your aftercare needs so that they can reassess your need for medications and monitor your lab values.   Increase activity slowly    Complete by:  As directed       Discharge Medications   Allergies as of 05/03/2016      Reactions   Ativan [lorazepam]  Somnolence       Medication List    TAKE these medications   amLODipine 2.5 MG tablet Commonly known as:  NORVASC Take 2.5 mg by mouth daily.   aspirin 81 MG tablet Take 81 mg by mouth daily.   atorvastatin 10 MG tablet Commonly known as:  LIPITOR Take 10 mg by mouth daily.   docusate sodium 100 MG capsule Commonly known as:  COLACE Take 2 capsules (200 mg total) by mouth 2 (two) times daily.   doxycycline 100 MG tablet Commonly known as:  VIBRA-TABS Take 1 tablet (100 mg total) by mouth every 12 (twelve) hours. 5 days   hydrochlorothiazide 12.5 MG capsule Commonly known as:  MICROZIDE Take 12.5 mg by mouth daily.   sertraline 25 MG tablet Commonly known as:  ZOLOFT Take 25 mg by mouth daily.   SYNTHROID 50 MCG tablet Generic drug:  levothyroxine Take 50 mcg by mouth daily before breakfast.       Follow-up Information    Ginette OttoSTONEKING,HAL THOMAS, MD. Schedule an appointment as soon as possible for a visit in 1 week(s).   Specialty:  Internal Medicine Contact information: [address] [place] [address] [phone]           Major procedures and Radiology Reports - PLEASE review detailed and final reports thoroughly  -     CT head and C-spine. Nonacute.  Dg Chest 2 View  Result Date: 04/28/2016 CLINICAL DATA:  Status post fall, with confusion and concern  for chest injury. Initial encounter. EXAM: CHEST  2 VIEW COMPARISON:  None. FINDINGS: The lungs are well-aerated and clear. There is no evidence of focal opacification, pleural effusion or pneumothorax. The heart is borderline normal in size. No acute osseous abnormalities are seen. Scattered calcification is seen along the lower thoracic and abdominal aorta. IMPRESSION: 1. No acute cardiopulmonary process seen. No displaced rib fractures identified. 2. Scattered aortic atherosclerosis. Electronically Signed   By: Roanna RaiderJeffery  Chang M.D.   On: 04/28/2016 21:18   Dg Elbow Complete Right  Result Date: 04/28/2016 CLINICAL DATA:  Status post fall, with right posterior elbow abrasion and pain. Initial encounter. EXAM: RIGHT ELBOW - COMPLETE 3+ VIEW COMPARISON:  None. FINDINGS: There is no evidence of fracture or dislocation. The visualized joint spaces are preserved. No significant joint effusion is identified. The soft tissues are unremarkable in appearance Dorsal soft tissue air and swelling is noted, reflecting underlying laceration. No radiopaque foreign bodies are seen. IMPRESSION: 1. No evidence of fracture or dislocation. 2. Dorsal soft tissue air and swelling noted, reflecting underlying laceration. Electronically Signed   By: Roanna RaiderJeffery  Chang M.D.   On: 04/28/2016 21:11   Ct Head Wo Contrast  Result Date: 04/28/2016 CLINICAL DATA:  Fall, confusion EXAM: CT HEAD WITHOUT CONTRAST CT CERVICAL SPINE WITHOUT CONTRAST TECHNIQUE: Multidetector CT imaging of the head and cervical spine was performed following the standard protocol without intravenous contrast. Multiplanar CT image reconstructions of the cervical spine were also generated. COMPARISON:  None. FINDINGS: CT HEAD FINDINGS Brain: No evidence of acute infarction, hemorrhage, extra-axial collection or mass lesion/mass effect. Global cortical and central atrophy. Secondary mild ventriculomegaly. Extensive subcortical white matter and periventricular small  vessel ischemic changes. Vascular: Mild intracranial atherosclerosis. Skull: Normal. Negative for fracture or focal lesion. Sinuses/Orbits: The visualized paranasal sinuses are essentially clear. The mastoid air cells are unopacified. Other: None. CT CERVICAL SPINE FINDINGS Alignment: Normal cervical lordosis. Skull base and vertebrae: No acute fracture. No primary bone lesion  or focal pathologic process. Soft tissues and spinal canal: No prevertebral fluid or swelling. No visible canal hematoma. Disc levels: Mild degenerative changes, most prominent at C4-5, C5-6, and C6-7. Spinal canal is patent. Upper chest: Visualized lung apices are clear. Other: Visualized thyroid is unremarkable. IMPRESSION: No evidence of acute intracranial abnormality. Atrophy with secondary ventriculomegaly. Extensive small vessel ischemic changes. No evidence of traumatic injury to the cervical spine. Mild multilevel degenerative changes. Electronically Signed   By: Charline Bills M.D.   On: 04/28/2016 20:56   Ct Cervical Spine Wo Contrast  Result Date: 04/28/2016 CLINICAL DATA:  Fall, confusion EXAM: CT HEAD WITHOUT CONTRAST CT CERVICAL SPINE WITHOUT CONTRAST TECHNIQUE: Multidetector CT imaging of the head and cervical spine was performed following the standard protocol without intravenous contrast. Multiplanar CT image reconstructions of the cervical spine were also generated. COMPARISON:  None. FINDINGS: CT HEAD FINDINGS Brain: No evidence of acute infarction, hemorrhage, extra-axial collection or mass lesion/mass effect. Global cortical and central atrophy. Secondary mild ventriculomegaly. Extensive subcortical white matter and periventricular small vessel ischemic changes. Vascular: Mild intracranial atherosclerosis. Skull: Normal. Negative for fracture or focal lesion. Sinuses/Orbits: The visualized paranasal sinuses are essentially clear. The mastoid air cells are unopacified. Other: None. CT CERVICAL SPINE FINDINGS  Alignment: Normal cervical lordosis. Skull base and vertebrae: No acute fracture. No primary bone lesion or focal pathologic process. Soft tissues and spinal canal: No prevertebral fluid or swelling. No visible canal hematoma. Disc levels: Mild degenerative changes, most prominent at C4-5, C5-6, and C6-7. Spinal canal is patent. Upper chest: Visualized lung apices are clear. Other: Visualized thyroid is unremarkable. IMPRESSION: No evidence of acute intracranial abnormality. Atrophy with secondary ventriculomegaly. Extensive small vessel ischemic changes. No evidence of traumatic injury to the cervical spine. Mild multilevel degenerative changes. Electronically Signed   By: Charline Bills M.D.   On: 04/28/2016 20:56    Micro Results     Recent Results (from the past 240 hour(s))  Blood culture (routine x 2)     Status: None (Preliminary result)   Collection Time: 04/28/16 10:20 PM  Result Value Ref Range Status   Specimen Description BLOOD LEFT ANTECUBITAL  Final   Special Requests   Final    BOTTLES DRAWN AEROBIC ONLY 5CC IN PEDIATRIC BOTTLE 2CC   Culture NO GROWTH 4 DAYS  Final   Report Status PENDING  Incomplete  Blood culture (routine x 2)     Status: None (Preliminary result)   Collection Time: 04/28/16 11:10 PM  Result Value Ref Range Status   Specimen Description BLOOD RIGHT ARM  Final   Special Requests BOTTLES DRAWN AEROBIC ONLY 5CC PT ON VANC  Final   Culture NO GROWTH 3 DAYS  Final   Report Status PENDING  Incomplete    Today   Subjective    Harm Bendickson today has no headache,no chest abdominal pain,no new weakness tingling or numbness, feels much better wants to go home today.    Objective   Blood pressure (!) 168/87, pulse 83, temperature (!) 96.1 F (35.6 C), temperature source Oral, resp. rate 18, height 5\' 8"  (1.727 m), weight 68.2 kg (150 lb 4.8 oz), SpO2 100 %.   Intake/Output Summary (Last 24 hours) at 05/03/16 0949 Last data filed at 05/03/16 0901   Gross per 24 hour  Intake              600 ml  Output              550 ml  Net               50 ml    Exam Awake , Pleasantly confused, No new F.N deficits, Normal affect Head laceration healing well,PERRAL Supple Neck,No JVD, No cervical lymphadenopathy appriciated.  Symmetrical Chest wall movement, Good air movement bilaterally, CTAB RRR,No Gallops,Rubs or new Murmurs, No Parasternal Heave +ve B.Sounds, Abd Soft, Non tender, No organomegaly appriciated, No rebound -guarding or rigidity. No Cyanosis, Clubbing or edema, No new Rash or bruise MRI time under bandage no surrounding cellulitis,     Data Review   CBC w Diff: Lab Results  Component Value Date   WBC 6.1 04/29/2016   HGB 11.0 (L) 04/29/2016   HCT 32.7 (L) 04/29/2016   PLT 229 04/29/2016   LYMPHOPCT 15 04/28/2016   MONOPCT 10 04/28/2016   EOSPCT 1 04/28/2016   BASOPCT 0 04/28/2016    CMP: Lab Results  Component Value Date   NA 131 (L) 05/03/2016   K 4.6 05/03/2016   CL 97 (L) 05/03/2016   CO2 24 05/03/2016   BUN 14 05/03/2016   CREATININE 1.05 05/03/2016  .   Total Time in preparing paper work, data evaluation and todays exam - 35 minutes  Leroy Sea M.D on 05/03/2016 at 9:49 AM  [facility]   Office  [phone]

## 2016-05-03 NOTE — Discharge Instructions (Signed)
Follow with Primary MD Ginette OttoSTONEKING,HAL THOMAS, MD in 7 days   Get CBC, CMP, 2 view Chest X ray checked  by Primary MD or SNF MD in 5-7 days ( we routinely change or add medications that can affect your baseline labs and fluid status, therefore we recommend that you get the mentioned basic workup next visit with your PCP, your PCP may decide not to get them or add new tests based on their clinical decision)  Activity: As tolerated with Full fall precautions use walker/cane & assistance as needed  Disposition SNF  Diet:  Heart Healthy - Check your Weight same time everyday, if you gain over 2 pounds, or you develop in leg swelling, experience more shortness of breath or chest pain, call your Primary MD immediately. Follow Cardiac Low Salt Diet and 1.5 lit/day fluid restriction.  On your next visit with your primary care physician please Get Medicines reviewed and adjusted.  Please request your Prim.MD to go over all Hospital Tests and Procedure/Radiological results at the follow up, please get all Hospital records sent to your Prim MD by signing hospital release before you go home.  If you experience worsening of your admission symptoms, develop shortness of breath, life threatening emergency, suicidal or homicidal thoughts you must seek medical attention immediately by calling 911 or calling your MD immediately  if symptoms less severe.  You Must read complete instructions/literature along with all the possible adverse reactions/side effects for all the Medicines you take and that have been prescribed to you. Take any new Medicines after you have completely understood and accpet all the possible adverse reactions/side effects.   Do not drive, operate heavy machinery, perform activities at heights, swimming or participation in water activities or provide baby sitting services if your were admitted for syncope or siezures until you have seen by Primary MD or a Neurologist and advised to do so  again.  Do not drive when taking Pain medications.    Do not take more than prescribed Pain, Sleep and Anxiety Medications  Special Instructions: If you have smoked or chewed Tobacco  in the last 2 yrs please stop smoking, stop any regular Alcohol  and or any Recreational drug use.  Wear Seat belts while driving.   Please note  You were cared for by a hospitalist during your hospital stay. If you have any questions about your discharge medications or the care you received while you were in the hospital after you are discharged, you can call the unit and asked to speak with the hospitalist on call if the hospitalist that took care of you is not available. Once you are discharged, your primary care physician will handle any further medical issues. Please note that NO REFILLS for any discharge medications will be authorized once you are discharged, as it is imperative that you return to your primary care physician (or establish a relationship with a primary care physician if you do not have one) for your aftercare needs so that they can reassess your need for medications and monitor your lab values.

## 2016-05-04 LAB — CULTURE, BLOOD (ROUTINE X 2): Culture: NO GROWTH

## 2016-05-04 NOTE — Care Management Note (Signed)
Case Management Note Donn PieriniKristi Marysue Fait RN, BSN Unit 2W-Case Manager [phone]  Patient Details  Name: Benjamin Banks MRN: [id number] Date of Birth: 1925/10/06  Subjective/Objective:  Pt admitted with fall from home                   Action/Plan: PTA pt lived at home alone, PT recommendations for SNF, spoke with pt's niece at bedside along with pt who is agreeable at this time to rehab "if that is what is needed"- niece would like to speak with CSW- have notified CSW that family is at bedside and pt's need for possible placement.   Expected Discharge Date:  05/03/16               Expected Discharge Plan:  Skilled Nursing Facility  In-House Referral:  Clinical Social Work  Discharge planning Services  CM Consult  Post Acute Care Choice:  NA Choice offered to:  NA  DME Arranged:    DME Agency:     HH Arranged:    HH Agency:     Status of Service:  Completed, signed off  If discussed at [employer] Meetings, dates discussed:    Discharge Disposition: skilled facility   Additional Comments:  05/01/16- 1130- Donn PieriniKristi Massimiliano Rohleder RN, CM- pt continues to need sitter, have spoken with family- who have arranged to have someone here around the clock in shifts to assist in getting rid of sitter to facilitate d/c plans to SNF- hopeful for d/c tomorrow if pt medically stable for d/c to SNF- CSW following to assist in placement- CM provided family with list of private duty agencies per request.   Zenda AlpersWebster, Lenn SinkKristi Hall, RN 05/04/2016, 11:00 AM

## 2016-05-04 NOTE — Progress Notes (Signed)
CM notes pt to go to [facility]; CSW aware and arranging [facility]. No other Cm needs were communicated.

## 2016-05-07 DIAGNOSIS — F320 Major depressive disorder, single episode, mild: Secondary | ICD-10-CM | POA: Diagnosis not present

## 2016-05-07 DIAGNOSIS — I10 Essential (primary) hypertension: Secondary | ICD-10-CM | POA: Diagnosis not present

## 2016-05-07 DIAGNOSIS — F0280 Dementia in other diseases classified elsewhere without behavioral disturbance: Secondary | ICD-10-CM | POA: Diagnosis not present

## 2016-05-07 DIAGNOSIS — E038 Other specified hypothyroidism: Secondary | ICD-10-CM | POA: Diagnosis not present

## 2016-05-09 ENCOUNTER — Ambulatory Visit: Payer: Self-pay | Admitting: Podiatry

## 2016-05-30 DIAGNOSIS — E038 Other specified hypothyroidism: Secondary | ICD-10-CM | POA: Diagnosis not present

## 2016-05-30 DIAGNOSIS — I10 Essential (primary) hypertension: Secondary | ICD-10-CM | POA: Diagnosis not present

## 2016-05-30 DIAGNOSIS — F0280 Dementia in other diseases classified elsewhere without behavioral disturbance: Secondary | ICD-10-CM | POA: Diagnosis not present

## 2016-05-30 DIAGNOSIS — F320 Major depressive disorder, single episode, mild: Secondary | ICD-10-CM | POA: Diagnosis not present

## 2016-06-18 DIAGNOSIS — E038 Other specified hypothyroidism: Secondary | ICD-10-CM | POA: Diagnosis not present

## 2016-06-18 DIAGNOSIS — F0280 Dementia in other diseases classified elsewhere without behavioral disturbance: Secondary | ICD-10-CM | POA: Diagnosis not present

## 2016-06-18 DIAGNOSIS — I10 Essential (primary) hypertension: Secondary | ICD-10-CM | POA: Diagnosis not present

## 2016-06-18 DIAGNOSIS — M353 Polymyalgia rheumatica: Secondary | ICD-10-CM | POA: Diagnosis not present

## 2016-06-20 DIAGNOSIS — E039 Hypothyroidism, unspecified: Secondary | ICD-10-CM | POA: Diagnosis not present

## 2016-06-20 DIAGNOSIS — E781 Pure hyperglyceridemia: Secondary | ICD-10-CM | POA: Diagnosis not present

## 2016-06-20 DIAGNOSIS — I10 Essential (primary) hypertension: Secondary | ICD-10-CM | POA: Diagnosis not present

## 2016-06-20 DIAGNOSIS — F330 Major depressive disorder, recurrent, mild: Secondary | ICD-10-CM | POA: Diagnosis not present

## 2016-06-20 DIAGNOSIS — Z9181 History of falling: Secondary | ICD-10-CM | POA: Diagnosis not present

## 2016-06-20 DIAGNOSIS — R627 Adult failure to thrive: Secondary | ICD-10-CM | POA: Diagnosis not present

## 2016-06-20 DIAGNOSIS — K5901 Slow transit constipation: Secondary | ICD-10-CM | POA: Diagnosis not present

## 2016-06-20 DIAGNOSIS — R2681 Unsteadiness on feet: Secondary | ICD-10-CM | POA: Diagnosis not present

## 2016-06-20 DIAGNOSIS — F0391 Unspecified dementia with behavioral disturbance: Secondary | ICD-10-CM | POA: Diagnosis not present

## 2016-06-24 DIAGNOSIS — F338 Other recurrent depressive disorders: Secondary | ICD-10-CM | POA: Diagnosis not present

## 2016-07-02 DIAGNOSIS — F0391 Unspecified dementia with behavioral disturbance: Secondary | ICD-10-CM | POA: Diagnosis not present

## 2016-07-02 DIAGNOSIS — Z7982 Long term (current) use of aspirin: Secondary | ICD-10-CM | POA: Diagnosis not present

## 2016-07-02 DIAGNOSIS — R627 Adult failure to thrive: Secondary | ICD-10-CM | POA: Diagnosis not present

## 2016-07-02 DIAGNOSIS — R262 Difficulty in walking, not elsewhere classified: Secondary | ICD-10-CM | POA: Diagnosis not present

## 2016-07-02 DIAGNOSIS — Z9181 History of falling: Secondary | ICD-10-CM | POA: Diagnosis not present

## 2016-07-02 DIAGNOSIS — I10 Essential (primary) hypertension: Secondary | ICD-10-CM | POA: Diagnosis not present

## 2016-07-06 ENCOUNTER — Emergency Department (HOSPITAL_COMMUNITY)
Admission: EM | Admit: 2016-07-06 | Discharge: 2016-07-06 | Disposition: A | Discharge status: Skilled Nursing Facility | Payer: Medicare Other | Attending: Emergency Medicine | Admitting: Emergency Medicine

## 2016-07-06 ENCOUNTER — Emergency Department (HOSPITAL_COMMUNITY): Payer: Medicare Other

## 2016-07-06 ENCOUNTER — Encounter (HOSPITAL_COMMUNITY): Payer: Self-pay

## 2016-07-06 DIAGNOSIS — Z87891 Personal history of nicotine dependence: Secondary | ICD-10-CM | POA: Diagnosis not present

## 2016-07-06 DIAGNOSIS — W19XXXA Unspecified fall, initial encounter: Secondary | ICD-10-CM | POA: Insufficient documentation

## 2016-07-06 DIAGNOSIS — Y92129 Unspecified place in nursing home as the place of occurrence of the external cause: Secondary | ICD-10-CM | POA: Diagnosis not present

## 2016-07-06 DIAGNOSIS — Z7409 Other reduced mobility: Secondary | ICD-10-CM | POA: Diagnosis not present

## 2016-07-06 DIAGNOSIS — E039 Hypothyroidism, unspecified: Secondary | ICD-10-CM | POA: Insufficient documentation

## 2016-07-06 DIAGNOSIS — S0532XA Ocular laceration without prolapse or loss of intraocular tissue, left eye, initial encounter: Secondary | ICD-10-CM | POA: Diagnosis not present

## 2016-07-06 DIAGNOSIS — Y999 Unspecified external cause status: Secondary | ICD-10-CM | POA: Diagnosis not present

## 2016-07-06 DIAGNOSIS — S0181XA Laceration without foreign body of other part of head, initial encounter: Secondary | ICD-10-CM | POA: Insufficient documentation

## 2016-07-06 DIAGNOSIS — Y939 Activity, unspecified: Secondary | ICD-10-CM | POA: Insufficient documentation

## 2016-07-06 DIAGNOSIS — I10 Essential (primary) hypertension: Secondary | ICD-10-CM | POA: Insufficient documentation

## 2016-07-06 DIAGNOSIS — S098XXA Other specified injuries of head, initial encounter: Secondary | ICD-10-CM | POA: Diagnosis not present

## 2016-07-06 DIAGNOSIS — Z79899 Other long term (current) drug therapy: Secondary | ICD-10-CM | POA: Insufficient documentation

## 2016-07-06 DIAGNOSIS — S0993XA Unspecified injury of face, initial encounter: Secondary | ICD-10-CM | POA: Diagnosis not present

## 2016-07-06 DIAGNOSIS — S0191XA Laceration without foreign body of unspecified part of head, initial encounter: Secondary | ICD-10-CM | POA: Diagnosis not present

## 2016-07-06 DIAGNOSIS — S022XXA Fracture of nasal bones, initial encounter for closed fracture: Secondary | ICD-10-CM | POA: Diagnosis not present

## 2016-07-06 DIAGNOSIS — S0990XA Unspecified injury of head, initial encounter: Secondary | ICD-10-CM | POA: Diagnosis not present

## 2016-07-06 DIAGNOSIS — S01112A Laceration without foreign body of left eyelid and periocular area, initial encounter: Secondary | ICD-10-CM | POA: Diagnosis not present

## 2016-07-06 MED ORDER — LIDOCAINE-EPINEPHRINE (PF) 2 %-1:200000 IJ SOLN
INTRAMUSCULAR | Status: AC
  Administered 2016-07-06: 10:00:00
  Filled 2016-07-06: qty 20

## 2016-07-06 NOTE — ED Triage Notes (Signed)
[facility] fell found on floor unknown LOC pt has dementia pt per facility is at his baseline with laceration to left eye area and skin tear and right elbow and right knee skin tear no other injuries noted.

## 2016-07-06 NOTE — ED Notes (Signed)
[facility] [place] [facility]

## 2016-07-06 NOTE — Discharge Instructions (Signed)
Sutures should dissolve on their own over the course of 1 month. Keep the cut clean and dry.

## 2016-07-06 NOTE — ED Provider Notes (Signed)
[facility]-EMERGENCY DEPT Provider Note   CSN: [id number] Arrival date & time: 07/06/16  0458     History   Chief Complaint Chief Complaint  Patient presents with  . Fall    HPI Benjamin Banks is a 83 y.o. male.  HPI PT comes in after a fall. LEVEL 5 CAVEAT FOR DEMENTIA.  Pt has hx of recurrent falls and had an unwitnessed fall today. Pt has a cut to the L forehead. He is not on blood thinners. Pt has no complains.  Past Medical History:  Diagnosis Date  . Anemia   . Benign positional vertigo   . Bilateral carpal tunnel syndrome   . BPH (benign prostatic hyperplasia)   . Chest pain    noncardiac  . Chronic anal fissure   . Chronic low back pain   . Dementia   . Disease    perrone  . Diverticulosis   . DJD (degenerative joint disease)   . Elevated blood sugar   . GERD (gastroesophageal reflux disease)   . Gilbert's syndrome   . H/O thyroidectomy   . Heart murmur   . Hypercholesteremia   . Hypertension    labils  . Hyperthyroidism   . Iron deficiency   . Lichen planus   . Peripheral neuropathy   . Polymyalgia rheumatica [facility]     Patient Active Problem List   Diagnosis Date Noted  . AKI (acute kidney injury) (HCC) 04/29/2016  . Dementia 04/29/2016  . Cellulitis of right upper extremity 04/29/2016  . Failure to thrive in adult 04/29/2016  . Depression 04/29/2016  . Hypothyroidism 04/29/2016  . Head injury   . Fall 04/28/2016  . Exertional chest pain 01/11/2014  . Angina decubitus (HCC) 01/11/2014  . Hyperlipidemia 01/11/2014  . Essential hypertension 01/11/2014    Past Surgical History:  Procedure Laterality Date  . APPENDECTOMY    . bottom teeth removed with implants    . hemrrhoidectomy    . left and right cataract    . left lens implant    . THYROIDECTOMY, PARTIAL    . TONSILLECTOMY         Home Medications    Prior to Admission medications   Medication Sig Start Date End Date Taking? Authorizing Provider  Amino Acids-Protein  Hydrolys (FEEDING SUPPLEMENT, PRO-STAT SUGAR FREE 64,) LIQD Take 30 mLs by mouth 2 (two) times daily.   Yes [provider]  aspirin 81 MG tablet Take 81 mg by mouth daily.   Yes [provider]  atorvastatin (LIPITOR) 10 MG tablet Take 10 mg by mouth daily.   Yes [provider]  docusate sodium (COLACE) 100 MG capsule Take 2 capsules (200 mg total) by mouth 2 (two) times daily. Patient taking differently: Take 200 mg by mouth every morning.  05/03/16  Yes Leroy Sea, MD  hydrochlorothiazide (MICROZIDE) 12.5 MG capsule Take 12.5 mg by mouth daily.   Yes [provider]  Multiple Vitamins-Minerals (CERTAGEN PO) Take 1 tablet by mouth daily.   Yes [provider]  sertraline (ZOLOFT) 25 MG tablet Take 25 mg by mouth daily.   Yes [provider]  SYNTHROID 50 MCG tablet Take 50 mcg by mouth daily before breakfast.  12/26/13  Yes [provider]  doxycycline (VIBRA-TABS) 100 MG tablet Take 1 tablet (100 mg total) by mouth every 12 (twelve) hours. 5 days Patient not taking: Reported on 07/06/2016 05/03/16   Leroy Sea, MD    Family History Family History  Problem  Relation Age of Onset  . COPD Mother   . COPD Father   . Alcohol abuse Father     Social History Social History  Substance Use Topics  . Smoking status: Former Games developermoker  . Smokeless tobacco: Never Used  . Alcohol use Yes     Comment: RARE     Allergies   Ativan [lorazepam]   Review of Systems Review of Systems  Unable to perform ROS: Dementia     Physical Exam Updated Vital Signs BP (!) 113/58 (BP Location: Left Arm)   Pulse (!) 54   Temp 98.2 F (36.8 C)   Resp 18   Ht 5\' 9"  (1.753 m)   Wt 68 kg (150 lb)   SpO2 98%   BMI 22.15 kg/m   Physical Exam  Constitutional: He appears well-developed.  HENT:  Head: Atraumatic.  4 cm laceration over the L eyebrow  Neck: Neck supple.  Cardiovascular: Normal rate.   Pulmonary/Chest: Effort  normal.  Abdominal: Soft.  Musculoskeletal: Normal range of motion. He exhibits no tenderness or deformity.  Head to toe evaluation shows no hematoma, bleeding of the scalp, no facial abrasions, no spine step offs, crepitus of the chest or neck, no tenderness to palpation of the bilateral upper and lower extremities, no gross deformities, no chest tenderness, no pelvic pain.   Neurological: He is alert.  Skin: Skin is warm.  Nursing note and vitals reviewed.    ED Treatments / Results  Labs (all labs ordered are listed, but only abnormal results are displayed) Labs Reviewed - No data to display  EKG  EKG Interpretation None       Radiology Ct Head Wo Contrast  Result Date: 07/06/2016 CLINICAL DATA:  Unwitnessed fall, found down, dementia, laceration to left eye EXAM: CT HEAD WITHOUT CONTRAST TECHNIQUE: Contiguous axial images were obtained from the base of the skull through the vertex without intravenous contrast. COMPARISON:  04/28/2016 FINDINGS: Brain: No evidence of acute infarction, hemorrhage, extra-axial collection or mass lesion/mass effect. Global cortical and central atrophy.  Secondary ventriculomegaly. Subcortical white matter and periventricular small vessel ischemic changes. Vascular: Mild intracranial atherosclerosis. Skull: No evidence of calvarial fracture. Left nasal bone fracture (series 3/ image 6), new from the prior, although without overlying soft tissue swelling. Sinuses/Orbits: The visualized paranasal sinuses are essentially clear. The mastoid air cells are unopacified. The bilateral orbits, including the globes and retroconal soft tissues, are within normal limits. Other: Soft tissue laceration overlying the left frontal bone (series 3/ image 17). IMPRESSION: Soft tissue laceration overlying the left frontal bone. No evidence of calvarial fracture. Left nasal bone fracture, new from the prior, although not favored to reflect an acute injury given lack of overlying  soft tissue swelling. No evidence of acute intracranial abnormality. Atrophy with secondary ventriculomegaly. Small vessel ischemic changes. Electronically Signed   By: Charline BillsSriyesh  Krishnan M.D.   On: 07/06/2016 07:27    Procedures Procedures (including critical care time)  LACERATION REPAIR Performed by: Derwood KaplanNanavati, Blasa Raisch Authorized by: Derwood KaplanNanavati, [place] Sloop Consent: Verbal consent obtained. Risks and benefits: risks, benefits and alternatives were discussed Consent given by: patient Patient identity confirmed: provided demographic data Prepped and Draped in normal sterile fashion Wound explored  Laceration Location: left forehead  Laceration Length: 4 cm  No Foreign Bodies seen or palpated  Anesthesia: local infiltration  Local anesthetic: lidocaine 1 % with  epinephrine  Anesthetic total: 3 ml  Irrigation method: syringe Amount of cleaning: standard  Skin closure: primary  Number of sutures:  8 x 5-0 vicryl  Technique: simple inturrupted  Patient tolerance: Patient tolerated the procedure well with no immediate complications.   Medications Ordered in ED Medications  lidocaine-EPINEPHrine (XYLOCAINE W/EPI) 2 %-1:200000 (PF) injection (not administered)     Initial Impression / Assessment and Plan / ED Course  I have reviewed the triage vital signs and the nursing notes.  Pertinent labs & imaging results that were available during my care of the patient were reviewed by me and considered in my medical decision making (see chart for details).     Pt has a fall with resultant head trauma. CT head ordered and is neg. Lac to be repaired. Pt is utd with immunization per wife. Pt is not appearing to be tender over the pelvis and ROM of the hip is normal.  Final Clinical Impressions(s) / ED Diagnoses   Final diagnoses:  Facial laceration, initial encounter  Fall, initial encounter    New Prescriptions New Prescriptions   No medications on file     Derwood Kaplan,  MD 07/06/16 [phone]

## 2016-07-08 DIAGNOSIS — R627 Adult failure to thrive: Secondary | ICD-10-CM | POA: Diagnosis not present

## 2016-07-08 DIAGNOSIS — Z7982 Long term (current) use of aspirin: Secondary | ICD-10-CM | POA: Diagnosis not present

## 2016-07-08 DIAGNOSIS — Z9181 History of falling: Secondary | ICD-10-CM | POA: Diagnosis not present

## 2016-07-08 DIAGNOSIS — I10 Essential (primary) hypertension: Secondary | ICD-10-CM | POA: Diagnosis not present

## 2016-07-08 DIAGNOSIS — F0391 Unspecified dementia with behavioral disturbance: Secondary | ICD-10-CM | POA: Diagnosis not present

## 2016-07-08 DIAGNOSIS — R262 Difficulty in walking, not elsewhere classified: Secondary | ICD-10-CM | POA: Diagnosis not present

## 2016-07-09 DIAGNOSIS — R627 Adult failure to thrive: Secondary | ICD-10-CM | POA: Diagnosis not present

## 2016-07-09 DIAGNOSIS — Z9181 History of falling: Secondary | ICD-10-CM | POA: Diagnosis not present

## 2016-07-09 DIAGNOSIS — I10 Essential (primary) hypertension: Secondary | ICD-10-CM | POA: Diagnosis not present

## 2016-07-09 DIAGNOSIS — F0391 Unspecified dementia with behavioral disturbance: Secondary | ICD-10-CM | POA: Diagnosis not present

## 2016-07-09 DIAGNOSIS — Z7982 Long term (current) use of aspirin: Secondary | ICD-10-CM | POA: Diagnosis not present

## 2016-07-09 DIAGNOSIS — R262 Difficulty in walking, not elsewhere classified: Secondary | ICD-10-CM | POA: Diagnosis not present

## 2016-07-10 DIAGNOSIS — R262 Difficulty in walking, not elsewhere classified: Secondary | ICD-10-CM | POA: Diagnosis not present

## 2016-07-10 DIAGNOSIS — Z7982 Long term (current) use of aspirin: Secondary | ICD-10-CM | POA: Diagnosis not present

## 2016-07-10 DIAGNOSIS — F330 Major depressive disorder, recurrent, mild: Secondary | ICD-10-CM | POA: Diagnosis not present

## 2016-07-10 DIAGNOSIS — Z9181 History of falling: Secondary | ICD-10-CM | POA: Diagnosis not present

## 2016-07-10 DIAGNOSIS — I10 Essential (primary) hypertension: Secondary | ICD-10-CM | POA: Diagnosis not present

## 2016-07-10 DIAGNOSIS — F0391 Unspecified dementia with behavioral disturbance: Secondary | ICD-10-CM | POA: Diagnosis not present

## 2016-07-10 DIAGNOSIS — F0390 Unspecified dementia without behavioral disturbance: Secondary | ICD-10-CM | POA: Diagnosis not present

## 2016-07-10 DIAGNOSIS — R627 Adult failure to thrive: Secondary | ICD-10-CM | POA: Diagnosis not present

## 2016-07-15 DIAGNOSIS — R262 Difficulty in walking, not elsewhere classified: Secondary | ICD-10-CM | POA: Diagnosis not present

## 2016-07-15 DIAGNOSIS — Z7982 Long term (current) use of aspirin: Secondary | ICD-10-CM | POA: Diagnosis not present

## 2016-07-15 DIAGNOSIS — Z9181 History of falling: Secondary | ICD-10-CM | POA: Diagnosis not present

## 2016-07-15 DIAGNOSIS — F0391 Unspecified dementia with behavioral disturbance: Secondary | ICD-10-CM | POA: Diagnosis not present

## 2016-07-15 DIAGNOSIS — R627 Adult failure to thrive: Secondary | ICD-10-CM | POA: Diagnosis not present

## 2016-07-15 DIAGNOSIS — I10 Essential (primary) hypertension: Secondary | ICD-10-CM | POA: Diagnosis not present

## 2016-07-17 DIAGNOSIS — Z9181 History of falling: Secondary | ICD-10-CM | POA: Diagnosis not present

## 2016-07-17 DIAGNOSIS — Z7982 Long term (current) use of aspirin: Secondary | ICD-10-CM | POA: Diagnosis not present

## 2016-07-17 DIAGNOSIS — I10 Essential (primary) hypertension: Secondary | ICD-10-CM | POA: Diagnosis not present

## 2016-07-17 DIAGNOSIS — R627 Adult failure to thrive: Secondary | ICD-10-CM | POA: Diagnosis not present

## 2016-07-17 DIAGNOSIS — R262 Difficulty in walking, not elsewhere classified: Secondary | ICD-10-CM | POA: Diagnosis not present

## 2016-07-17 DIAGNOSIS — F0391 Unspecified dementia with behavioral disturbance: Secondary | ICD-10-CM | POA: Diagnosis not present

## 2016-07-18 DIAGNOSIS — R627 Adult failure to thrive: Secondary | ICD-10-CM | POA: Diagnosis not present

## 2016-07-18 DIAGNOSIS — I10 Essential (primary) hypertension: Secondary | ICD-10-CM | POA: Diagnosis not present

## 2016-07-18 DIAGNOSIS — Z9181 History of falling: Secondary | ICD-10-CM | POA: Diagnosis not present

## 2016-07-18 DIAGNOSIS — R638 Other symptoms and signs concerning food and fluid intake: Secondary | ICD-10-CM | POA: Diagnosis not present

## 2016-07-18 DIAGNOSIS — R262 Difficulty in walking, not elsewhere classified: Secondary | ICD-10-CM | POA: Diagnosis not present

## 2016-07-18 DIAGNOSIS — F0391 Unspecified dementia with behavioral disturbance: Secondary | ICD-10-CM | POA: Diagnosis not present

## 2016-07-18 DIAGNOSIS — S0181XD Laceration without foreign body of other part of head, subsequent encounter: Secondary | ICD-10-CM | POA: Diagnosis not present

## 2016-07-18 DIAGNOSIS — Z7982 Long term (current) use of aspirin: Secondary | ICD-10-CM | POA: Diagnosis not present

## 2016-07-21 DIAGNOSIS — Z9181 History of falling: Secondary | ICD-10-CM | POA: Diagnosis not present

## 2016-07-21 DIAGNOSIS — Z7982 Long term (current) use of aspirin: Secondary | ICD-10-CM | POA: Diagnosis not present

## 2016-07-21 DIAGNOSIS — R627 Adult failure to thrive: Secondary | ICD-10-CM | POA: Diagnosis not present

## 2016-07-21 DIAGNOSIS — F0391 Unspecified dementia with behavioral disturbance: Secondary | ICD-10-CM | POA: Diagnosis not present

## 2016-07-21 DIAGNOSIS — I10 Essential (primary) hypertension: Secondary | ICD-10-CM | POA: Diagnosis not present

## 2016-07-21 DIAGNOSIS — R262 Difficulty in walking, not elsewhere classified: Secondary | ICD-10-CM | POA: Diagnosis not present

## 2016-07-22 DIAGNOSIS — F0391 Unspecified dementia with behavioral disturbance: Secondary | ICD-10-CM | POA: Diagnosis not present

## 2016-07-22 DIAGNOSIS — Z9181 History of falling: Secondary | ICD-10-CM | POA: Diagnosis not present

## 2016-07-22 DIAGNOSIS — R627 Adult failure to thrive: Secondary | ICD-10-CM | POA: Diagnosis not present

## 2016-07-22 DIAGNOSIS — Z7982 Long term (current) use of aspirin: Secondary | ICD-10-CM | POA: Diagnosis not present

## 2016-07-22 DIAGNOSIS — I10 Essential (primary) hypertension: Secondary | ICD-10-CM | POA: Diagnosis not present

## 2016-07-22 DIAGNOSIS — R262 Difficulty in walking, not elsewhere classified: Secondary | ICD-10-CM | POA: Diagnosis not present

## 2016-07-23 DIAGNOSIS — I10 Essential (primary) hypertension: Secondary | ICD-10-CM | POA: Diagnosis not present

## 2016-07-23 DIAGNOSIS — Z9181 History of falling: Secondary | ICD-10-CM | POA: Diagnosis not present

## 2016-07-23 DIAGNOSIS — Z7982 Long term (current) use of aspirin: Secondary | ICD-10-CM | POA: Diagnosis not present

## 2016-07-23 DIAGNOSIS — R262 Difficulty in walking, not elsewhere classified: Secondary | ICD-10-CM | POA: Diagnosis not present

## 2016-07-23 DIAGNOSIS — R627 Adult failure to thrive: Secondary | ICD-10-CM | POA: Diagnosis not present

## 2016-07-23 DIAGNOSIS — F0391 Unspecified dementia with behavioral disturbance: Secondary | ICD-10-CM | POA: Diagnosis not present

## 2016-07-24 DIAGNOSIS — F0391 Unspecified dementia with behavioral disturbance: Secondary | ICD-10-CM | POA: Diagnosis not present

## 2016-07-24 DIAGNOSIS — I10 Essential (primary) hypertension: Secondary | ICD-10-CM | POA: Diagnosis not present

## 2016-07-24 DIAGNOSIS — R627 Adult failure to thrive: Secondary | ICD-10-CM | POA: Diagnosis not present

## 2016-07-24 DIAGNOSIS — F330 Major depressive disorder, recurrent, mild: Secondary | ICD-10-CM | POA: Diagnosis not present

## 2016-07-24 DIAGNOSIS — Z9181 History of falling: Secondary | ICD-10-CM | POA: Diagnosis not present

## 2016-07-24 DIAGNOSIS — Z7982 Long term (current) use of aspirin: Secondary | ICD-10-CM | POA: Diagnosis not present

## 2016-07-24 DIAGNOSIS — R262 Difficulty in walking, not elsewhere classified: Secondary | ICD-10-CM | POA: Diagnosis not present

## 2016-07-24 DIAGNOSIS — F0390 Unspecified dementia without behavioral disturbance: Secondary | ICD-10-CM | POA: Diagnosis not present

## 2016-07-29 DIAGNOSIS — Z9181 History of falling: Secondary | ICD-10-CM | POA: Diagnosis not present

## 2016-07-29 DIAGNOSIS — F0391 Unspecified dementia with behavioral disturbance: Secondary | ICD-10-CM | POA: Diagnosis not present

## 2016-07-29 DIAGNOSIS — R262 Difficulty in walking, not elsewhere classified: Secondary | ICD-10-CM | POA: Diagnosis not present

## 2016-07-29 DIAGNOSIS — R627 Adult failure to thrive: Secondary | ICD-10-CM | POA: Diagnosis not present

## 2016-07-29 DIAGNOSIS — I10 Essential (primary) hypertension: Secondary | ICD-10-CM | POA: Diagnosis not present

## 2016-07-29 DIAGNOSIS — Z7982 Long term (current) use of aspirin: Secondary | ICD-10-CM | POA: Diagnosis not present

## 2016-08-05 DIAGNOSIS — F0391 Unspecified dementia with behavioral disturbance: Secondary | ICD-10-CM | POA: Diagnosis not present

## 2016-08-05 DIAGNOSIS — R627 Adult failure to thrive: Secondary | ICD-10-CM | POA: Diagnosis not present

## 2016-08-05 DIAGNOSIS — Z9181 History of falling: Secondary | ICD-10-CM | POA: Diagnosis not present

## 2016-08-05 DIAGNOSIS — Z7982 Long term (current) use of aspirin: Secondary | ICD-10-CM | POA: Diagnosis not present

## 2016-08-05 DIAGNOSIS — R262 Difficulty in walking, not elsewhere classified: Secondary | ICD-10-CM | POA: Diagnosis not present

## 2016-08-05 DIAGNOSIS — I10 Essential (primary) hypertension: Secondary | ICD-10-CM | POA: Diagnosis not present

## 2016-08-06 DIAGNOSIS — E039 Hypothyroidism, unspecified: Secondary | ICD-10-CM | POA: Diagnosis not present

## 2016-08-06 DIAGNOSIS — E781 Pure hyperglyceridemia: Secondary | ICD-10-CM | POA: Diagnosis not present

## 2016-08-06 DIAGNOSIS — K5901 Slow transit constipation: Secondary | ICD-10-CM | POA: Diagnosis not present

## 2016-08-06 DIAGNOSIS — Z9181 History of falling: Secondary | ICD-10-CM | POA: Diagnosis not present

## 2016-08-06 DIAGNOSIS — I10 Essential (primary) hypertension: Secondary | ICD-10-CM | POA: Diagnosis not present

## 2016-08-06 DIAGNOSIS — F330 Major depressive disorder, recurrent, mild: Secondary | ICD-10-CM | POA: Diagnosis not present

## 2016-08-06 DIAGNOSIS — R2681 Unsteadiness on feet: Secondary | ICD-10-CM | POA: Diagnosis not present

## 2016-08-22 DIAGNOSIS — F330 Major depressive disorder, recurrent, mild: Secondary | ICD-10-CM | POA: Diagnosis not present

## 2016-08-22 DIAGNOSIS — F0390 Unspecified dementia without behavioral disturbance: Secondary | ICD-10-CM | POA: Diagnosis not present

## 2016-08-22 DIAGNOSIS — S0181XA Laceration without foreign body of other part of head, initial encounter: Secondary | ICD-10-CM | POA: Diagnosis not present

## 2016-08-22 DIAGNOSIS — Z7982 Long term (current) use of aspirin: Secondary | ICD-10-CM | POA: Diagnosis not present

## 2016-08-22 DIAGNOSIS — E039 Hypothyroidism, unspecified: Secondary | ICD-10-CM | POA: Diagnosis not present

## 2016-09-03 DIAGNOSIS — I10 Essential (primary) hypertension: Secondary | ICD-10-CM | POA: Diagnosis not present

## 2016-09-03 DIAGNOSIS — R627 Adult failure to thrive: Secondary | ICD-10-CM | POA: Diagnosis not present

## 2016-09-03 DIAGNOSIS — E781 Pure hyperglyceridemia: Secondary | ICD-10-CM | POA: Diagnosis not present

## 2016-09-03 DIAGNOSIS — E039 Hypothyroidism, unspecified: Secondary | ICD-10-CM | POA: Diagnosis not present

## 2016-09-05 DIAGNOSIS — E039 Hypothyroidism, unspecified: Secondary | ICD-10-CM | POA: Diagnosis not present

## 2016-09-08 DIAGNOSIS — F330 Major depressive disorder, recurrent, mild: Secondary | ICD-10-CM | POA: Diagnosis not present

## 2016-09-08 DIAGNOSIS — E781 Pure hyperglyceridemia: Secondary | ICD-10-CM | POA: Diagnosis not present

## 2016-09-08 DIAGNOSIS — Z9181 History of falling: Secondary | ICD-10-CM | POA: Diagnosis not present

## 2016-09-08 DIAGNOSIS — I10 Essential (primary) hypertension: Secondary | ICD-10-CM | POA: Diagnosis not present

## 2016-09-08 DIAGNOSIS — R2681 Unsteadiness on feet: Secondary | ICD-10-CM | POA: Diagnosis not present

## 2016-09-08 DIAGNOSIS — R638 Other symptoms and signs concerning food and fluid intake: Secondary | ICD-10-CM | POA: Diagnosis not present

## 2016-09-08 DIAGNOSIS — K5901 Slow transit constipation: Secondary | ICD-10-CM | POA: Diagnosis not present

## 2016-09-08 DIAGNOSIS — E039 Hypothyroidism, unspecified: Secondary | ICD-10-CM | POA: Diagnosis not present

## 2016-09-18 DIAGNOSIS — F330 Major depressive disorder, recurrent, mild: Secondary | ICD-10-CM | POA: Diagnosis not present

## 2016-09-18 DIAGNOSIS — F0390 Unspecified dementia without behavioral disturbance: Secondary | ICD-10-CM | POA: Diagnosis not present

## 2016-09-19 DIAGNOSIS — Z9181 History of falling: Secondary | ICD-10-CM | POA: Diagnosis not present

## 2016-09-19 DIAGNOSIS — I10 Essential (primary) hypertension: Secondary | ICD-10-CM | POA: Diagnosis not present

## 2016-09-19 DIAGNOSIS — E781 Pure hyperglyceridemia: Secondary | ICD-10-CM | POA: Diagnosis not present

## 2016-09-19 DIAGNOSIS — R2681 Unsteadiness on feet: Secondary | ICD-10-CM | POA: Diagnosis not present

## 2016-10-08 DIAGNOSIS — F330 Major depressive disorder, recurrent, mild: Secondary | ICD-10-CM | POA: Diagnosis not present

## 2016-10-08 DIAGNOSIS — I10 Essential (primary) hypertension: Secondary | ICD-10-CM | POA: Diagnosis not present

## 2016-10-08 DIAGNOSIS — E781 Pure hyperglyceridemia: Secondary | ICD-10-CM | POA: Diagnosis not present

## 2016-10-08 DIAGNOSIS — Z9181 History of falling: Secondary | ICD-10-CM | POA: Diagnosis not present

## 2016-10-08 DIAGNOSIS — E039 Hypothyroidism, unspecified: Secondary | ICD-10-CM | POA: Diagnosis not present

## 2016-10-08 DIAGNOSIS — R2681 Unsteadiness on feet: Secondary | ICD-10-CM | POA: Diagnosis not present

## 2016-10-08 DIAGNOSIS — Z7982 Long term (current) use of aspirin: Secondary | ICD-10-CM | POA: Diagnosis not present

## 2016-10-08 DIAGNOSIS — K5901 Slow transit constipation: Secondary | ICD-10-CM | POA: Diagnosis not present

## 2016-10-08 DIAGNOSIS — R638 Other symptoms and signs concerning food and fluid intake: Secondary | ICD-10-CM | POA: Diagnosis not present

## 2016-10-17 DIAGNOSIS — K5901 Slow transit constipation: Secondary | ICD-10-CM | POA: Diagnosis not present

## 2016-10-17 DIAGNOSIS — Z9181 History of falling: Secondary | ICD-10-CM | POA: Diagnosis not present

## 2016-10-17 DIAGNOSIS — E781 Pure hyperglyceridemia: Secondary | ICD-10-CM | POA: Diagnosis not present

## 2016-10-17 DIAGNOSIS — R2681 Unsteadiness on feet: Secondary | ICD-10-CM | POA: Diagnosis not present

## 2016-10-17 DIAGNOSIS — F330 Major depressive disorder, recurrent, mild: Secondary | ICD-10-CM | POA: Diagnosis not present

## 2016-10-17 DIAGNOSIS — R627 Adult failure to thrive: Secondary | ICD-10-CM | POA: Diagnosis not present

## 2016-10-17 DIAGNOSIS — I10 Essential (primary) hypertension: Secondary | ICD-10-CM | POA: Diagnosis not present

## 2016-10-23 DIAGNOSIS — F330 Major depressive disorder, recurrent, mild: Secondary | ICD-10-CM | POA: Diagnosis not present

## 2016-10-23 DIAGNOSIS — F0390 Unspecified dementia without behavioral disturbance: Secondary | ICD-10-CM | POA: Diagnosis not present

## 2016-10-27 DIAGNOSIS — I739 Peripheral vascular disease, unspecified: Secondary | ICD-10-CM | POA: Diagnosis not present

## 2016-10-27 DIAGNOSIS — B351 Tinea unguium: Secondary | ICD-10-CM | POA: Diagnosis not present

## 2016-10-27 DIAGNOSIS — L603 Nail dystrophy: Secondary | ICD-10-CM | POA: Diagnosis not present

## 2016-10-27 DIAGNOSIS — L853 Xerosis cutis: Secondary | ICD-10-CM | POA: Diagnosis not present

## 2016-10-27 DIAGNOSIS — Q845 Enlarged and hypertrophic nails: Secondary | ICD-10-CM | POA: Diagnosis not present

## 2016-10-27 DIAGNOSIS — M2010 Hallux valgus (acquired), unspecified foot: Secondary | ICD-10-CM | POA: Diagnosis not present

## 2016-11-04 DIAGNOSIS — E781 Pure hyperglyceridemia: Secondary | ICD-10-CM | POA: Diagnosis not present

## 2016-11-04 DIAGNOSIS — K5901 Slow transit constipation: Secondary | ICD-10-CM | POA: Diagnosis not present

## 2016-11-04 DIAGNOSIS — E039 Hypothyroidism, unspecified: Secondary | ICD-10-CM | POA: Diagnosis not present

## 2016-11-04 DIAGNOSIS — R2681 Unsteadiness on feet: Secondary | ICD-10-CM | POA: Diagnosis not present

## 2016-11-04 DIAGNOSIS — F330 Major depressive disorder, recurrent, mild: Secondary | ICD-10-CM | POA: Diagnosis not present

## 2016-11-04 DIAGNOSIS — Z9181 History of falling: Secondary | ICD-10-CM | POA: Diagnosis not present

## 2016-11-04 DIAGNOSIS — Z7982 Long term (current) use of aspirin: Secondary | ICD-10-CM | POA: Diagnosis not present

## 2016-11-04 DIAGNOSIS — R638 Other symptoms and signs concerning food and fluid intake: Secondary | ICD-10-CM | POA: Diagnosis not present

## 2016-11-04 DIAGNOSIS — I10 Essential (primary) hypertension: Secondary | ICD-10-CM | POA: Diagnosis not present

## 2016-11-13 DIAGNOSIS — F0390 Unspecified dementia without behavioral disturbance: Secondary | ICD-10-CM | POA: Diagnosis not present

## 2016-11-13 DIAGNOSIS — F330 Major depressive disorder, recurrent, mild: Secondary | ICD-10-CM | POA: Diagnosis not present

## 2016-11-21 DIAGNOSIS — I10 Essential (primary) hypertension: Secondary | ICD-10-CM | POA: Diagnosis not present

## 2016-11-21 DIAGNOSIS — R627 Adult failure to thrive: Secondary | ICD-10-CM | POA: Diagnosis not present

## 2016-11-21 DIAGNOSIS — E781 Pure hyperglyceridemia: Secondary | ICD-10-CM | POA: Diagnosis not present

## 2016-11-21 DIAGNOSIS — Z9181 History of falling: Secondary | ICD-10-CM | POA: Diagnosis not present

## 2016-11-21 DIAGNOSIS — K5901 Slow transit constipation: Secondary | ICD-10-CM | POA: Diagnosis not present

## 2016-11-21 DIAGNOSIS — R2681 Unsteadiness on feet: Secondary | ICD-10-CM | POA: Diagnosis not present

## 2016-11-21 DIAGNOSIS — F330 Major depressive disorder, recurrent, mild: Secondary | ICD-10-CM | POA: Diagnosis not present

## 2016-11-23 ENCOUNTER — Emergency Department (HOSPITAL_COMMUNITY)
Admission: EM | Admit: 2016-11-23 | Discharge: 2016-11-24 | Disposition: A | Discharge status: Skilled Nursing Facility | Payer: Medicare Other | Attending: Emergency Medicine | Admitting: Emergency Medicine

## 2016-11-23 ENCOUNTER — Encounter (HOSPITAL_COMMUNITY): Payer: Self-pay | Admitting: Emergency Medicine

## 2016-11-23 ENCOUNTER — Emergency Department (HOSPITAL_COMMUNITY): Payer: Medicare Other

## 2016-11-23 DIAGNOSIS — W19XXXA Unspecified fall, initial encounter: Secondary | ICD-10-CM | POA: Insufficient documentation

## 2016-11-23 DIAGNOSIS — S0990XA Unspecified injury of head, initial encounter: Secondary | ICD-10-CM

## 2016-11-23 DIAGNOSIS — Y92121 Bathroom in nursing home as the place of occurrence of the external cause: Secondary | ICD-10-CM | POA: Diagnosis not present

## 2016-11-23 DIAGNOSIS — F0390 Unspecified dementia without behavioral disturbance: Secondary | ICD-10-CM | POA: Diagnosis not present

## 2016-11-23 DIAGNOSIS — Y999 Unspecified external cause status: Secondary | ICD-10-CM | POA: Diagnosis not present

## 2016-11-23 DIAGNOSIS — S199XXA Unspecified injury of neck, initial encounter: Secondary | ICD-10-CM | POA: Diagnosis not present

## 2016-11-23 DIAGNOSIS — S098XXA Other specified injuries of head, initial encounter: Secondary | ICD-10-CM | POA: Diagnosis not present

## 2016-11-23 DIAGNOSIS — Z79899 Other long term (current) drug therapy: Secondary | ICD-10-CM | POA: Diagnosis not present

## 2016-11-23 DIAGNOSIS — E039 Hypothyroidism, unspecified: Secondary | ICD-10-CM | POA: Insufficient documentation

## 2016-11-23 DIAGNOSIS — Y939 Activity, unspecified: Secondary | ICD-10-CM | POA: Diagnosis not present

## 2016-11-23 DIAGNOSIS — S0101XA Laceration without foreign body of scalp, initial encounter: Secondary | ICD-10-CM | POA: Diagnosis not present

## 2016-11-23 DIAGNOSIS — Z87891 Personal history of nicotine dependence: Secondary | ICD-10-CM | POA: Insufficient documentation

## 2016-11-23 DIAGNOSIS — I10 Essential (primary) hypertension: Secondary | ICD-10-CM | POA: Insufficient documentation

## 2016-11-23 DIAGNOSIS — S0181XA Laceration without foreign body of other part of head, initial encounter: Secondary | ICD-10-CM | POA: Diagnosis not present

## 2016-11-23 NOTE — ED Notes (Signed)
Patient transported to CT 

## 2016-11-23 NOTE — ED Triage Notes (Signed)
Per EMS , pt. from [facility] , reported of unwitnessed fall around 08:30 this evening , reported of head lac about an inch, bleeding controlled, not on blood thinner. Pt. Has dementia, denied LOC upon fall. C-collar in placed.

## 2016-11-24 ENCOUNTER — Emergency Department (HOSPITAL_COMMUNITY): Payer: Medicare Other

## 2016-11-24 DIAGNOSIS — S0191XA Laceration without foreign body of unspecified part of head, initial encounter: Secondary | ICD-10-CM | POA: Diagnosis not present

## 2016-11-24 DIAGNOSIS — R4182 Altered mental status, unspecified: Secondary | ICD-10-CM | POA: Diagnosis not present

## 2016-11-24 MED ORDER — LIDOCAINE-EPINEPHRINE-TETRACAINE (LET) SOLUTION
3.0000 mL | Freq: Once | NASAL | Status: AC
  Administered 2016-11-24: 3 mL via TOPICAL
  Filled 2016-11-24: qty 3

## 2016-11-24 NOTE — ED Notes (Addendum)
Xray tech report she is not able to do rib xray d/t pt not staying still.  Pt had gotten up 3 times and is not following instructions.  Joselyn Glassman EDPA made aware

## 2016-11-24 NOTE — ED Notes (Signed)
dsg placed on pt's head lac after staples placed by Joselyn Glassman EDPA.

## 2016-11-24 NOTE — Discharge Instructions (Signed)
CT shows no signs of head bleed. His neck is okay. Does show a fracture of the tip of his thoracic vertebrae. However patient is not tender over this area. May be from previous fall. Patient will need follow-up with orthopedics. Please make sure he follows up with his primary care doctor as well.  WOUND CARE Please have your stitches/staples removed in 5-7 or sooner if you have concerns. You may do this at any available urgent care or at your primary care doctor's office.  Keep area clean and dry for 24 hours. Do not remove bandage, if applied.  After 24 hours, remove bandage and wash wound gently with mild soap and warm water. Reapply a new bandage after cleaning wound, if directed.  Continue daily cleansing with soap and water until stitches/staples are removed.  Do not apply any ointments or creams to the wound while stitches/staples are in place, as this may cause delayed healing.  Seek medical careif you experience any of the following signs of infection: Swelling, redness, pus drainage, streaking, fever >101.0 F  Seek care if you experience excessive bleeding that does not stop after 15-20 minutes of constant, firm pressure.

## 2016-11-24 NOTE — ED Provider Notes (Signed)
[facility]-EMERGENCY DEPT Provider Note   CSN: [id number] Arrival date & time: 11/23/16  2221     History   Chief Complaint Chief Complaint  Patient presents with  . Fall  . Head Laceration    HPI Benjamin Banks is a 83 y.o. male.  HPI 83 year old male history of recurrent falls, dementia presents from nursing home by EMS for evaluation of head laceration following a mechanical fall. This was an unwitnessed fall today. Patient with history of recurrent falls. Spoke with the nursing facility who states that patient is unsteady on his feet at baseline. They state that he is at his neurological baseline. The patient is alert and oriented 3. States that he lost his footing today and fell striking the right side of his head on the toilet. Patient states he was able to get himself up and has been ambulatory since the event. Patient was placed in c-collar by EMS. Patient is not on blood thinners. Patient denies any pain at this time.  Level 5 caveat for dementia. Past Medical History:  Diagnosis Date  . Anemia   . Benign positional vertigo   . Bilateral carpal tunnel syndrome   . BPH (benign prostatic hyperplasia)   . Chest pain    noncardiac  . Chronic anal fissure   . Chronic low back pain   . Dementia   . Disease    perrone  . Diverticulosis   . DJD (degenerative joint disease)   . Elevated blood sugar   . GERD (gastroesophageal reflux disease)   . Gilbert's syndrome   . H/O thyroidectomy   . Heart murmur   . Hypercholesteremia   . Hypertension    labils  . Hyperthyroidism   . Iron deficiency   . Lichen planus   . Peripheral neuropathy   . Polymyalgia rheumatica [facility])     Patient Active Problem List   Diagnosis Date Noted  . AKI (acute kidney injury) (HCC) 04/29/2016  . Dementia 04/29/2016  . Cellulitis of right upper extremity 04/29/2016  . Failure to thrive in adult 04/29/2016  . Depression 04/29/2016  . Hypothyroidism 04/29/2016  . Head injury   . Fall  04/28/2016  . Exertional chest pain 01/11/2014  . Angina decubitus (HCC) 01/11/2014  . Hyperlipidemia 01/11/2014  . Essential hypertension 01/11/2014    Past Surgical History:  Procedure Laterality Date  . APPENDECTOMY    . bottom teeth removed with implants    . hemrrhoidectomy    . left and right cataract    . left lens implant    . THYROIDECTOMY, PARTIAL    . TONSILLECTOMY         Home Medications    Prior to Admission medications   Medication Sig Start Date End Date Taking? Authorizing Provider  Amino Acids-Protein Hydrolys (FEEDING SUPPLEMENT, PRO-STAT SUGAR FREE 64,) LIQD Take 30 mLs by mouth 2 (two) times daily.    [provider]  aspirin 81 MG tablet Take 81 mg by mouth daily.    [provider]  atorvastatin (LIPITOR) 10 MG tablet Take 10 mg by mouth daily.    [provider]  docusate sodium (COLACE) 100 MG capsule Take 2 capsules (200 mg total) by mouth 2 (two) times daily. Patient taking differently: Take 200 mg by mouth every morning.  05/03/16   Leroy Sea, MD  doxycycline (VIBRA-TABS) 100 MG tablet Take 1 tablet (100 mg total) by mouth every 12 (twelve) hours. 5 days Patient not taking: Reported on 07/06/2016 05/03/16  Leroy Sea, MD  hydrochlorothiazide (MICROZIDE) 12.5 MG capsule Take 12.5 mg by mouth daily.    [provider]  Multiple Vitamins-Minerals (CERTAGEN PO) Take 1 tablet by mouth daily.    [provider]  sertraline (ZOLOFT) 25 MG tablet Take 25 mg by mouth daily.    [provider]  SYNTHROID 50 MCG tablet Take 50 mcg by mouth daily before breakfast.  12/26/13   [provider]    Family History Family History  Problem Relation Age of Onset  . COPD Mother   . COPD Father   . Alcohol abuse Father     Social History Social History  Substance Use Topics  . Smoking status: Former Games developer  . Smokeless tobacco: Never Used  . Alcohol use Yes     Comment: RARE      Allergies   Ativan [lorazepam]   Review of Systems Review of Systems  Unable to perform ROS: Dementia     Physical Exam Updated Vital Signs BP (!) 159/80 (BP Location: Right Arm)   Pulse (!) 54   Temp 98.4 F (36.9 C) (Oral)   Resp 18   SpO2 100%   Physical Exam Physical Exam  Constitutional: Pt is oriented to person, place, and time. Appears well-developed and well-nourished. No distress.  HENT:  Head: Normocephalic and atraumatic. approx 1 cm laceration to the right temporal region. Bleeding controlled.  Ears: No bilateral hemotympanum. Nose: Nose normal. No septal hematoma. Mouth/Throat: Uvula is midline, oropharynx is clear and moist and mucous membranes are normal.  Eyes: Conjunctivae and EOM are normal. Pupils are equal, round, and reactive to light.  Neck: No spinous process tenderness and no muscular tenderness present. No rigidity. Normal range of motion present.  Full ROM without pain No midline cervical tenderness No crepitus, deformity or step-offs  No paraspinal tenderness  Cardiovascular: Normal rate, regular rhythm and intact distal pulses.   Pulses:      Radial pulses are 2+ on the right side, and 2+ on the left side.       Dorsalis pedis pulses are 2+ on the right side, and 2+ on the left side.       Posterior tibial pulses are 2+ on the right side, and 2+ on the left side.  Pulmonary/Chest: Effort normal and breath sounds normal. No accessory muscle usage. No respiratory distress. No decreased breath sounds. No wheezes. No rhonchi. No rales. Exhibits no tenderness and no bony tenderness.  No flail segment, crepitus or deformity Equal chest expansion  Abdominal: Soft. Normal appearance and bowel sounds are normal. There is no tenderness. There is no rigidity, no guarding and no CVA tenderness.  Abd soft and nontender  Musculoskeletal: Normal range of motion.       Thoracic back: Exhibits normal range of motion.       Lumbar back: Exhibits normal  range of motion.  Full range of motion of the T-spine and L-spine No tenderness to palpation of the spinous processes of the T-spine or L-spine No crepitus, deformity or step-offs No tenderness to palpation of the paraspinous muscles of the L-spine  Pelvis is stable Head to toe evaluation shows no hematoma, bleeding of the scalp, no facial abrasions, no spine step offs, crepitus of the chest or neck, no tenderness to palpation of the bilateral upper and lower extremities, no gross deformities, no chest tenderness, no pelvic pain. Lymphadenopathy:    Pt has no cervical adenopathy.  Neurological: Pt is alert and oriented to person,  place, and time. Normal reflexes. No cranial nerve deficit. GCS eye subscore is 4. GCS verbal subscore is 5. GCS motor subscore is 6.  Reflex Scores:      Bicep reflexes are 2+ on the right side and 2+ on the left side.      Brachioradialis reflexes are 2+ on the right side and 2+ on the left side.      Patellar reflexes are 2+ on the right side and 2+ on the left side.      Achilles reflexes are 2+ on the right side and 2+ on the left side. Speech is clear and goal oriented, follows commands Normal 5/5 strength in upper and lower extremities bilaterally including dorsiflexion and plantar flexion, strong and equal grip strength Sensation normal to light and sharp touch Moves extremities without ataxia, coordination intact Normal gait and balance No Clonus  Skin: Skin is warm and dry. No rash noted. Pt is not diaphoretic. No erythema.  Psychiatric: Normal mood and affect.  Nursing note and vitals reviewed.     ED Treatments / Results  Labs (all labs ordered are listed, but only abnormal results are displayed) Labs Reviewed - No data to display  EKG  EKG Interpretation None       Radiology Ct Head Wo Contrast  Result Date: 11/24/2016 CLINICAL DATA:  Unwitnessed fall. History of dementia. Laceration to the head. Cervical collar. EXAM: CT HEAD WITHOUT  CONTRAST CT CERVICAL SPINE WITHOUT CONTRAST TECHNIQUE: Multidetector CT imaging of the head and cervical spine was performed following the standard protocol without intravenous contrast. Multiplanar CT image reconstructions of the cervical spine were also generated. COMPARISON:  CT head 07/06/2016. CT head and cervical spine 04/28/2016. FINDINGS: CT HEAD FINDINGS Brain: Diffuse cerebral atrophy. Ventricular dilatation likely due to central atrophy. Patchy low-attenuation changes in the deep white matter likely due to small vessel ischemia. No mass-effect or midline shift. No abnormal extra-axial fluid collections. Gray-white matter junctions are distinct. Basal cisterns are not effaced. No acute intracranial hemorrhage. Vascular: Internal carotid artery and vertebrobasilar arterial calcifications present. Skull: Calvarium appears intact. Sinuses/Orbits: Paranasal sinuses and mastoid air cells are not opacified. Other: Old nasal bone deformities. CT CERVICAL SPINE FINDINGS Alignment: There is about 3 mm anterior subluxation at C6-7, C7-T1, and T1-T2 levels. This appearance is similar to previous study and likely represents degenerative change. Normal alignment of the facet joints. C1-2 articulation appears intact. Skull base and vertebrae: Skullbase appears intact. No vertebral compression deformities. No focal bone lesion or bone destruction. Mildly displaced fractures are demonstrated along the spinous processes at T2 and T3. These are new since the previous study. No other acute fractures are demonstrated. Soft tissues and spinal canal: No prevertebral soft tissue swelling. No paraspinal soft tissue mass or infiltration. Disc levels: Degenerative changes throughout the cervical spine with narrowed interspaces and endplate hypertrophic changes. Degenerative changes are prominent at C4-5, C5-6, and C6-7 levels as well as T1-2 and T2-3 levels. Degenerative changes throughout the cervical facet joints. Upper chest:  Lung apices are clear.  Vascular calcifications. Other: None. IMPRESSION: 1. No acute intracranial abnormalities. Chronic atrophy and small vessel ischemic changes. 2. Intracranial and cervical vascular calcifications. 3. Anterior subluxations in the cervical spine at multiple levels. Alignment is unchanged since previous study and is likely due to degenerative change. 4. Degenerative changes throughout the cervical spine. 5. Mildly displaced acute fractures of the spinous processes of T2 and T3. Electronically Signed   By: Burman Nieves M.D.   On: 11/24/2016  00:04   Ct Cervical Spine Wo Contrast  Result Date: 11/24/2016 CLINICAL DATA:  Unwitnessed fall. History of dementia. Laceration to the head. Cervical collar. EXAM: CT HEAD WITHOUT CONTRAST CT CERVICAL SPINE WITHOUT CONTRAST TECHNIQUE: Multidetector CT imaging of the head and cervical spine was performed following the standard protocol without intravenous contrast. Multiplanar CT image reconstructions of the cervical spine were also generated. COMPARISON:  CT head 07/06/2016. CT head and cervical spine 04/28/2016. FINDINGS: CT HEAD FINDINGS Brain: Diffuse cerebral atrophy. Ventricular dilatation likely due to central atrophy. Patchy low-attenuation changes in the deep white matter likely due to small vessel ischemia. No mass-effect or midline shift. No abnormal extra-axial fluid collections. Gray-white matter junctions are distinct. Basal cisterns are not effaced. No acute intracranial hemorrhage. Vascular: Internal carotid artery and vertebrobasilar arterial calcifications present. Skull: Calvarium appears intact. Sinuses/Orbits: Paranasal sinuses and mastoid air cells are not opacified. Other: Old nasal bone deformities. CT CERVICAL SPINE FINDINGS Alignment: There is about 3 mm anterior subluxation at C6-7, C7-T1, and T1-T2 levels. This appearance is similar to previous study and likely represents degenerative change. Normal alignment of the facet  joints. C1-2 articulation appears intact. Skull base and vertebrae: Skullbase appears intact. No vertebral compression deformities. No focal bone lesion or bone destruction. Mildly displaced fractures are demonstrated along the spinous processes at T2 and T3. These are new since the previous study. No other acute fractures are demonstrated. Soft tissues and spinal canal: No prevertebral soft tissue swelling. No paraspinal soft tissue mass or infiltration. Disc levels: Degenerative changes throughout the cervical spine with narrowed interspaces and endplate hypertrophic changes. Degenerative changes are prominent at C4-5, C5-6, and C6-7 levels as well as T1-2 and T2-3 levels. Degenerative changes throughout the cervical facet joints. Upper chest: Lung apices are clear.  Vascular calcifications. Other: None. IMPRESSION: 1. No acute intracranial abnormalities. Chronic atrophy and small vessel ischemic changes. 2. Intracranial and cervical vascular calcifications. 3. Anterior subluxations in the cervical spine at multiple levels. Alignment is unchanged since previous study and is likely due to degenerative change. 4. Degenerative changes throughout the cervical spine. 5. Mildly displaced acute fractures of the spinous processes of T2 and T3. Electronically Signed   By: Burman Nieves M.D.   On: 11/24/2016 00:04    Procedures .Marland KitchenLaceration Repair Date/Time: 11/24/2016 2:34 AM Performed by: Rise Mu Authorized by: Demetrios Loll T   Consent:    Consent obtained:  Verbal   Consent given by:  Patient   Risks discussed:  Infection, need for additional repair, poor wound healing, poor cosmetic result, pain, nerve damage, retained foreign body, tendon damage and vascular damage   Alternatives discussed:  No treatment Anesthesia (see MAR for exact dosages):    Anesthesia method:  Topical application   Topical anesthetic:  LET Laceration details:    Location:  Scalp   Scalp location:  R  temporal   Length (cm):  3   Depth (mm):  1 Repair type:    Repair type:  Simple Pre-procedure details:    Preparation:  Patient was prepped and draped in usual sterile fashion and imaging obtained to evaluate for foreign bodies Exploration:    Hemostasis achieved with:  Direct pressure   Wound extent: no foreign bodies/material noted and no underlying fracture noted     Contaminated: no   Treatment:    Area cleansed with:  Betadine and saline   Amount of cleaning:  Standard   Irrigation solution:  Sterile saline   Irrigation volume:  50  Irrigation method:  Pressure wash   Visualized foreign bodies/material removed: no   Skin repair:    Repair method:  Staples   Number of staples:  3 Approximation:    Approximation:  Close   Vermilion border: well-aligned   Post-procedure details:    Dressing:  Non-adherent dressing   Patient tolerance of procedure:  Tolerated well, no immediate complications   (including critical care time)  Medications Ordered in ED Medications  lidocaine-EPINEPHrine-tetracaine (LET) solution (3 mLs Topical Given 11/24/16 0144)     Initial Impression / Assessment and Plan / ED Course  I have reviewed the triage vital signs and the nursing notes.  Pertinent labs & imaging results that were available during my care of the patient were reviewed by me and considered in my medical decision making (see chart for details).     Pt presents to the Ed for unwitnessed fall. History of same. Patient is alert and oriented 3. He does have a laceration that was repaired. States that his tetanus is up-to-date. CT head was ordered that was unremarkable. Cervical spine shows chronic changes but no acute changes. Does show a T3-T4 spinous process fracture. Patient has no midline thoracic or lumbar tenderness to palpation. No obvious bruising here. Not sure if this is from this fall or prior fall 6 months ago. Patient is ambulatory in the ED without any pain. Doubt pelvis  fracture. Did order additional imaging of the thoracic spine however patient would not sit still for imaging. Do not feel this is needed at this time given full range of motion, and imitation with normal gait and neurovascularly intact. Patient will need close follow-up PCP and orthopedics.  Pt is hemodynamically stable, in NAD, & able to ambulate in the ED. Evaluation does not show pathology that would require ongoing emergent intervention or inpatient treatment. I explained the diagnosis to the patient. Pain has been managed & has no complaints prior to dc. Pt is comfortable with above plan and is stable for discharge at this time. All questions were answered prior to disposition. Strict return precautions for f/u to the ED were discussed. Encouraged follow up with PCP.  Pt was seen and eval by Dr. Judd Lien who is agreeable to the above plan.   Final Clinical Impressions(s) / ED Diagnoses   Final diagnoses:  Injury of head, initial encounter  Fall, initial encounter    New Prescriptions New Prescriptions   No medications on file     Wallace Keller 11/24/16 1610    Geoffery Lyons, MD 11/24/16 [phone]

## 2016-11-24 NOTE — ED Notes (Signed)
dsg on head is off.

## 2016-12-05 DIAGNOSIS — E781 Pure hyperglyceridemia: Secondary | ICD-10-CM | POA: Diagnosis not present

## 2016-12-05 DIAGNOSIS — Z9181 History of falling: Secondary | ICD-10-CM | POA: Diagnosis not present

## 2016-12-05 DIAGNOSIS — K5901 Slow transit constipation: Secondary | ICD-10-CM | POA: Diagnosis not present

## 2016-12-05 DIAGNOSIS — E039 Hypothyroidism, unspecified: Secondary | ICD-10-CM | POA: Diagnosis not present

## 2016-12-05 DIAGNOSIS — Z7982 Long term (current) use of aspirin: Secondary | ICD-10-CM | POA: Diagnosis not present

## 2016-12-05 DIAGNOSIS — I10 Essential (primary) hypertension: Secondary | ICD-10-CM | POA: Diagnosis not present

## 2016-12-05 DIAGNOSIS — F330 Major depressive disorder, recurrent, mild: Secondary | ICD-10-CM | POA: Diagnosis not present

## 2016-12-05 DIAGNOSIS — R2681 Unsteadiness on feet: Secondary | ICD-10-CM | POA: Diagnosis not present

## 2016-12-05 DIAGNOSIS — R638 Other symptoms and signs concerning food and fluid intake: Secondary | ICD-10-CM | POA: Diagnosis not present

## 2016-12-07 DIAGNOSIS — Z23 Encounter for immunization: Secondary | ICD-10-CM | POA: Diagnosis not present

## 2016-12-15 DIAGNOSIS — F0390 Unspecified dementia without behavioral disturbance: Secondary | ICD-10-CM | POA: Diagnosis not present

## 2016-12-15 DIAGNOSIS — F330 Major depressive disorder, recurrent, mild: Secondary | ICD-10-CM | POA: Diagnosis not present

## 2016-12-19 DIAGNOSIS — F330 Major depressive disorder, recurrent, mild: Secondary | ICD-10-CM | POA: Diagnosis not present

## 2016-12-19 DIAGNOSIS — R627 Adult failure to thrive: Secondary | ICD-10-CM | POA: Diagnosis not present

## 2016-12-19 DIAGNOSIS — Z7982 Long term (current) use of aspirin: Secondary | ICD-10-CM | POA: Diagnosis not present

## 2016-12-19 DIAGNOSIS — K5901 Slow transit constipation: Secondary | ICD-10-CM | POA: Diagnosis not present

## 2016-12-19 DIAGNOSIS — I10 Essential (primary) hypertension: Secondary | ICD-10-CM | POA: Diagnosis not present

## 2016-12-19 DIAGNOSIS — R2681 Unsteadiness on feet: Secondary | ICD-10-CM | POA: Diagnosis not present

## 2016-12-19 DIAGNOSIS — E039 Hypothyroidism, unspecified: Secondary | ICD-10-CM | POA: Diagnosis not present

## 2016-12-19 DIAGNOSIS — Z9181 History of falling: Secondary | ICD-10-CM | POA: Diagnosis not present

## 2016-12-19 DIAGNOSIS — E781 Pure hyperglyceridemia: Secondary | ICD-10-CM | POA: Diagnosis not present

## 2016-12-24 DIAGNOSIS — Z7982 Long term (current) use of aspirin: Secondary | ICD-10-CM | POA: Diagnosis not present

## 2016-12-24 DIAGNOSIS — I10 Essential (primary) hypertension: Secondary | ICD-10-CM | POA: Diagnosis not present

## 2016-12-24 DIAGNOSIS — E039 Hypothyroidism, unspecified: Secondary | ICD-10-CM | POA: Diagnosis not present

## 2016-12-24 DIAGNOSIS — R627 Adult failure to thrive: Secondary | ICD-10-CM | POA: Diagnosis not present

## 2016-12-24 DIAGNOSIS — E781 Pure hyperglyceridemia: Secondary | ICD-10-CM | POA: Diagnosis not present

## 2016-12-29 ENCOUNTER — Inpatient Hospital Stay (HOSPITAL_COMMUNITY)
Admission: EM | Admit: 2016-12-29 | Discharge: 2017-01-09 | DRG: 470 | Disposition: A | Discharge status: Skilled Nursing Facility | Payer: Medicare Other | Attending: Internal Medicine | Admitting: Internal Medicine

## 2016-12-29 ENCOUNTER — Emergency Department (HOSPITAL_COMMUNITY): Payer: Medicare Other

## 2016-12-29 ENCOUNTER — Encounter (HOSPITAL_COMMUNITY): Payer: Self-pay

## 2016-12-29 DIAGNOSIS — Z87891 Personal history of nicotine dependence: Secondary | ICD-10-CM

## 2016-12-29 DIAGNOSIS — D72819 Decreased white blood cell count, unspecified: Secondary | ICD-10-CM | POA: Diagnosis present

## 2016-12-29 DIAGNOSIS — E440 Moderate protein-calorie malnutrition: Secondary | ICD-10-CM | POA: Diagnosis present

## 2016-12-29 DIAGNOSIS — E785 Hyperlipidemia, unspecified: Secondary | ICD-10-CM | POA: Diagnosis present

## 2016-12-29 DIAGNOSIS — F05 Delirium due to known physiological condition: Secondary | ICD-10-CM | POA: Diagnosis not present

## 2016-12-29 DIAGNOSIS — D509 Iron deficiency anemia, unspecified: Secondary | ICD-10-CM | POA: Diagnosis present

## 2016-12-29 DIAGNOSIS — I517 Cardiomegaly: Secondary | ICD-10-CM | POA: Diagnosis not present

## 2016-12-29 DIAGNOSIS — N390 Urinary tract infection, site not specified: Secondary | ICD-10-CM | POA: Diagnosis present

## 2016-12-29 DIAGNOSIS — I10 Essential (primary) hypertension: Secondary | ICD-10-CM | POA: Diagnosis present

## 2016-12-29 DIAGNOSIS — D62 Acute posthemorrhagic anemia: Secondary | ICD-10-CM | POA: Diagnosis not present

## 2016-12-29 DIAGNOSIS — D751 Secondary polycythemia: Secondary | ICD-10-CM | POA: Diagnosis present

## 2016-12-29 DIAGNOSIS — S72012A Unspecified intracapsular fracture of left femur, initial encounter for closed fracture: Principal | ICD-10-CM | POA: Diagnosis present

## 2016-12-29 DIAGNOSIS — Z79890 Hormone replacement therapy (postmenopausal): Secondary | ICD-10-CM

## 2016-12-29 DIAGNOSIS — R4182 Altered mental status, unspecified: Secondary | ICD-10-CM | POA: Diagnosis not present

## 2016-12-29 DIAGNOSIS — Z681 Body mass index (BMI) 19 or less, adult: Secondary | ICD-10-CM | POA: Diagnosis not present

## 2016-12-29 DIAGNOSIS — S72002A Fracture of unspecified part of neck of left femur, initial encounter for closed fracture: Secondary | ICD-10-CM | POA: Diagnosis present

## 2016-12-29 DIAGNOSIS — Z7982 Long term (current) use of aspirin: Secondary | ICD-10-CM

## 2016-12-29 DIAGNOSIS — S79919A Unspecified injury of unspecified hip, initial encounter: Secondary | ICD-10-CM | POA: Diagnosis not present

## 2016-12-29 DIAGNOSIS — M25552 Pain in left hip: Secondary | ICD-10-CM | POA: Diagnosis not present

## 2016-12-29 DIAGNOSIS — F0390 Unspecified dementia without behavioral disturbance: Secondary | ICD-10-CM | POA: Diagnosis present

## 2016-12-29 DIAGNOSIS — R296 Repeated falls: Secondary | ICD-10-CM | POA: Diagnosis present

## 2016-12-29 DIAGNOSIS — D696 Thrombocytopenia, unspecified: Secondary | ICD-10-CM | POA: Diagnosis present

## 2016-12-29 DIAGNOSIS — W1830XA Fall on same level, unspecified, initial encounter: Secondary | ICD-10-CM | POA: Diagnosis present

## 2016-12-29 DIAGNOSIS — F32A Depression, unspecified: Secondary | ICD-10-CM | POA: Diagnosis present

## 2016-12-29 DIAGNOSIS — Z888 Allergy status to other drugs, medicaments and biological substances status: Secondary | ICD-10-CM

## 2016-12-29 DIAGNOSIS — W19XXXA Unspecified fall, initial encounter: Secondary | ICD-10-CM | POA: Diagnosis not present

## 2016-12-29 DIAGNOSIS — B9620 Unspecified Escherichia coli [E. coli] as the cause of diseases classified elsewhere: Secondary | ICD-10-CM | POA: Diagnosis present

## 2016-12-29 DIAGNOSIS — F329 Major depressive disorder, single episode, unspecified: Secondary | ICD-10-CM | POA: Diagnosis present

## 2016-12-29 DIAGNOSIS — E039 Hypothyroidism, unspecified: Secondary | ICD-10-CM | POA: Diagnosis present

## 2016-12-29 DIAGNOSIS — R9431 Abnormal electrocardiogram [ECG] [EKG]: Secondary | ICD-10-CM | POA: Diagnosis not present

## 2016-12-29 DIAGNOSIS — R0902 Hypoxemia: Secondary | ICD-10-CM | POA: Diagnosis present

## 2016-12-29 DIAGNOSIS — Z66 Do not resuscitate: Secondary | ICD-10-CM | POA: Diagnosis present

## 2016-12-29 DIAGNOSIS — Z419 Encounter for procedure for purposes other than remedying health state, unspecified: Secondary | ICD-10-CM

## 2016-12-29 DIAGNOSIS — E890 Postprocedural hypothyroidism: Secondary | ICD-10-CM | POA: Diagnosis present

## 2016-12-29 DIAGNOSIS — I4891 Unspecified atrial fibrillation: Secondary | ICD-10-CM | POA: Diagnosis not present

## 2016-12-29 DIAGNOSIS — Z781 Physical restraint status: Secondary | ICD-10-CM

## 2016-12-29 DIAGNOSIS — Z79899 Other long term (current) drug therapy: Secondary | ICD-10-CM

## 2016-12-29 DIAGNOSIS — S72042A Displaced fracture of base of neck of left femur, initial encounter for closed fracture: Secondary | ICD-10-CM | POA: Diagnosis not present

## 2016-12-29 DIAGNOSIS — E876 Hypokalemia: Secondary | ICD-10-CM | POA: Diagnosis present

## 2016-12-29 LAB — BASIC METABOLIC PANEL
Anion gap: 9 (ref 5–15)
BUN: 34 mg/dL — AB (ref 6–20)
CHLORIDE: 103 mmol/L (ref 101–111)
CO2: 27 mmol/L (ref 22–32)
Calcium: 9.2 mg/dL (ref 8.9–10.3)
Creatinine, Ser: 1.05 mg/dL (ref 0.61–1.24)
GFR calc Af Amer: >60 mL/min (ref >60)
GFR calc non Af Amer: >60 mL/min (ref >60)
GLUCOSE: 111 mg/dL — AB (ref 65–99)
POTASSIUM: 3.4 mmol/L — AB (ref 3.5–5.1)
Sodium: 139 mmol/L (ref 135–145)

## 2016-12-29 LAB — CBC WITH DIFFERENTIAL/PLATELET
Basophils Absolute: 0 10*3/uL (ref 0.0–0.1)
Basophils Relative: 0 %
EOS PCT: 1 %
Eosinophils Absolute: 0 10*3/uL (ref 0.0–0.7)
HEMATOCRIT: 53.4 % — AB (ref 39.0–52.0)
Hemoglobin: 18.6 g/dL — ABNORMAL HIGH (ref 13.0–17.0)
LYMPHS ABS: 0.2 10*3/uL — AB (ref 0.7–4.0)
LYMPHS PCT: 5 %
MCH: 33 pg (ref 26.0–34.0)
MCHC: 34.8 g/dL (ref 30.0–36.0)
MCV: 94.8 fL (ref 78.0–100.0)
Monocytes Absolute: 0 10*3/uL — ABNORMAL LOW (ref 0.1–1.0)
Monocytes Relative: 1 %
NEUTROS ABS: 3.5 10*3/uL (ref 1.7–7.7)
Neutrophils Relative %: 93 %
PLATELETS: 126 10*3/uL — AB (ref 150–400)
RBC: 5.63 MIL/uL (ref 4.22–5.81)
RDW: 13.6 % (ref 11.5–15.5)
WBC: 3.8 10*3/uL — AB (ref 4.0–10.5)

## 2016-12-29 LAB — PROTIME-INR
INR: 1.16
Prothrombin Time: 14.7 seconds (ref 11.4–15.2)

## 2016-12-29 LAB — ABO/RH: ABO/RH(D): O NEG

## 2016-12-29 MED ORDER — HYDROMORPHONE HCL 1 MG/ML IJ SOLN
0.5000 mg | INTRAMUSCULAR | Status: DC | PRN
  Administered 2016-12-29: 0.5 mg via INTRAVENOUS
  Filled 2016-12-29: qty 1

## 2016-12-29 MED ORDER — SODIUM CHLORIDE 0.9 % IV SOLN
INTRAVENOUS | Status: AC
  Administered 2016-12-30: via INTRAVENOUS
  Filled 2016-12-29 (×2): qty 1000

## 2016-12-29 MED ORDER — ONDANSETRON HCL 4 MG/2ML IJ SOLN
4.0000 mg | Freq: Four times a day (QID) | INTRAMUSCULAR | Status: DC | PRN
  Administered 2016-12-29: 4 mg via INTRAVENOUS
  Filled 2016-12-29: qty 2

## 2016-12-29 NOTE — ED Provider Notes (Signed)
[facility]-EMERGENCY DEPT Provider Note   CSN: [id number] Arrival date & time: 12/29/16  2125     History   Chief Complaint Chief Complaint  Patient presents with  . Hip Pain    HPI Benjamin Banks is a 83 y.o. male.  HPI Patient presents to the emergency room for evaluation of left hip pain.  According to the EMS report patient's bed broke and he fell to the ground.  Patient has been complaining of left hip pain.  Patient himself does not know what exactly happened.  He is not sure why he is here.  He is complaining of hip pain.  He denies any trouble with any headache or chest pain.  No shortness of breath or abdominal pain.  No numbness or weakness Past Medical History:  Diagnosis Date  . Anemia   . Benign positional vertigo   . Bilateral carpal tunnel syndrome   . BPH (benign prostatic hyperplasia)   . Chest pain    noncardiac  . Chronic anal fissure   . Chronic low back pain   . Dementia   . Disease    perrone  . Diverticulosis   . DJD (degenerative joint disease)   . Elevated blood sugar   . GERD (gastroesophageal reflux disease)   . Gilbert's syndrome   . H/O thyroidectomy   . Heart murmur   . Hypercholesteremia   . Hypertension    labils  . Hyperthyroidism   . Iron deficiency   . Lichen planus   . Peripheral neuropathy   . Polymyalgia rheumatica [facility](HCC)     Patient Active Problem List   Diagnosis Date Noted  . Closed left hip fracture, initial encounter (HCC) 12020-09-216  . Leukopenia 12020-09-216  . Thrombocytopenia (HCC) 12020-09-216  . Hypoxia 12020-09-216  . AKI (acute kidney injury) (HCC) 04/29/2016  . Dementia 04/29/2016  . Cellulitis of right upper extremity 04/29/2016  . Failure to thrive in adult 04/29/2016  . Depression 04/29/2016  . Hypothyroidism 04/29/2016  . Head injury   . Fall 04/28/2016  . Exertional chest pain 01/11/2014  . Angina decubitus (HCC) 01/11/2014  . Hyperlipidemia 01/11/2014  . Essential  hypertension 01/11/2014    Past Surgical History:  Procedure Laterality Date  . APPENDECTOMY    . bottom teeth removed with implants    . hemrrhoidectomy    . left and right cataract    . left lens implant    . THYROIDECTOMY, PARTIAL    . TONSILLECTOMY         Home Medications    Prior to Admission medications   Medication Sig Start Date End Date Taking? Authorizing Provider  Amino Acids-Protein Hydrolys (FEEDING SUPPLEMENT, PRO-STAT SUGAR FREE 64,) LIQD Take 30 mLs by mouth 2 (two) times daily.   Yes [provider]  aspirin 81 MG tablet Take 81 mg by mouth daily.   Yes [provider]  atorvastatin (LIPITOR) 10 MG tablet Take 10 mg by mouth daily.   Yes [provider]  busPIRone (BUSPAR) 7.5 MG tablet Take 7.5 mg 2 (two) times daily by mouth.   Yes [provider]  docusate sodium (COLACE) 100 MG capsule Take 2 capsules (200 mg total) by mouth 2 (two) times daily. Patient taking differently: Take 200 mg daily after breakfast by mouth.  05/03/16  Yes Leroy SeaSingh, Prashant K, MD  hydrochlorothiazide (MICROZIDE) 12.5 MG capsule Take 12.5 mg by mouth daily.   Yes [provider]  mirtazapine (REMERON) 15 MG tablet  Take 15 mg at bedtime by mouth.   Yes [provider]  Multiple Vitamins-Minerals (CERTAGEN PO) Take 1 tablet by mouth daily.   Yes [provider]  NON FORMULARY Take 1 Can 3 (three) times daily after meals by mouth. House Shake   Yes [provider]  sertraline (ZOLOFT) 25 MG tablet Take 25 mg by mouth daily.   Yes [provider]  SYNTHROID 50 MCG tablet Take 50 mcg by mouth daily before breakfast.  12/26/13  Yes [provider]    Family History Family History  Problem Relation Age of Onset  . COPD Mother   . COPD Father   . Alcohol abuse Father     Social History Social History   Tobacco Use  . Smoking status: Former Games developer  . Smokeless tobacco: Never Used  Substance Use  Topics  . Alcohol use: Yes    Comment: RARE  . Drug use: No     Allergies   Ativan [lorazepam]   Review of Systems Review of Systems  All other systems reviewed and are negative.    Physical Exam Updated Vital Signs BP (!) 145/81   Pulse 89   Temp 98.4 F (36.9 C) (Oral)   Resp 16   SpO2 (!) 89%   Physical Exam  Constitutional: No distress.  Elderly, frail  HENT:  Head: Normocephalic and atraumatic.  Right Ear: External ear normal.  Left Ear: External ear normal.  Eyes: Conjunctivae are normal. Right eye exhibits no discharge. Left eye exhibits no discharge. No scleral icterus.  Neck: Neck supple. No tracheal deviation present.  Cardiovascular: Normal rate, regular rhythm and intact distal pulses.  Pulmonary/Chest: Effort normal and breath sounds normal. No stridor. No respiratory distress. He has no wheezes. He has no rales.  Abdominal: Soft. Bowel sounds are normal. He exhibits no distension. There is no tenderness. There is no rebound and no guarding.  Musculoskeletal: He exhibits no edema.       Right shoulder: Normal.       Left shoulder: Normal.       Right hip: Normal.       Left hip: He exhibits tenderness and bony tenderness.       Cervical back: Normal.       Thoracic back: Normal.       Lumbar back: Normal.  Neurological: He is alert. He has normal strength. No cranial nerve deficit (no facial droop, extraocular movements intact, no slurred speech) or sensory deficit. He exhibits normal muscle tone. He displays no seizure activity. Coordination normal. GCS eye subscore is 4. GCS verbal subscore is 4. GCS motor subscore is 6.  Answers questions and follows commands, able to move all extremities , wiggles fingers and toes bilaterally  Skin: Skin is warm and dry. No rash noted.  Psychiatric: He has a normal mood and affect.  Nursing note and vitals reviewed.    ED Treatments / Results  Labs (all labs ordered are listed, but only abnormal results are  displayed) Labs Reviewed  BASIC METABOLIC PANEL - Abnormal; Notable for the following components:      Result Value   Potassium 3.4 (*)    Glucose, Bld 111 (*)    BUN 34 (*)    All other components within normal limits  CBC WITH DIFFERENTIAL/PLATELET - Abnormal; Notable for the following components:   WBC 3.8 (*)    Hemoglobin 18.6 (*)    HCT 53.4 (*)    Platelets 126 (*)  Lymphs Abs 0.2 (*)    Monocytes Absolute 0.0 (*)    All other components within normal limits  PROTIME-INR  URINALYSIS, ROUTINE W REFLEX MICROSCOPIC  TYPE AND SCREEN  ABO/RH    EKG  EKG Interpretation  Date/Time:  Monday December 29 2016 22:21:41 EST Ventricular Rate:  76 PR Interval:    QRS Duration: 85 QT Interval:  418 QTC Calculation: 470 R Axis:   65 Text Interpretation:  Accelerated junctional rhythm Left ventricular hypertrophy Borderline T abnormalities, inferior leads No significant change since last tracing Confirmed by Linwood DibblesKnapp, Ikenna Ohms [phone]([id number]) on 108-26-202018 10:31:44 PM       Radiology Dg Chest 1 View  Result Date: 12/30/2016 CLINICAL DATA:  Initial evaluation for preoperative evaluation, left hip fracture. EXAM: CHEST 1 VIEW COMPARISON:  Prior radiograph from 04/28/2016. FINDINGS: Mild cardiomegaly, stable. Mediastinal silhouette within normal limits. Aortic atherosclerosis. Lungs hypoinflated. No focal infiltrates. No pulmonary edema or pleural effusion. No pneumothorax. No acute osseous abnormality. IMPRESSION: 1. Shallow lung inflation with no active cardiopulmonary disease identified. 2. Mild cardiomegaly, stable. Electronically Signed   By: Rise MuBenjamin  McClintock M.D.   On: 12/30/2016 00:10   Dg Hip Unilat With Pelvis 2-3 Views Left  Result Date: 108-26-202018 CLINICAL DATA:  Left hip pain after a fall this evening. EXAM: DG HIP (WITH OR WITHOUT PELVIS) 2-3V LEFT COMPARISON:  None. FINDINGS: Transverse fracture of the left femoral neck with superior subluxation of the distal fracture fragment  resulting in varus angulation. No dislocation. No definite inter trochanteric extension although rotation limits evaluation. Pelvis appears intact. SI joints and symphysis pubis are not displaced. Degenerative changes in the lower lumbar spine. IMPRESSION: Transverse fracture left femoral neck with varus angulation. No definite trochanteric extension although rotation limits evaluation. Electronically Signed   By: Burman NievesWilliam  Stevens M.D.   On: 108-26-202018 22:23    Procedures Procedures (including critical care time)  Medications Ordered in ED Medications  HYDROmorphone (DILAUDID) injection 0.5 mg (0.5 mg Intravenous Given 12/29/16 2229)  ondansetron (ZOFRAN) injection 4 mg (4 mg Intravenous Given 12/29/16 2229)  sodium chloride 0.9 % 1,000 mL with potassium chloride 30 mEq infusion ( Intravenous New Bag/Given 12/30/16 0005)     Initial Impression / Assessment and Plan / ED Course  I have reviewed the triage vital signs and the nursing notes.  Pertinent labs & imaging results that were available during my care of the patient were reviewed by me and considered in my medical decision making (see chart for details).  Clinical Course as of Dec 31 23  Mon Dec 29, 2016  2335 D/w Dr Ranell PatrickNorris.  Will see pt in am.  Keep NPO  [JK]    Clinical Course User Index [JK] Linwood DibblesKnapp, Ilisha Blust, MD    Patient presented to the emergency room for evaluation after a fall at the nursing facility.  X-ray demonstrates a transverse fracture of the left femoral neck.  Patient otherwise appears medically stable.  Plan on admission for further treatment.  Final Clinical Impressions(s) / ED Diagnoses   Final diagnoses:  Closed fracture of left hip, initial encounter [facility](HCC)      Linwood DibblesKnapp, Shuan Statzer, MD 12/30/16 [phone]

## 2016-12-29 NOTE — ED Triage Notes (Signed)
Pt's bed broke and he fell to the ground, he complains of left hip pain

## 2016-12-29 NOTE — ED Notes (Signed)
Placed patient on 2 L [place] due to saturating at 88% while sleeping.

## 2016-12-29 NOTE — H&P (Signed)
History and Physical    Benjamin Banks ZOX:[id number] DOB: January 04, 1926 DOA: 123-Oct-202018  PCP: Merlene LaughterStoneking, Hal, MD   Patient coming from: SNF  Chief Complaint: Left hip pain   HPI: Benjamin Banks is a 83 y.o. male with medical history significant for dementia, hypothyroidism, hypertension, and chronic iron deficiency anemia, now presenting to the emergency department with left hip pain.  Due to the patient's clinical condition with dementia, history is obtained through discussion with ED personnel and patient's HCPOA, and report of EMS.  His medical records were reviewed.  He was reportedly in his usual state when a foot of his bed broke, resulting in a fall and subsequent pain in the left hip. At baseline, he ambulates with a walker and falls frequently.   ED Course: Upon arrival to the ED, patient is found to be afebrile, saturating upper 80s on room air, and with vitals otherwise stable.  EKG features and accelerated junctional rhythm with LVH by voltage criteria.  Chest x-ray is negative for acute cardiopulmonary disease.  Radiographs of the hip demonstrate transverse fracture of the left femoral neck with varus angulation.  Chemistry panel reveals a mild hypokalemia and elevated BUN to creatinine ratio CBC is notable for a slight leukopenia to 3800, mild thrombocytopenia with platelets 126,000, and a hemoglobin of 18.6.  Patient was treated with Dilaudid and Zofran in the ED and type and screen was performed.  Orthopedic surgery was consulted by the ED physician and recommended a medical admission.  The patient remained hemodynamically stable in the ED, has not been in any apparent respiratory distress, though he is requiring 2 L/min of supplemental oxygen to maintain an O2 saturation of 90%, and he will be admitted to the telemetry unit for ongoing evaluation and management left hip fracture.  Review of Systems:  Unable to complete ROS secondary to patient's clinical condition with  dementia.  Past Medical History:  Diagnosis Date  . Anemia   . Benign positional vertigo   . Bilateral carpal tunnel syndrome   . BPH (benign prostatic hyperplasia)   . Chest pain    noncardiac  . Chronic anal fissure   . Chronic low back pain   . Dementia   . Disease    perrone  . Diverticulosis   . DJD (degenerative joint disease)   . Elevated blood sugar   . GERD (gastroesophageal reflux disease)   . Gilbert's syndrome   . H/O thyroidectomy   . Heart murmur   . Hypercholesteremia   . Hypertension    labils  . Hyperthyroidism   . Iron deficiency   . Lichen planus   . Peripheral neuropathy   . Polymyalgia rheumatica (HCC)     Past Surgical History:  Procedure Laterality Date  . APPENDECTOMY    . bottom teeth removed with implants    . hemrrhoidectomy    . left and right cataract    . left lens implant    . THYROIDECTOMY, PARTIAL    . TONSILLECTOMY       reports that he has quit smoking. he has never used smokeless tobacco. He reports that he drinks alcohol. He reports that he does not use drugs.  Allergies  Allergen Reactions  . Ativan [Lorazepam]     Somnolence     Family History  Problem Relation Age of Onset  . COPD Mother   . COPD Father   . Alcohol abuse Father      Prior to Admission medications   Medication  Sig Start Date End Date Taking? Authorizing Provider  Amino Acids-Protein Hydrolys (FEEDING SUPPLEMENT, PRO-STAT SUGAR FREE 64,) LIQD Take 30 mLs by mouth 2 (two) times daily.   Yes [provider]  aspirin 81 MG tablet Take 81 mg by mouth daily.   Yes [provider]  atorvastatin (LIPITOR) 10 MG tablet Take 10 mg by mouth daily.   Yes [provider]  busPIRone (BUSPAR) 7.5 MG tablet Take 7.5 mg 2 (two) times daily by mouth.   Yes [provider]  docusate sodium (COLACE) 100 MG capsule Take 2 capsules (200 mg total) by mouth 2 (two) times daily. Patient taking differently: Take 200 mg daily after  breakfast by mouth.  05/03/16  Yes Leroy SeaSingh, Prashant K, MD  hydrochlorothiazide (MICROZIDE) 12.5 MG capsule Take 12.5 mg by mouth daily.   Yes [provider]  mirtazapine (REMERON) 15 MG tablet Take 15 mg at bedtime by mouth.   Yes [provider]  Multiple Vitamins-Minerals (CERTAGEN PO) Take 1 tablet by mouth daily.   Yes [provider]  NON FORMULARY Take 1 Can 3 (three) times daily after meals by mouth. House Shake   Yes [provider]  sertraline (ZOLOFT) 25 MG tablet Take 25 mg by mouth daily.   Yes [provider]  SYNTHROID 50 MCG tablet Take 50 mcg by mouth daily before breakfast.  12/26/13  Yes [provider]    Physical Exam: There were no vitals filed for this visit.   Constitutional: No apparent respiratory distress, appears uncomfortable and chronically-ill  Eyes: PERTLA, lids and conjunctivae normal ENMT: Mucous membranes are moist. Posterior pharynx clear of any exudate or lesions.   Neck: normal, supple, no masses, no thyromegaly Respiratory: clear to auscultation bilaterally, no wheezing, no crackles. No accessory muscle use.  Cardiovascular: Rate ~80 and irregular. No extremity edema. 2+ pedal pulses. No significant JVD. Abdomen: No distension, no tenderness, no masses palpated. Bowel sounds active.  Musculoskeletal: no clubbing / cyanosis. Left hip exquisitely tender with external rotation; pedal pulses intact and symmetric and he is moving the left foot.  Skin: no significant rashes, lesions, ulcers. Poor turgor. Neurologic: CN 2-12 grossly intact. Sensation intact. Moving all extremities spontaneously.  Psychiatric: Alert, makes eye-contact but will not offer any verbal response.    Labs on Admission: I have personally reviewed following labs and imaging studies  CBC: No results for input(s): WBC, NEUTROABS, HGB, HCT, MCV, PLT in the last 168 hours. Basic Metabolic Panel: No results for input(s): NA, K, CL,  CO2, GLUCOSE, BUN, CREATININE, CALCIUM, MG, PHOS in the last 168 hours. GFR: CrCl cannot be calculated (Patient's most recent lab result is older than the maximum 21 days allowed.). Liver Function Tests: No results for input(s): AST, ALT, ALKPHOS, BILITOT, PROT, ALBUMIN in the last 168 hours. No results for input(s): LIPASE, AMYLASE in the last 168 hours. No results for input(s): AMMONIA in the last 168 hours. Coagulation Profile: Recent Labs  Lab 12/29/16 2243  INR 1.16   Cardiac Enzymes: No results for input(s): CKTOTAL, CKMB, CKMBINDEX, TROPONINI in the last 168 hours. BNP (last 3 results) No results for input(s): PROBNP in the last 8760 hours. HbA1C: No results for input(s): HGBA1C in the last 72 hours. CBG: No results for input(s): GLUCAP in the last 168 hours. Lipid Profile: No results for input(s): CHOL, HDL, LDLCALC, TRIG, CHOLHDL, LDLDIRECT in the last 72 hours. Thyroid Function Tests: No results for input(s): TSH, T4TOTAL, FREET4, T3FREE, THYROIDAB in the last  72 hours. Anemia Panel: No results for input(s): VITAMINB12, FOLATE, FERRITIN, TIBC, IRON, RETICCTPCT in the last 72 hours. Urine analysis:    Component Value Date/Time   COLORURINE YELLOW 04/28/2016 2207   APPEARANCEUR CLEAR 04/28/2016 2207   LABSPEC 1.016 04/28/2016 2207   PHURINE 6.0 04/28/2016 2207   GLUCOSEU NEGATIVE 04/28/2016 2207   HGBUR NEGATIVE 04/28/2016 2207   BILIRUBINUR NEGATIVE 04/28/2016 2207   KETONESUR NEGATIVE 04/28/2016 2207   PROTEINUR NEGATIVE 04/28/2016 2207   NITRITE NEGATIVE 04/28/2016 2207   LEUKOCYTESUR NEGATIVE 04/28/2016 2207   Sepsis Labs: @LABRCNTIP (procalcitonin:4,lacticidven:4) )No results found for this or any previous visit (from the past 240 hour(s)).   Radiological Exams on Admission: Dg Hip Unilat With Pelvis 2-3 Views Left  Result Date: 1April 26, 202018 CLINICAL DATA:  Left hip pain after a fall this evening. EXAM: DG HIP (WITH OR WITHOUT PELVIS) 2-3V LEFT COMPARISON:   None. FINDINGS: Transverse fracture of the left femoral neck with superior subluxation of the distal fracture fragment resulting in varus angulation. No dislocation. No definite inter trochanteric extension although rotation limits evaluation. Pelvis appears intact. SI joints and symphysis pubis are not displaced. Degenerative changes in the lower lumbar spine. IMPRESSION: Transverse fracture left femoral neck with varus angulation. No definite trochanteric extension although rotation limits evaluation. Electronically Signed   By: Burman Nieves M.D.   On: 1April 26, 202018 22:23    EKG: Independently reviewed. Accelerated junctional rhythm   Assessment/Plan  1. Left hip fracture  - Pt presents with left hip pain after his bed broke, resulting in fall  - Radiographs confirm transverse fracture of left femoral neck with varus angulation - He was treated with analgesia in ED and orthopedic surgery was consulted  - Based on the available data, Mr. Aronoff presents a relatively high estimated risk probability of 2.5% for perioperative MI or cardiac arrest per Nolon Nations al, mainly related to poor functional capacity and not requiring any additional testing or intervention - Plan to keep NPO after midnight, continue supportive care with gentle IVF hydration, prn analgesia   2. Hypertension - Managed at home with HCTZ, held on admission while pt is NPO  - Monitor and treat prn with hydralazine IVP's   3. Hypothyroidism  - Continue Synthroid    4. Dementia  - Stable per report of HCPOA  5. Leukopenia, thrombocytopenia, polycythemia  - WBC is 3,800 on admission with platelets 126k and Hgb 18.6  - No evidence for infection; polycythemia could reflect volume contraction in setting of clinical hypovolemia  - Pt is being hydrated and CBC will be repeated in am     DVT prophylaxis: SCD's  Code Status: DNR; per HCPOA, okay to be FULL for surgery  Family Communication: Discussed with HCPOA Benjamin Banks)  by phone  Disposition Plan: Admit to med-surg Consults called: Orthopedic surgery Admission status: Inpatient    Briscoe Deutscher, MD [facility] Pager [phone]  If 7PM-7AM, please contact night-coverage [email or web address] Password [email or web address]  1April 26, 202018, 11:15 PM

## 2016-12-29 NOTE — H&P (View-Only) (Signed)
            Orthopedic Consultation   Benjamin Banks  MRN:[id number]  DOB: 01/21/1926  DOA: 12/29/2016  PCP: Stoneking, Hal, MD    Requesting physician: EDP  Reason for consultation: Left hip fracture   History of Present Illness: Benjamin Banks is an 83 y.o. male  Who presents after mechanical fall complaining of left hip pain. Unable to bear weight after the fall and was transported to the [facility] ED for further eval and treatment.  Patient denies other injuries or pain. Patient has a history of dementia and resides in a SNF.    Past Medical History: Past Medical History:  Diagnosis Date  . Anemia   . Benign positional vertigo   . Bilateral carpal tunnel syndrome   . BPH (benign prostatic hyperplasia)   . Chest pain    noncardiac  . Chronic anal fissure   . Chronic low back pain   . Dementia   . Disease    perrone  . Diverticulosis   . DJD (degenerative joint disease)   . Elevated blood sugar   . GERD (gastroesophageal reflux disease)   . Gilbert's syndrome   . H/O thyroidectomy   . Heart murmur   . Hypercholesteremia   . Hypertension    labils  . Hyperthyroidism   . Iron deficiency   . Lichen planus   . Peripheral neuropathy   . Polymyalgia rheumatica (HCC)     Past Surgical History: Past Surgical History:  Procedure Laterality Date  . APPENDECTOMY    . bottom teeth removed with implants    . hemrrhoidectomy    . left and right cataract    . left lens implant    . THYROIDECTOMY, PARTIAL    . TONSILLECTOMY       Allergies:   Allergies  Allergen Reactions  . Ativan [Lorazepam]     Somnolence      Social History:  reports that he has quit smoking. he has never used smokeless tobacco. He reports that he drinks alcohol. He reports that he does not use drugs.   Family History: Family History  Problem Relation Age of Onset  . COPD Mother   . COPD Father   . Alcohol abuse Father     Unacceptable: Noncontributory, unremarkable, or  negative. Acceptable: Family history reviewed and not pertinent (If you reviewed it)   Physical Exam: Vitals:   12/29/16 2340  BP: (!) 141/72  Pulse: 93  Resp: 20  Temp: 98.4 F (36.9 C)  TempSrc: Oral  SpO2: 90%    Constitutional: Appearance,  Alert and awake, oriented x3, not in any acute distress. Eyes: PERLA, EOMI, irises appear normal, anicteric sclera,  ENMT: external ears and nose appear normal, normal hearing or hard of hearing            Lips appears normal, oropharynx mucosa, tongue, posterior pharynx appear normal  Neck: neck appears normal, no masses, normal ROM, no thyromegaly, no JVD  CVS: S1-S2 clear, no murmur rubs or gallops, no LE edema, normal pedal pulses  Respiratory:  clear to auscultation bilaterally, no wheezing, rales or rhonchi. Respiratory effort normal. No accessory muscle use.  Abdomen: soft nontender, nondistended, normal bowel sounds, no hepatosplenomegaly, no hernias  Musculoskeletal: : no cyanosis, clubbing or edema noted bilaterally                       Joint/bones/muscle exam, strength, contractures or atrophy Neuro: Cranial nerves   II-XII intact, strength, sensation, reflexes Psych: judgement and insight appear normal, stable mood and affect, mental status Skin: no rashes or lesions or ulcers, no induration or nodules   (Anything < 9 systems with 2 bullets each down codes to level 1) (If patient refuses exam can't bill higher level) (Make sure to document decubitus ulcers present on admission -- if possible -- and whether patient has chronic indwelling catheter at time of admission)  Data reviewed:  I have personally reviewed following labs and imaging studies Labs:  CBC: Recent Labs  Lab 12/29/16 2243  WBC 3.8*  NEUTROABS 3.5  HGB 18.6*  HCT 53.4*  MCV 94.8  PLT 126*    Basic Metabolic Panel: Recent Labs  Lab 12/29/16 2243  NA 139  K 3.4*  CL 103  CO2 27  GLUCOSE 111*  BUN 34*  CREATININE 1.05  CALCIUM 9.2   GFR CrCl  cannot be calculated (Unknown ideal weight.). Liver Function Tests: No results for input(s): AST, ALT, ALKPHOS, BILITOT, PROT, ALBUMIN in the last 168 hours. No results for input(s): LIPASE, AMYLASE in the last 168 hours. No results for input(s): AMMONIA in the last 168 hours. Coagulation profile Recent Labs  Lab 12/29/16 2243  INR 1.16    Cardiac Enzymes: No results for input(s): CKTOTAL, CKMB, CKMBINDEX, TROPONINI in the last 168 hours. BNP: Invalid input(s): POCBNP CBG: No results for input(s): GLUCAP in the last 168 hours. D-Dimer No results for input(s): DDIMER in the last 72 hours. Hgb A1c No results for input(s): HGBA1C in the last 72 hours. Lipid Profile No results for input(s): CHOL, HDL, LDLCALC, TRIG, CHOLHDL, LDLDIRECT in the last 72 hours. Thyroid function studies No results for input(s): TSH, T4TOTAL, T3FREE, THYROIDAB in the last 72 hours.  Invalid input(s): FREET3 Anemia work up No results for input(s): VITAMINB12, FOLATE, FERRITIN, TIBC, IRON, RETICCTPCT in the last 72 hours. Urinalysis    Component Value Date/Time   COLORURINE YELLOW 04/28/2016 2207   APPEARANCEUR CLEAR 04/28/2016 2207   LABSPEC 1.016 04/28/2016 2207   PHURINE 6.0 04/28/2016 2207   GLUCOSEU NEGATIVE 04/28/2016 2207   HGBUR NEGATIVE 04/28/2016 2207   BILIRUBINUR NEGATIVE 04/28/2016 2207   KETONESUR NEGATIVE 04/28/2016 2207   PROTEINUR NEGATIVE 04/28/2016 2207   NITRITE NEGATIVE 04/28/2016 2207   LEUKOCYTESUR NEGATIVE 04/28/2016 2207     Sepsis Labs Invalid input(s): PROCALCITONIN,  WBC,  LACTICIDVEN Microbiology No results found for this or any previous visit (from the past 240 hour(s)).     Inpatient Medications:   Scheduled Meds: Continuous Infusions: . 0.9 % sodium chloride with kcl       Radiological Exams on Admission: Dg Hip Unilat With Pelvis 2-3 Views Left  Result Date: 12/29/2016 CLINICAL DATA:  Left hip pain after a fall this evening. EXAM: DG HIP (WITH OR  WITHOUT PELVIS) 2-3V LEFT COMPARISON:  None. FINDINGS: Transverse fracture of the left femoral neck with superior subluxation of the distal fracture fragment resulting in varus angulation. No dislocation. No definite inter trochanteric extension although rotation limits evaluation. Pelvis appears intact. SI joints and symphysis pubis are not displaced. Degenerative changes in the lower lumbar spine. IMPRESSION: Transverse fracture left femoral neck with varus angulation. No definite trochanteric extension although rotation limits evaluation. Electronically Signed   By: William  Stevens M.D.   On: 12/29/2016 22:23    Impression/Recommendations Principal Problem:   Closed left hip fracture, initial encounter (HCC) Active Problems:   Essential hypertension   Dementia   Depression   Hypothyroidism     Leukopenia   Thrombocytopenia (HCC)   Hypoxia  Patient has a displaced femoral neck fracture of the left hip after a mechanical fall.  He will require surgical management with a hemiarthroplasty, likely tomorrow (Tuesday 11/20). Will discuss with Dr Brian Swinteck, hip specialist in AM.  NPO for surgery. Medical workup and clearance underway.  CXR and EKG pending.    Thank you for this consultation.     Kedar Sedano,STEVEN R M.D. [facility] 12/30/2016, 12:01 AM       

## 2016-12-29 NOTE — ED Notes (Signed)
Bed: WA07 Expected date:  Expected time:  Means of arrival:  Comments: 83 yo M/fall left hip pain

## 2016-12-29 NOTE — Consult Note (Signed)
Orthopedic Consultation   Benjamin Banks  ZOX:[id number]  DOB: [birth year]  DOA: 12020-03-716  PCP: Merlene Laughter, MD    Requesting physician: EDP  Reason for consultation: Left hip fracture   History of Present Illness: Benjamin Banks is an 83 y.o. male  Who presents after mechanical fall complaining of left hip pain. Unable to bear weight after the fall and was transported to the [facility] ED for further eval and treatment.  Patient denies other injuries or pain. Patient has a history of dementia and resides in a SNF.    Past Medical History: Past Medical History:  Diagnosis Date  . Anemia   . Benign positional vertigo   . Bilateral carpal tunnel syndrome   . BPH (benign prostatic hyperplasia)   . Chest pain    noncardiac  . Chronic anal fissure   . Chronic low back pain   . Dementia   . Disease    perrone  . Diverticulosis   . DJD (degenerative joint disease)   . Elevated blood sugar   . GERD (gastroesophageal reflux disease)   . Gilbert's syndrome   . H/O thyroidectomy   . Heart murmur   . Hypercholesteremia   . Hypertension    labils  . Hyperthyroidism   . Iron deficiency   . Lichen planus   . Peripheral neuropathy   . Polymyalgia rheumatica (HCC)     Past Surgical History: Past Surgical History:  Procedure Laterality Date  . APPENDECTOMY    . bottom teeth removed with implants    . hemrrhoidectomy    . left and right cataract    . left lens implant    . THYROIDECTOMY, PARTIAL    . TONSILLECTOMY       Allergies:   Allergies  Allergen Reactions  . Ativan [Lorazepam]     Somnolence      Social History:  reports that he has quit smoking. he has never used smokeless tobacco. He reports that he drinks alcohol. He reports that he does not use drugs.   Family History: Family History  Problem Relation Age of Onset  . COPD Mother   . COPD Father   . Alcohol abuse Father     Unacceptable: Noncontributory, unremarkable, or  negative. Acceptable: Family history reviewed and not pertinent (If you reviewed it)   Physical Exam: Vitals:   12/29/16 2340  BP: (!) 141/72  Pulse: 93  Resp: 20  Temp: 98.4 F (36.9 C)  TempSrc: Oral  SpO2: 90%    Constitutional: Appearance,  Alert and awake, oriented x3, not in any acute distress. Eyes: PERLA, EOMI, irises appear normal, anicteric sclera,  ENMT: external ears and nose appear normal, normal hearing or hard of hearing            Lips appears normal, oropharynx mucosa, tongue, posterior pharynx appear normal  Neck: neck appears normal, no masses, normal ROM, no thyromegaly, no JVD  CVS: S1-S2 clear, no murmur rubs or gallops, no LE edema, normal pedal pulses  Respiratory:  clear to auscultation bilaterally, no wheezing, rales or rhonchi. Respiratory effort normal. No accessory muscle use.  Abdomen: soft nontender, nondistended, normal bowel sounds, no hepatosplenomegaly, no hernias  Musculoskeletal: : no cyanosis, clubbing or edema noted bilaterally                       Joint/bones/muscle exam, strength, contractures or atrophy Neuro: Cranial nerves  II-XII intact, strength, sensation, reflexes Psych: judgement and insight appear normal, stable mood and affect, mental status Skin: no rashes or lesions or ulcers, no induration or nodules   (Anything < 9 systems with 2 bullets each down codes to level 1) (If patient refuses exam can't bill higher level) (Make sure to document decubitus ulcers present on admission -- if possible -- and whether patient has chronic indwelling catheter at time of admission)  Data reviewed:  I have personally reviewed following labs and imaging studies Labs:  CBC: Recent Labs  Lab 12/29/16 2243  WBC 3.8*  NEUTROABS 3.5  HGB 18.6*  HCT 53.4*  MCV 94.8  PLT 126*    Basic Metabolic Panel: Recent Labs  Lab 12/29/16 2243  NA 139  K 3.4*  CL 103  CO2 27  GLUCOSE 111*  BUN 34*  CREATININE 1.05  CALCIUM 9.2   GFR CrCl  cannot be calculated (Unknown ideal weight.). Liver Function Tests: No results for input(s): AST, ALT, ALKPHOS, BILITOT, PROT, ALBUMIN in the last 168 hours. No results for input(s): LIPASE, AMYLASE in the last 168 hours. No results for input(s): AMMONIA in the last 168 hours. Coagulation profile Recent Labs  Lab 12/29/16 2243  INR 1.16    Cardiac Enzymes: No results for input(s): CKTOTAL, CKMB, CKMBINDEX, TROPONINI in the last 168 hours. BNP: Invalid input(s): POCBNP CBG: No results for input(s): GLUCAP in the last 168 hours. D-Dimer No results for input(s): DDIMER in the last 72 hours. Hgb A1c No results for input(s): HGBA1C in the last 72 hours. Lipid Profile No results for input(s): CHOL, HDL, LDLCALC, TRIG, CHOLHDL, LDLDIRECT in the last 72 hours. Thyroid function studies No results for input(s): TSH, T4TOTAL, T3FREE, THYROIDAB in the last 72 hours.  Invalid input(s): FREET3 Anemia work up No results for input(s): VITAMINB12, FOLATE, FERRITIN, TIBC, IRON, RETICCTPCT in the last 72 hours. Urinalysis    Component Value Date/Time   COLORURINE YELLOW 04/28/2016 2207   APPEARANCEUR CLEAR 04/28/2016 2207   LABSPEC 1.016 04/28/2016 2207   PHURINE 6.0 04/28/2016 2207   GLUCOSEU NEGATIVE 04/28/2016 2207   HGBUR NEGATIVE 04/28/2016 2207   BILIRUBINUR NEGATIVE 04/28/2016 2207   KETONESUR NEGATIVE 04/28/2016 2207   PROTEINUR NEGATIVE 04/28/2016 2207   NITRITE NEGATIVE 04/28/2016 2207   LEUKOCYTESUR NEGATIVE 04/28/2016 2207     Sepsis Labs Invalid input(s): PROCALCITONIN,  WBC,  LACTICIDVEN Microbiology No results found for this or any previous visit (from the past 240 hour(s)).     Inpatient Medications:   Scheduled Meds: Continuous Infusions: . 0.9 % sodium chloride with kcl       Radiological Exams on Admission: Dg Hip Unilat With Pelvis 2-3 Views Left  Result Date: 109-02-202018 CLINICAL DATA:  Left hip pain after a fall this evening. EXAM: DG HIP (WITH OR  WITHOUT PELVIS) 2-3V LEFT COMPARISON:  None. FINDINGS: Transverse fracture of the left femoral neck with superior subluxation of the distal fracture fragment resulting in varus angulation. No dislocation. No definite inter trochanteric extension although rotation limits evaluation. Pelvis appears intact. SI joints and symphysis pubis are not displaced. Degenerative changes in the lower lumbar spine. IMPRESSION: Transverse fracture left femoral neck with varus angulation. No definite trochanteric extension although rotation limits evaluation. Electronically Signed   By: Burman NievesWilliam  Stevens M.D.   On: 109-02-202018 22:23    Impression/Recommendations Principal Problem:   Closed left hip fracture, initial encounter [facility](HCC) Active Problems:   Essential hypertension   Dementia   Depression   Hypothyroidism  Leukopenia   Thrombocytopenia (HCC)   Hypoxia  Patient has a displaced femoral neck fracture of the left hip after a mechanical fall.  He will require surgical management with a hemiarthroplasty, likely tomorrow (Tuesday 11/20). Will discuss with Dr Samson FredericBrian Swinteck, hip specialist in AM.  NPO for surgery. Medical workup and clearance underway.  CXR and EKG pending.    Thank you for this consultation.     Verlee RossettiNORRIS,STEVEN R M.D. [facility] 12/30/2016, 12:01 AM

## 2016-12-30 ENCOUNTER — Inpatient Hospital Stay (HOSPITAL_COMMUNITY): Payer: Medicare Other

## 2016-12-30 ENCOUNTER — Inpatient Hospital Stay (HOSPITAL_COMMUNITY): Payer: Medicare Other | Admitting: Certified Registered Nurse Anesthetist

## 2016-12-30 ENCOUNTER — Other Ambulatory Visit: Payer: Self-pay

## 2016-12-30 ENCOUNTER — Encounter (HOSPITAL_COMMUNITY)
Admission: EM | Disposition: A | Discharge status: Skilled Nursing Facility | Payer: Self-pay | Source: Home / Self Care | Attending: Internal Medicine

## 2016-12-30 DIAGNOSIS — E039 Hypothyroidism, unspecified: Secondary | ICD-10-CM

## 2016-12-30 DIAGNOSIS — M25562 Pain in left knee: Secondary | ICD-10-CM | POA: Diagnosis not present

## 2016-12-30 DIAGNOSIS — N390 Urinary tract infection, site not specified: Secondary | ICD-10-CM | POA: Diagnosis not present

## 2016-12-30 DIAGNOSIS — W1830XA Fall on same level, unspecified, initial encounter: Secondary | ICD-10-CM | POA: Diagnosis present

## 2016-12-30 DIAGNOSIS — F322 Major depressive disorder, single episode, severe without psychotic features: Secondary | ICD-10-CM | POA: Diagnosis not present

## 2016-12-30 DIAGNOSIS — Z66 Do not resuscitate: Secondary | ICD-10-CM | POA: Diagnosis not present

## 2016-12-30 DIAGNOSIS — Z681 Body mass index (BMI) 19 or less, adult: Secondary | ICD-10-CM | POA: Diagnosis not present

## 2016-12-30 DIAGNOSIS — Z96642 Presence of left artificial hip joint: Secondary | ICD-10-CM | POA: Diagnosis not present

## 2016-12-30 DIAGNOSIS — Z4789 Encounter for other orthopedic aftercare: Secondary | ICD-10-CM | POA: Diagnosis not present

## 2016-12-30 DIAGNOSIS — F0391 Unspecified dementia with behavioral disturbance: Secondary | ICD-10-CM | POA: Diagnosis not present

## 2016-12-30 DIAGNOSIS — Z9181 History of falling: Secondary | ICD-10-CM | POA: Diagnosis not present

## 2016-12-30 DIAGNOSIS — M6281 Muscle weakness (generalized): Secondary | ICD-10-CM | POA: Diagnosis not present

## 2016-12-30 DIAGNOSIS — R2681 Unsteadiness on feet: Secondary | ICD-10-CM | POA: Diagnosis not present

## 2016-12-30 DIAGNOSIS — D696 Thrombocytopenia, unspecified: Secondary | ICD-10-CM

## 2016-12-30 DIAGNOSIS — I10 Essential (primary) hypertension: Secondary | ICD-10-CM | POA: Diagnosis not present

## 2016-12-30 DIAGNOSIS — I480 Paroxysmal atrial fibrillation: Secondary | ICD-10-CM | POA: Diagnosis not present

## 2016-12-30 DIAGNOSIS — E440 Moderate protein-calorie malnutrition: Secondary | ICD-10-CM | POA: Diagnosis not present

## 2016-12-30 DIAGNOSIS — S8992XA Unspecified injury of left lower leg, initial encounter: Secondary | ICD-10-CM | POA: Diagnosis not present

## 2016-12-30 DIAGNOSIS — E785 Hyperlipidemia, unspecified: Secondary | ICD-10-CM | POA: Diagnosis present

## 2016-12-30 DIAGNOSIS — F05 Delirium due to known physiological condition: Secondary | ICD-10-CM | POA: Diagnosis not present

## 2016-12-30 DIAGNOSIS — R638 Other symptoms and signs concerning food and fluid intake: Secondary | ICD-10-CM | POA: Diagnosis not present

## 2016-12-30 DIAGNOSIS — E876 Hypokalemia: Secondary | ICD-10-CM | POA: Diagnosis present

## 2016-12-30 DIAGNOSIS — R0902 Hypoxemia: Secondary | ICD-10-CM

## 2016-12-30 DIAGNOSIS — F330 Major depressive disorder, recurrent, mild: Secondary | ICD-10-CM | POA: Diagnosis not present

## 2016-12-30 DIAGNOSIS — S72009A Fracture of unspecified part of neck of unspecified femur, initial encounter for closed fracture: Secondary | ICD-10-CM | POA: Diagnosis not present

## 2016-12-30 DIAGNOSIS — I4891 Unspecified atrial fibrillation: Secondary | ICD-10-CM | POA: Diagnosis not present

## 2016-12-30 DIAGNOSIS — Z471 Aftercare following joint replacement surgery: Secondary | ICD-10-CM | POA: Diagnosis not present

## 2016-12-30 DIAGNOSIS — Z7982 Long term (current) use of aspirin: Secondary | ICD-10-CM | POA: Diagnosis not present

## 2016-12-30 DIAGNOSIS — R2689 Other abnormalities of gait and mobility: Secondary | ICD-10-CM | POA: Diagnosis not present

## 2016-12-30 DIAGNOSIS — D751 Secondary polycythemia: Secondary | ICD-10-CM | POA: Diagnosis present

## 2016-12-30 DIAGNOSIS — R296 Repeated falls: Secondary | ICD-10-CM | POA: Diagnosis present

## 2016-12-30 DIAGNOSIS — F0390 Unspecified dementia without behavioral disturbance: Secondary | ICD-10-CM

## 2016-12-30 DIAGNOSIS — M25552 Pain in left hip: Secondary | ICD-10-CM | POA: Diagnosis not present

## 2016-12-30 DIAGNOSIS — D72819 Decreased white blood cell count, unspecified: Secondary | ICD-10-CM

## 2016-12-30 DIAGNOSIS — F332 Major depressive disorder, recurrent severe without psychotic features: Secondary | ICD-10-CM | POA: Diagnosis not present

## 2016-12-30 DIAGNOSIS — S72042A Displaced fracture of base of neck of left femur, initial encounter for closed fracture: Secondary | ICD-10-CM | POA: Diagnosis not present

## 2016-12-30 DIAGNOSIS — E781 Pure hyperglyceridemia: Secondary | ICD-10-CM | POA: Diagnosis not present

## 2016-12-30 DIAGNOSIS — R4182 Altered mental status, unspecified: Secondary | ICD-10-CM | POA: Diagnosis not present

## 2016-12-30 DIAGNOSIS — S72012A Unspecified intracapsular fracture of left femur, initial encounter for closed fracture: Secondary | ICD-10-CM | POA: Diagnosis not present

## 2016-12-30 DIAGNOSIS — E890 Postprocedural hypothyroidism: Secondary | ICD-10-CM | POA: Diagnosis present

## 2016-12-30 DIAGNOSIS — Z87891 Personal history of nicotine dependence: Secondary | ICD-10-CM | POA: Diagnosis not present

## 2016-12-30 DIAGNOSIS — E46 Unspecified protein-calorie malnutrition: Secondary | ICD-10-CM | POA: Diagnosis not present

## 2016-12-30 DIAGNOSIS — D62 Acute posthemorrhagic anemia: Secondary | ICD-10-CM | POA: Diagnosis not present

## 2016-12-30 DIAGNOSIS — B9620 Unspecified Escherichia coli [E. coli] as the cause of diseases classified elsewhere: Secondary | ICD-10-CM | POA: Diagnosis not present

## 2016-12-30 DIAGNOSIS — D509 Iron deficiency anemia, unspecified: Secondary | ICD-10-CM | POA: Diagnosis present

## 2016-12-30 DIAGNOSIS — S72002A Fracture of unspecified part of neck of left femur, initial encounter for closed fracture: Secondary | ICD-10-CM | POA: Diagnosis present

## 2016-12-30 DIAGNOSIS — R41841 Cognitive communication deficit: Secondary | ICD-10-CM | POA: Diagnosis not present

## 2016-12-30 DIAGNOSIS — F329 Major depressive disorder, single episode, unspecified: Secondary | ICD-10-CM | POA: Diagnosis not present

## 2016-12-30 DIAGNOSIS — R410 Disorientation, unspecified: Secondary | ICD-10-CM | POA: Diagnosis not present

## 2016-12-30 DIAGNOSIS — K5901 Slow transit constipation: Secondary | ICD-10-CM | POA: Diagnosis not present

## 2016-12-30 DIAGNOSIS — Z781 Physical restraint status: Secondary | ICD-10-CM | POA: Diagnosis not present

## 2016-12-30 DIAGNOSIS — R278 Other lack of coordination: Secondary | ICD-10-CM | POA: Diagnosis not present

## 2016-12-30 DIAGNOSIS — Z79890 Hormone replacement therapy (postmenopausal): Secondary | ICD-10-CM | POA: Diagnosis not present

## 2016-12-30 DIAGNOSIS — N179 Acute kidney failure, unspecified: Secondary | ICD-10-CM | POA: Diagnosis not present

## 2016-12-30 HISTORY — PX: TOTAL HIP ARTHROPLASTY: SHX124

## 2016-12-30 LAB — BASIC METABOLIC PANEL
Anion gap: 6 (ref 5–15)
BUN: 33 mg/dL — AB (ref 6–20)
CALCIUM: 8.5 mg/dL — AB (ref 8.9–10.3)
CHLORIDE: 104 mmol/L (ref 101–111)
CO2: 28 mmol/L (ref 22–32)
CREATININE: 1.07 mg/dL (ref 0.61–1.24)
GFR calc non Af Amer: 60 mL/min — ABNORMAL LOW (ref >60)
Glucose, Bld: 125 mg/dL — ABNORMAL HIGH (ref 65–99)
Potassium: 4.2 mmol/L (ref 3.5–5.1)
SODIUM: 138 mmol/L (ref 135–145)

## 2016-12-30 LAB — URINALYSIS, ROUTINE W REFLEX MICROSCOPIC
BILIRUBIN URINE: NEGATIVE
Glucose, UA: NEGATIVE mg/dL
Ketones, ur: NEGATIVE mg/dL
Nitrite: POSITIVE — AB
PH: 5 (ref 5.0–8.0)
Protein, ur: NEGATIVE mg/dL
SPECIFIC GRAVITY, URINE: 1.019 (ref 1.005–1.030)

## 2016-12-30 LAB — CBC WITH DIFFERENTIAL/PLATELET
BASOS PCT: 0 %
Basophils Absolute: 0 10*3/uL (ref 0.0–0.1)
EOS ABS: 0 10*3/uL (ref 0.0–0.7)
EOS PCT: 0 %
HCT: 32.1 % — ABNORMAL LOW (ref 39.0–52.0)
Hemoglobin: 10.6 g/dL — ABNORMAL LOW (ref 13.0–17.0)
LYMPHS ABS: 0.6 10*3/uL — AB (ref 0.7–4.0)
Lymphocytes Relative: 4 %
MCH: 31.9 pg (ref 26.0–34.0)
MCHC: 33 g/dL (ref 30.0–36.0)
MCV: 96.7 fL (ref 78.0–100.0)
MONOS PCT: 5 %
Monocytes Absolute: 0.7 10*3/uL (ref 0.1–1.0)
Neutro Abs: 12.5 10*3/uL — ABNORMAL HIGH (ref 1.7–7.7)
Neutrophils Relative %: 91 %
PLATELETS: 227 10*3/uL (ref 150–400)
RBC: 3.32 MIL/uL — ABNORMAL LOW (ref 4.22–5.81)
RDW: 13.9 % (ref 11.5–15.5)
WBC: 13.8 10*3/uL — ABNORMAL HIGH (ref 4.0–10.5)

## 2016-12-30 LAB — TYPE AND SCREEN
ABO/RH(D): O NEG
ANTIBODY SCREEN: NEGATIVE

## 2016-12-30 LAB — SURGICAL PCR SCREEN
MRSA, PCR: NEGATIVE
Staphylococcus aureus: NEGATIVE

## 2016-12-30 SURGERY — ARTHROPLASTY, HIP, TOTAL, ANTERIOR APPROACH
Anesthesia: General | Site: Hip | Laterality: Left

## 2016-12-30 MED ORDER — LEVOTHYROXINE SODIUM 50 MCG PO TABS
50.0000 ug | ORAL_TABLET | Freq: Every day | ORAL | Status: DC
  Administered 2016-12-30 – 2017-01-09 (×10): 50 ug via ORAL
  Filled 2016-12-30 (×10): qty 1

## 2016-12-30 MED ORDER — LIDOCAINE 2% (20 MG/ML) 5 ML SYRINGE
INTRAMUSCULAR | Status: DC | PRN
  Administered 2016-12-30: 60 mg via INTRAVENOUS

## 2016-12-30 MED ORDER — BUPIVACAINE-EPINEPHRINE 0.25% -1:200000 IJ SOLN
INTRAMUSCULAR | Status: DC | PRN
  Administered 2016-12-30: 30 mL

## 2016-12-30 MED ORDER — DOCUSATE SODIUM 100 MG PO CAPS
200.0000 mg | ORAL_CAPSULE | Freq: Every day | ORAL | Status: DC
  Administered 2017-01-01 – 2017-01-09 (×7): 200 mg via ORAL
  Filled 2016-12-30 (×7): qty 2

## 2016-12-30 MED ORDER — ROCURONIUM BROMIDE 50 MG/5ML IV SOSY
PREFILLED_SYRINGE | INTRAVENOUS | Status: AC
  Filled 2016-12-30: qty 5

## 2016-12-30 MED ORDER — KETOROLAC TROMETHAMINE 30 MG/ML IJ SOLN
INTRAMUSCULAR | Status: DC | PRN
  Administered 2016-12-30: 30 mg via INTRAVENOUS

## 2016-12-30 MED ORDER — HYDROCODONE-ACETAMINOPHEN 5-325 MG PO TABS
1.0000 | ORAL_TABLET | Freq: Four times a day (QID) | ORAL | Status: DC | PRN
Start: 2016-12-30 — End: 2017-01-09
  Administered 2017-01-01 – 2017-01-04 (×9): 1 via ORAL
  Administered 2017-01-04 (×2): 2 via ORAL
  Administered 2017-01-04 – 2017-01-05 (×3): 1 via ORAL
  Administered 2017-01-06 – 2017-01-07 (×3): 2 via ORAL
  Administered 2017-01-09: 1 via ORAL
  Filled 2016-12-30: qty 2
  Filled 2016-12-30 (×6): qty 1
  Filled 2016-12-30: qty 2
  Filled 2016-12-30 (×5): qty 1
  Filled 2016-12-30: qty 2
  Filled 2016-12-30: qty 1
  Filled 2016-12-30: qty 2
  Filled 2016-12-30: qty 1
  Filled 2016-12-30: qty 2

## 2016-12-30 MED ORDER — PHENYLEPHRINE 40 MCG/ML (10ML) SYRINGE FOR IV PUSH (FOR BLOOD PRESSURE SUPPORT)
PREFILLED_SYRINGE | INTRAVENOUS | Status: AC
  Filled 2016-12-30: qty 10

## 2016-12-30 MED ORDER — MIRTAZAPINE 15 MG PO TABS
15.0000 mg | ORAL_TABLET | Freq: Every day | ORAL | Status: DC
  Administered 2016-12-30 – 2017-01-08 (×9): 15 mg via ORAL
  Filled 2016-12-30 (×9): qty 1

## 2016-12-30 MED ORDER — SODIUM CHLORIDE 0.9 % IJ SOLN
INTRAMUSCULAR | Status: AC
  Filled 2016-12-30: qty 50

## 2016-12-30 MED ORDER — PROPOFOL 10 MG/ML IV BOLUS
INTRAVENOUS | Status: AC
  Filled 2016-12-30: qty 20

## 2016-12-30 MED ORDER — ATORVASTATIN CALCIUM 10 MG PO TABS
10.0000 mg | ORAL_TABLET | Freq: Every day | ORAL | Status: DC
  Administered 2016-12-31 – 2017-01-08 (×8): 10 mg via ORAL
  Filled 2016-12-30 (×9): qty 1

## 2016-12-30 MED ORDER — PROPOFOL 10 MG/ML IV BOLUS
INTRAVENOUS | Status: DC | PRN
  Administered 2016-12-30: 100 mg via INTRAVENOUS

## 2016-12-30 MED ORDER — SENNOSIDES-DOCUSATE SODIUM 8.6-50 MG PO TABS
1.0000 | ORAL_TABLET | Freq: Every evening | ORAL | Status: DC | PRN
  Filled 2016-12-30: qty 1

## 2016-12-30 MED ORDER — LORAZEPAM 2 MG/ML IJ SOLN
1.0000 mg | Freq: Once | INTRAMUSCULAR | Status: AC
  Administered 2016-12-30: 1 mg via INTRAVENOUS
  Filled 2016-12-30: qty 1

## 2016-12-30 MED ORDER — PHENYLEPHRINE 40 MCG/ML (10ML) SYRINGE FOR IV PUSH (FOR BLOOD PRESSURE SUPPORT)
PREFILLED_SYRINGE | INTRAVENOUS | Status: DC | PRN
  Administered 2016-12-30 (×2): 80 ug via INTRAVENOUS
  Administered 2016-12-30: 120 ug via INTRAVENOUS

## 2016-12-30 MED ORDER — SERTRALINE HCL 25 MG PO TABS
25.0000 mg | ORAL_TABLET | Freq: Every day | ORAL | Status: DC
  Administered 2016-12-30 – 2017-01-09 (×10): 25 mg via ORAL
  Filled 2016-12-30 (×10): qty 1

## 2016-12-30 MED ORDER — PRO-STAT SUGAR FREE PO LIQD
30.0000 mL | Freq: Two times a day (BID) | ORAL | Status: DC
  Administered 2017-01-01 – 2017-01-06 (×9): 30 mL via ORAL
  Filled 2016-12-30 (×9): qty 30

## 2016-12-30 MED ORDER — BISACODYL 5 MG PO TBEC
5.0000 mg | DELAYED_RELEASE_TABLET | Freq: Every day | ORAL | Status: DC | PRN
  Administered 2017-01-08: 5 mg via ORAL
  Filled 2016-12-30: qty 1

## 2016-12-30 MED ORDER — ONDANSETRON HCL 4 MG/2ML IJ SOLN
INTRAMUSCULAR | Status: DC | PRN
  Administered 2016-12-30: 4 mg via INTRAVENOUS

## 2016-12-30 MED ORDER — DEXAMETHASONE SODIUM PHOSPHATE 10 MG/ML IJ SOLN
INTRAMUSCULAR | Status: DC | PRN
  Administered 2016-12-30: 10 mg via INTRAVENOUS

## 2016-12-30 MED ORDER — SODIUM CHLORIDE 0.9 % IR SOLN: Status: DC | PRN

## 2016-12-30 MED ORDER — SODIUM CHLORIDE 0.9 % IV SOLN
INTRAVENOUS | Status: AC | PRN
  Administered 2016-12-30: 30 mL

## 2016-12-30 MED ORDER — CEFAZOLIN SODIUM-DEXTROSE 2-4 GM/100ML-% IV SOLN
INTRAVENOUS | Status: AC
  Filled 2016-12-30: qty 100

## 2016-12-30 MED ORDER — ONDANSETRON HCL 4 MG/2ML IJ SOLN: 4.0000 mg | Freq: Four times a day (QID) | INTRAMUSCULAR | Status: DC | PRN

## 2016-12-30 MED ORDER — PHENOL 1.4 % MT LIQD
1.0000 | OROMUCOSAL | Status: DC | PRN
  Filled 2016-12-30: qty 177

## 2016-12-30 MED ORDER — CEFAZOLIN SODIUM-DEXTROSE 2-4 GM/100ML-% IV SOLN
2.0000 g | INTRAVENOUS | Status: AC
  Administered 2016-12-30: 2 g via INTRAVENOUS

## 2016-12-30 MED ORDER — SUGAMMADEX SODIUM 200 MG/2ML IV SOLN
INTRAVENOUS | Status: AC
  Filled 2016-12-30: qty 2

## 2016-12-30 MED ORDER — METOCLOPRAMIDE HCL 5 MG/ML IJ SOLN: 5.0000 mg | Freq: Three times a day (TID) | INTRAMUSCULAR | Status: DC | PRN

## 2016-12-30 MED ORDER — POVIDONE-IODINE 10 % EX SWAB
2.0000 "application " | Freq: Once | CUTANEOUS | Status: AC
  Administered 2016-12-30: 2 via TOPICAL

## 2016-12-30 MED ORDER — LIDOCAINE 2% (20 MG/ML) 5 ML SYRINGE
INTRAMUSCULAR | Status: AC
  Filled 2016-12-30: qty 5

## 2016-12-30 MED ORDER — SODIUM CHLORIDE 0.9 % IR SOLN
Status: DC | PRN
  Administered 2016-12-30: 1000 mL

## 2016-12-30 MED ORDER — ONDANSETRON HCL 4 MG/2ML IJ SOLN
INTRAMUSCULAR | Status: AC
  Filled 2016-12-30: qty 2

## 2016-12-30 MED ORDER — DEXAMETHASONE SODIUM PHOSPHATE 10 MG/ML IJ SOLN
INTRAMUSCULAR | Status: AC
  Filled 2016-12-30: qty 1

## 2016-12-30 MED ORDER — BUSPIRONE HCL 5 MG PO TABS
7.5000 mg | ORAL_TABLET | Freq: Two times a day (BID) | ORAL | Status: DC
  Administered 2016-12-30 – 2017-01-01 (×4): 7.5 mg via ORAL
  Filled 2016-12-30 (×4): qty 2

## 2016-12-30 MED ORDER — MEPERIDINE HCL 50 MG/ML IJ SOLN: 6.2500 mg | INTRAMUSCULAR | Status: DC | PRN

## 2016-12-30 MED ORDER — DEXTROSE 5 % IV SOLN
1.0000 g | INTRAVENOUS | Status: AC
  Administered 2016-12-30 – 2017-01-04 (×5): 1 g via INTRAVENOUS
  Filled 2016-12-30 (×7): qty 10

## 2016-12-30 MED ORDER — KETOROLAC TROMETHAMINE 30 MG/ML IJ SOLN
INTRAMUSCULAR | Status: AC
  Filled 2016-12-30: qty 1

## 2016-12-30 MED ORDER — CEFAZOLIN SODIUM-DEXTROSE 2-4 GM/100ML-% IV SOLN
2.0000 g | Freq: Four times a day (QID) | INTRAVENOUS | Status: AC
  Administered 2016-12-30 – 2016-12-31 (×2): 2 g via INTRAVENOUS
  Filled 2016-12-30 (×2): qty 100

## 2016-12-30 MED ORDER — PHENYLEPHRINE HCL 10 MG/ML IJ SOLN
INTRAMUSCULAR | Status: AC
  Filled 2016-12-30: qty 1

## 2016-12-30 MED ORDER — DEXTROSE 5 % IV SOLN
INTRAVENOUS | Status: DC | PRN
  Administered 2016-12-30: 100 ug/min via INTRAVENOUS

## 2016-12-30 MED ORDER — MENTHOL 3 MG MT LOZG
1.0000 | LOZENGE | OROMUCOSAL | Status: DC | PRN
  Filled 2016-12-30: qty 9

## 2016-12-30 MED ORDER — HYDROMORPHONE HCL 1 MG/ML IJ SOLN: 0.2500 mg | INTRAMUSCULAR | Status: DC | PRN

## 2016-12-30 MED ORDER — MORPHINE SULFATE (PF) 2 MG/ML IV SOLN
0.5000 mg | INTRAVENOUS | Status: DC | PRN
  Administered 2016-12-30: 0.5 mg via INTRAVENOUS
  Filled 2016-12-30: qty 1

## 2016-12-30 MED ORDER — FENTANYL CITRATE (PF) 100 MCG/2ML IJ SOLN
INTRAMUSCULAR | Status: AC
  Filled 2016-12-30: qty 2

## 2016-12-30 MED ORDER — CHLORHEXIDINE GLUCONATE 4 % EX LIQD
60.0000 mL | Freq: Once | CUTANEOUS | Status: AC
  Administered 2016-12-30: 4 via TOPICAL
  Filled 2016-12-30: qty 60

## 2016-12-30 MED ORDER — ISOPROPYL ALCOHOL 70 % SOLN
Status: AC
  Filled 2016-12-30: qty 480

## 2016-12-30 MED ORDER — HYDRALAZINE HCL 20 MG/ML IJ SOLN
10.0000 mg | INTRAMUSCULAR | Status: DC | PRN
  Administered 2017-01-05: 10 mg via INTRAVENOUS
  Filled 2016-12-30: qty 1

## 2016-12-30 MED ORDER — ACETAMINOPHEN 650 MG RE SUPP: 650.0000 mg | Freq: Four times a day (QID) | RECTAL | Status: DC | PRN

## 2016-12-30 MED ORDER — ONDANSETRON HCL 4 MG/2ML IJ SOLN: 4.0000 mg | Freq: Once | INTRAMUSCULAR | Status: DC | PRN

## 2016-12-30 MED ORDER — TRANEXAMIC ACID 1000 MG/10ML IV SOLN
1000.0000 mg | INTRAVENOUS | Status: AC
  Administered 2016-12-30: 1000 mg via INTRAVENOUS
  Filled 2016-12-30: qty 1100

## 2016-12-30 MED ORDER — MORPHINE SULFATE (PF) 4 MG/ML IV SOLN
0.5000 mg | INTRAVENOUS | Status: DC | PRN
  Administered 2016-12-30 – 2017-01-05 (×9): 0.52 mg via INTRAVENOUS
  Filled 2016-12-30 (×10): qty 1

## 2016-12-30 MED ORDER — LACTATED RINGERS IV SOLN
INTRAVENOUS | Status: DC
  Administered 2016-12-30 (×2): via INTRAVENOUS

## 2016-12-30 MED ORDER — WATER FOR IRRIGATION, STERILE IR SOLN
Status: DC | PRN
  Administered 2016-12-30: 2000 mL

## 2016-12-30 MED ORDER — ONDANSETRON HCL 4 MG PO TABS: 4.0000 mg | ORAL_TABLET | Freq: Four times a day (QID) | ORAL | Status: DC | PRN

## 2016-12-30 MED ORDER — METOCLOPRAMIDE HCL 5 MG PO TABS: 5.0000 mg | ORAL_TABLET | Freq: Three times a day (TID) | ORAL | Status: DC | PRN

## 2016-12-30 MED ORDER — BUPIVACAINE HCL (PF) 0.25 % IJ SOLN
INTRAMUSCULAR | Status: AC
  Filled 2016-12-30: qty 30

## 2016-12-30 MED ORDER — ASPIRIN EC 81 MG PO TBEC
81.0000 mg | DELAYED_RELEASE_TABLET | Freq: Two times a day (BID) | ORAL | Status: DC
  Administered 2016-12-31 – 2017-01-09 (×17): 81 mg via ORAL
  Filled 2016-12-30 (×18): qty 1

## 2016-12-30 MED ORDER — SUGAMMADEX SODIUM 200 MG/2ML IV SOLN
INTRAVENOUS | Status: DC | PRN
  Administered 2016-12-30: 150 mg via INTRAVENOUS

## 2016-12-30 MED ORDER — ACETAMINOPHEN 325 MG PO TABS
650.0000 mg | ORAL_TABLET | Freq: Four times a day (QID) | ORAL | Status: DC | PRN
  Administered 2017-01-07: 650 mg via ORAL
  Filled 2016-12-30: qty 2

## 2016-12-30 MED ORDER — SODIUM CHLORIDE 0.9 % IV SOLN
INTRAVENOUS | Status: DC
  Administered 2016-12-30: 17:00:00 via INTRAVENOUS

## 2016-12-30 MED ORDER — FENTANYL CITRATE (PF) 100 MCG/2ML IJ SOLN
INTRAMUSCULAR | Status: DC | PRN
  Administered 2016-12-30 (×3): 50 ug via INTRAVENOUS

## 2016-12-30 MED ORDER — ROCURONIUM BROMIDE 50 MG/5ML IV SOSY
PREFILLED_SYRINGE | INTRAVENOUS | Status: DC | PRN
  Administered 2016-12-30: 50 mg via INTRAVENOUS
  Administered 2016-12-30: 10 mg via INTRAVENOUS

## 2016-12-30 SURGICAL SUPPLY — 46 items
ADH SKN CLS APL DERMABOND .7 (GAUZE/BANDAGES/DRESSINGS) ×1
BAG DECANTER FOR FLEXI CONT (MISCELLANEOUS) ×4 IMPLANT
BAG SPEC THK2 15X12 ZIP CLS (MISCELLANEOUS)
BAG ZIPLOCK 12X15 (MISCELLANEOUS) IMPLANT
CAPT HIP HEMI 2 ×2 IMPLANT
CATH COUDE 5CC RIBBED (CATHETERS) IMPLANT
CATH RIBBED COUDE 5CC (CATHETERS) ×3
CHLORAPREP W/TINT 26ML (MISCELLANEOUS) ×3 IMPLANT
CLOTH BEACON ORANGE TIMEOUT ST (SAFETY) ×3 IMPLANT
COVER PERINEAL POST (MISCELLANEOUS) ×3 IMPLANT
COVER SURGICAL LIGHT HANDLE (MISCELLANEOUS) ×3 IMPLANT
DECANTER SPIKE VIAL GLASS SM (MISCELLANEOUS) ×3 IMPLANT
DERMABOND ADVANCED (GAUZE/BANDAGES/DRESSINGS) ×2
DERMABOND ADVANCED .7 DNX12 (GAUZE/BANDAGES/DRESSINGS) ×2 IMPLANT
DRAPE SHEET LG 3/4 BI-LAMINATE (DRAPES) ×9 IMPLANT
DRAPE STERI IOBAN 125X83 (DRAPES) ×3 IMPLANT
DRAPE U-SHAPE 47X51 STRL (DRAPES) ×6 IMPLANT
DRSG AQUACEL AG ADV 3.5X10 (GAUZE/BANDAGES/DRESSINGS) ×3 IMPLANT
ELECT PENCIL ROCKER SW 15FT (MISCELLANEOUS) ×3 IMPLANT
ELECT REM PT RETURN 15FT ADLT (MISCELLANEOUS) ×3 IMPLANT
GAUZE SPONGE 4X4 12PLY STRL (GAUZE/BANDAGES/DRESSINGS) ×3 IMPLANT
GLOVE BIO SURGEON STRL SZ8.5 (GLOVE) ×6 IMPLANT
GLOVE BIOGEL PI IND STRL 8.5 (GLOVE) ×1 IMPLANT
GLOVE BIOGEL PI INDICATOR 8.5 (GLOVE) ×2
GOWN SPEC L3 XXLG W/TWL (GOWN DISPOSABLE) ×3 IMPLANT
HANDPIECE INTERPULSE COAX TIP (DISPOSABLE) ×3
HOLDER FOLEY CATH W/STRAP (MISCELLANEOUS) ×3 IMPLANT
HOOD PEEL AWAY FLYTE STAYCOOL (MISCELLANEOUS) ×12 IMPLANT
MARKER SKIN DUAL TIP RULER LAB (MISCELLANEOUS) ×3 IMPLANT
NEEDLE SPNL 18GX3.5 QUINCKE PK (NEEDLE) ×3 IMPLANT
PACK ANTERIOR HIP CUSTOM (KITS) ×3 IMPLANT
SAW OSC TIP CART 19.5X105X1.3 (SAW) ×3 IMPLANT
SEALER BIPOLAR AQUA 6.0 (INSTRUMENTS) ×3 IMPLANT
SET HNDPC FAN SPRY TIP SCT (DISPOSABLE) ×1 IMPLANT
SUT ETHIBOND NAB CT1 #1 30IN (SUTURE) ×6 IMPLANT
SUT MNCRL AB 3-0 PS2 18 (SUTURE) ×3 IMPLANT
SUT MON AB 2-0 CT1 36 (SUTURE) ×6 IMPLANT
SUT STRATAFIX PDO 1 14 VIOLET (SUTURE) ×3
SUT STRATFX PDO 1 14 VIOLET (SUTURE) ×1
SUT VIC AB 2-0 CT1 27 (SUTURE) ×3
SUT VIC AB 2-0 CT1 TAPERPNT 27 (SUTURE) ×1 IMPLANT
SUTURE STRATFX PDO 1 14 VIOLET (SUTURE) ×1 IMPLANT
SYR 50ML LL SCALE MARK (SYRINGE) ×3 IMPLANT
TRAY FOLEY W/METER SILVER 16FR (SET/KITS/TRAYS/PACK) ×2 IMPLANT
WATER STERILE IRR 1000ML POUR (IV SOLUTION) ×5 IMPLANT
YANKAUER SUCT BULB TIP 10FT TU (MISCELLANEOUS) ×3 IMPLANT

## 2016-12-30 NOTE — Progress Notes (Signed)
Give report to CambridgeJoanne @ [phone].  Now if you are ready.

## 2016-12-30 NOTE — Interval H&P Note (Signed)
History and Physical Interval Note:  12/30/2016 12:52 PM  Benjamin Banks  has presented today for surgery, with the diagnosis of fractured left hip  The various methods of treatment have been discussed with the patient and family. After consideration of risks, benefits and other options for treatment, the patient has consented to  Procedure(s): LEFT HIP HEMIARTHROPLASTY  ANTERIOR APPROACH (Left) as a surgical intervention .  The patient's history has been reviewed, patient examined, no change in status, stable for surgery.  I have reviewed the patient's chart and labs.  Questions were answered to the patient's satisfaction.    I was asked to assume care by Dr. Ranell PatrickNorris. Patient has severe dementia with multiple falls. Presented with L femoral neck fracture. Discussed R/B/A of surgery with patient's SIL who is the POA. Surgery is required to restore mobility and help with pain control.  The risks, benefits, and alternatives were discussed with the patient/POA. There are risks associated with the surgery including, but not limited to, problems with anesthesia (death), infection, instability (giving out of the joint), dislocation, differences in leg length/angulation/rotation, fracture of bones, loosening or failure of implants, hematoma (blood accumulation) which may require surgical drainage, blood clots, pulmonary embolism, nerve injury (foot drop and lateral thigh numbness), and blood vessel injury. The patient/POA understand these risks and elect to proceed.    Iline OvenBrian J Leiann Sporer

## 2016-12-30 NOTE — Anesthesia Procedure Notes (Signed)
Procedure Name: Intubation Date/Time: 12/30/2016 1:01 PM Performed by: Montel Clock, CRNA Pre-anesthesia Checklist: Patient identified, Emergency Drugs available, Suction available, Patient being monitored and Timeout performed Patient Re-evaluated:Patient Re-evaluated prior to induction Oxygen Delivery Method: Circle system utilized Preoxygenation: Pre-oxygenation with 100% oxygen Induction Type: IV induction Ventilation: Two handed mask ventilation required and Oral airway inserted - appropriate to patient size Laryngoscope Size: Mac and 3 Grade View: Grade I Tube type: Oral Tube size: 7.5 mm Number of attempts: 1 Airway Equipment and Method: Stylet Placement Confirmation: ETT inserted through vocal cords under direct vision,  positive ETCO2 and breath sounds checked- equal and bilateral Secured at: 22 cm Tube secured with: Tape Dental Injury: Teeth and Oropharynx as per pre-operative assessment  Comments: Required second hand to seal mask to face

## 2016-12-30 NOTE — Anesthesia Postprocedure Evaluation (Signed)
Anesthesia Post Note  Patient: Benjamin Banks  Procedure(s) Performed: LEFT HIP HEMIARTHROPLASTY  ANTERIOR APPROACH (Left Hip)     Patient location during evaluation: PACU Anesthesia Type: General Level of consciousness: sedated and patient cooperative Pain management: pain level controlled Vital Signs Assessment: post-procedure vital signs reviewed and stable Respiratory status: spontaneous breathing Cardiovascular status: stable Anesthetic complications: no    Last Vitals:  Vitals:   12/30/16 1643 12/30/16 1711  BP: 125/69 136/80  Pulse: 63 61  Resp: 12   Temp: (!) 36.2 C 36.8 C  SpO2: 93% 99%    Last Pain:  Vitals:   12/30/16 1711  TempSrc: Oral  PainSc:                  Lewie LoronJohn Elira Colasanti

## 2016-12-30 NOTE — Progress Notes (Signed)
Patient came back to 5E at 1710; respond to pain and name; vital sign was taken as ordered, dressing intact and no bleeding; continue to monitor.

## 2016-12-30 NOTE — Anesthesia Preprocedure Evaluation (Signed)
Anesthesia Evaluation  Patient identified by MRN, date of birth, ID band Patient awake    Reviewed: Allergy & Precautions, NPO status , Patient's Chart, lab work & pertinent test results  Airway Mallampati: I  TM Distance: >3 FB Neck ROM: Full    Dental   Pulmonary former smoker,    Pulmonary exam normal        Cardiovascular hypertension, Pt. on medications Normal cardiovascular exam     Neuro/Psych Depression    GI/Hepatic GERD  ,  Endo/Other    Renal/GU Renal InsufficiencyRenal disease     Musculoskeletal   Abdominal   Peds  Hematology   Anesthesia Other Findings   Reproductive/Obstetrics                             Anesthesia Physical Anesthesia Plan  ASA: III  Anesthesia Plan: General   Post-op Pain Management:    Induction: Intravenous  PONV Risk Score and Plan: 2 and Ondansetron and Treatment may vary due to age or medical condition  Airway Management Planned: Oral ETT  Additional Equipment:   Intra-op Plan:   Post-operative Plan: Extubation in OR  Informed Consent: I have reviewed the patients History and Physical, chart, labs and discussed the procedure including the risks, benefits and alternatives for the proposed anesthesia with the patient or authorized representative who has indicated his/her understanding and acceptance.     Plan Discussed with: CRNA and Surgeon  Anesthesia Plan Comments:         Anesthesia Quick Evaluation

## 2016-12-30 NOTE — Progress Notes (Signed)
PROGRESS NOTE    Benjamin Banks  IHK:[id number] DOB: [birth year] DOA: 1Nov 27, 202018 PCP: Merlene LaughterStoneking, Hal, MD    Brief Narrative:  Patient is a 83 year old gentleman history of dementia, hypothyroidism, hypertension, chronic iron deficiency anemia presented to the ED with left hip pain after mechanical fall.  Patient noted to have a displaced femoral neck fracture on the left.  Patient seen in consultation by orthopedics and patient for left hip repair.  Patient also noted to have a probable UTI and placed on IV Rocephin.  Antihypertensive medications on hold.  Follow.   Assessment & Plan:   Principal Problem:   Closed left hip fracture, initial encounter [facility](HCC) Active Problems:   Essential hypertension   Dementia   Depression   Hypothyroidism   Leukopenia   Thrombocytopenia (HCC)   Hypoxia   Acute lower UTI  #1 closed displaced femoral neck fracture left hip Secondary to mechanical fall.  Patient likely relative high estimated risk of 2.5% for perioperative MI or cardiac arrest.  Lupita scale mainly secondary to poor functional capacity and not requiring any further testing or intervention at this time.  Continue gentle hydration.  Pain management.  Patient for hip repair today per orthopedics.  2.  Hypertension Antihypertensive medications on hold as patient currently n.p.o.  Blood pressure stable.  Hydralazine as needed.  3.  Hypothyroidism Continue home dose Synthroid.  4.  Acute UTI Urine cultures pending.  Placed on IV Rocephin.  5.  Dementia Stable.  6.  Leukopenia/thrombocytopenia/polycythemia Polycythemia likely secondary to dehydration/volume contraction.  Hemoglobin level currently at 10.6 from 18.6 on admission.  Current hemoglobin close to baseline.  We will check an anemia panel.  Thrombocytopenia improved.  Patient now with a leukocytosis likely secondary to probable UTI.  Urine cultures pending.  Patient to be started on IV Rocephin.    DVT prophylaxis: Per  orthopedics Code Status: DNR Family Communication: No family at bedside. Disposition Plan: Likely skilled nursing facility pending surgery.   Consultants:   Orthopedics: Dr. Ranell PatrickNorris 1Nov 27, 202018  Procedures:   Chest x-ray 1Nov 27, 202018  Plain films of the left knee 12/30/2016  The left hip and pelvis 1Nov 27, 202018  Antimicrobials:   IV Rocephin 12/30/2016   Subjective: Patient pleasantly confused.  Patient denies any shortness of breath.  No chest pain.  Patient getting ready for surgery.  Objective: Vitals:   12/30/16 0130 12/30/16 0200 12/30/16 0300 12/30/16 0351  BP: 115/63 139/73 119/60 111/60  Pulse: 78 83 73 74  Resp: 14 16  18   Temp:    97.9 F (36.6 C)  TempSrc:    Oral  SpO2: (!) 86% (!) 80% (!) 89% 90%  Weight:    59.2 kg (130 lb 8.2 oz)  Height:    5\' 10"  (1.778 m)    Intake/Output Summary (Last 24 hours) at 12/30/2016 1145 Last data filed at 12/30/2016 0855 Gross per 24 hour  Intake 0 ml  Output 50 ml  Net -50 ml   Filed Weights   12/30/16 0351  Weight: 59.2 kg (130 lb 8.2 oz)    Examination:  General exam: In mittens Respiratory system: Clear to auscultation. Anterior Lung fields.  No wheezing, no crackles, no rhonchi. Respiratory effort normal. Cardiovascular system: S1 & S2 heard, RRR. No JVD, murmurs, rubs, gallops or clicks. No pedal edema. Gastrointestinal system: Abdomen is nondistended, soft and nontender. No organomegaly or masses felt. Normal bowel sounds heard. Central nervous system: Alert and oriented. No focal neurological deficits. Extremities: Symmetric 5 x 5 power. Skin:  No rashes, lesions or ulcers Psychiatry: Judgement and insight appear poor. Mood & affect appropriate.     Data Reviewed: I have personally reviewed following labs and imaging studies  CBC: Recent Labs  Lab 12/29/16 2243 12/30/16 0611 12/30/16 0901  WBC 3.8* QUESTIONABLE RESULTS, RECOMMEND RECOLLECT TO VERIFY 13.8*  NEUTROABS 3.5 QUESTIONABLE RESULTS,  RECOMMEND RECOLLECT TO VERIFY 12.5*  HGB 18.6* QUESTIONABLE RESULTS, RECOMMEND RECOLLECT TO VERIFY 10.6*  HCT 53.4* QUESTIONABLE RESULTS, RECOMMEND RECOLLECT TO VERIFY 32.1*  MCV 94.8 QUESTIONABLE RESULTS, RECOMMEND RECOLLECT TO VERIFY 96.7  PLT 126* QUESTIONABLE RESULTS, RECOMMEND RECOLLECT TO VERIFY 227   Basic Metabolic Panel: Recent Labs  Lab 12/29/16 2243 12/30/16 0611 12/30/16 0901  NA 139 QUESTIONABLE RESULTS, RECOMMEND RECOLLECT TO VERIFY 138  K 3.4* QUESTIONABLE RESULTS, RECOMMEND RECOLLECT TO VERIFY 4.2  CL 103 QUESTIONABLE RESULTS, RECOMMEND RECOLLECT TO VERIFY 104  CO2 27 QUESTIONABLE RESULTS, RECOMMEND RECOLLECT TO VERIFY 28  GLUCOSE 111* QUESTIONABLE RESULTS, RECOMMEND RECOLLECT TO VERIFY 125*  BUN 34* QUESTIONABLE RESULTS, RECOMMEND RECOLLECT TO VERIFY 33*  CREATININE 1.05 QUESTIONABLE RESULTS, RECOMMEND RECOLLECT TO VERIFY 1.07  CALCIUM 9.2 QUESTIONABLE RESULTS, RECOMMEND RECOLLECT TO VERIFY 8.5*   GFR: Estimated Creatinine Clearance: 40 mL/min (by C-G formula based on SCr of 1.07 mg/dL). Liver Function Tests: No results for input(s): AST, ALT, ALKPHOS, BILITOT, PROT, ALBUMIN in the last 168 hours. No results for input(s): LIPASE, AMYLASE in the last 168 hours. No results for input(s): AMMONIA in the last 168 hours. Coagulation Profile: Recent Labs  Lab 12/29/16 2243  INR 1.16   Cardiac Enzymes: No results for input(s): CKTOTAL, CKMB, CKMBINDEX, TROPONINI in the last 168 hours. BNP (last 3 results) No results for input(s): PROBNP in the last 8760 hours. HbA1C: No results for input(s): HGBA1C in the last 72 hours. CBG: No results for input(s): GLUCAP in the last 168 hours. Lipid Profile: No results for input(s): CHOL, HDL, LDLCALC, TRIG, CHOLHDL, LDLDIRECT in the last 72 hours. Thyroid Function Tests: No results for input(s): TSH, T4TOTAL, FREET4, T3FREE, THYROIDAB in the last 72 hours. Anemia Panel: No results for input(s): VITAMINB12, FOLATE, FERRITIN,  TIBC, IRON, RETICCTPCT in the last 72 hours. Sepsis Labs: No results for input(s): PROCALCITON, LATICACIDVEN in the last 168 hours.  Recent Results (from the past 240 hour(s))  Surgical PCR screen     Status: None   Collection Time: 12/30/16  4:51 AM  Result Value Ref Range Status   MRSA, PCR NEGATIVE NEGATIVE Final   Staphylococcus aureus NEGATIVE NEGATIVE Final    Comment: (NOTE) The Xpert SA Assay (FDA approved for NASAL specimens in patients 19 years of age and older), is one component of a comprehensive surveillance program. It is not intended to diagnose infection nor to guide or monitor treatment.          Radiology Studies: Dg Chest 1 View  Result Date: 12/30/2016 CLINICAL DATA:  Initial evaluation for preoperative evaluation, left hip fracture. EXAM: CHEST 1 VIEW COMPARISON:  Prior radiograph from 04/28/2016. FINDINGS: Mild cardiomegaly, stable. Mediastinal silhouette within normal limits. Aortic atherosclerosis. Lungs hypoinflated. No focal infiltrates. No pulmonary edema or pleural effusion. No pneumothorax. No acute osseous abnormality. IMPRESSION: 1. Shallow lung inflation with no active cardiopulmonary disease identified. 2. Mild cardiomegaly, stable. Electronically Signed   By: Rise Mu M.D.   On: 12/30/2016 00:10   Dg Knee Left Port  Result Date: 12/30/2016 CLINICAL DATA:  Fall yesterday.  Left knee pain.  Initial encounter. EXAM: PORTABLE LEFT KNEE - 1-2 VIEW COMPARISON:  None. FINDINGS: No evidence of fracture, dislocation, or joint effusion. 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 Mild degenerative spurring of patella. Mild chondrocalcinosis also noted. Peripheral vascular calcification. IMPRESSION: No acute findings. Severe medial compartment osteoarthritis, with tibia varus. Electronically Signed   By: Myles RosenthalJohn  Stahl M.D.   On: 12/30/2016 08:49   Dg Hip Unilat With Pelvis 2-3 Views Left  Result Date: 12020/12/116 CLINICAL DATA:  Left  hip pain after a fall this evening. EXAM: DG HIP (WITH OR WITHOUT PELVIS) 2-3V LEFT COMPARISON:  None. FINDINGS: Transverse fracture of the left femoral neck with superior subluxation of the distal fracture fragment resulting in varus angulation. No dislocation. No definite inter trochanteric extension although rotation limits evaluation. Pelvis appears intact. SI joints and symphysis pubis are not displaced. Degenerative changes in the lower lumbar spine. IMPRESSION: Transverse fracture left femoral neck with varus angulation. No definite trochanteric extension although rotation limits evaluation. Electronically Signed   By: Burman NievesWilliam  Stevens M.D.   On: 12020/12/116 22:23        Scheduled Meds: . atorvastatin  10 mg Oral q1800  . busPIRone  7.5 mg Oral BID  . [START ON 12/31/2016] docusate sodium  200 mg Oral QPC breakfast  . [START ON 12/31/2016] feeding supplement (PRO-STAT SUGAR FREE 64)  30 mL Oral BID  . levothyroxine  50 mcg Oral QAC breakfast  . mirtazapine  15 mg Oral QHS  . sertraline  25 mg Oral Daily   Continuous Infusions: . sodium chloride    .  ceFAZolin (ANCEF) IV    . cefTRIAXone (ROCEPHIN)  IV    . tranexamic acid       LOS: 0 days    Time spent: 35 mins    Ramiro Harvestaniel Thompson, MD [facility] Pager [phone]   If 7PM-7AM, please contact night-coverage [email or web address] Password [email or web address] 12/30/2016, 11:45 AM

## 2016-12-30 NOTE — ED Notes (Signed)
Report given to Joanne RN

## 2016-12-30 NOTE — Progress Notes (Signed)
Patient has hip surgery - left side today; report was given to OR RN; patient went to OR at 1145

## 2016-12-30 NOTE — Op Note (Signed)
OPERATIVE REPORT  SURGEON: Samson FredericBrian Riyanna Crutchley, MD   ASSISTANT: Staff.  PREOPERATIVE DIAGNOSIS: Displaced Left femoral neck fracture.   POSTOPERATIVE DIAGNOSIS: Displaced Left femoral neck fracture.   PROCEDURE: Left hip hemiarthroplasty, anterior approach.   IMPLANTS: DePuy Tri Lock stem, size 6, std offset, with a -3 mm spacer and a 54 mm monopolar head ball.  ANESTHESIA:  General  ANTIBIOTICS: 2g ancef.  ESTIMATED BLOOD LOSS:-300 mL    DRAINS: None.  COMPLICATIONS: None   CONDITION: PACU - hemodynamically stable.   BRIEF CLINICAL NOTE: Benjamin Banks is a 83 y.o. male with a displaced Left femoral neck fracture. The patient was admitted to the hospitalist service and underwent perioperative risk stratification and medical optimization. The risks, benefits, and alternatives to hemiarthroplasty were explained, and the patient elected to proceed.  PROCEDURE IN DETAIL: The patient was taken to the operating room and general anesthesia was induced on the hospital bed. The patient was then positioned on the Hana table. All bony prominences were well padded. The hip was prepped and draped in the normal sterile surgical fashion. A time-out was called verifying side and site of surgery. Antibiotics were given within 60 minutes of beginning the procedure.  The direct anterior approach to the hip was performed through the Hueter interval. Lateral femoral circumflex vessels were treated with the Auqumantys. The anterior capsule was exposed and an inverted T capsulotomy was made. Fracture hematoma was encountered and evacuated. The patient was found to have a comminuted Left subcapital femoral neck fracture. I freshened the femoral neck cut with a saw. I removed the femoral neck fragment. A corkscrew was placed into the head and the head was removed. This was passed to the back table and was measured.  Acetabular exposure was achieved. I examined the articular cartilage which  was intact. The labrum was intact. A 54 mm trial head was placed and found to have excellent fit.  I then gained femoral exposure taking care to protect the abductors and greater trochanter. This was performed using standard external rotation, extension, and adduction. The capsule was peeled off the inner aspect of the greater trochanter, taking care to preserve the short external rotators. A cookie cutter was used to enter the femoral canal, and then the femoral canal finder was used to confirm location. I then sequentially broached up to a size 6. Calcar planer was used on the femoral neck remnant. I paced a std neck and a 36+ 1.5 head ball.The hip was reduced. Leg lengths were checked fluoroscopically. The hip was dislocated and trial components were removed. I placed the real stem followed by the real spacer and head ball. A single reduction maneuver was performed and the hip was reduced. Fluoroscopy was used to confirm component position and leg lengths. At 90 degrees of external rotation and extension, the hip was stable to an anterior directed force.  The wound was copiously irrigated with normal saline solution. Marcaine solution was injected into the periarticular soft tissue. The wound was closed in layers using #1 Vicryl and V-Loc for the fascia, 2-0 Vicryl for the subcutaneous fat, 2-0 Monocryl for the deep dermal layer, 3-0 running Monocryl subcuticular stitch and glue for the skin. Once the glue was fully dried, an Aquacell Ag dressing was applied. The patient was then awakened from anesthesia and transported to the recovery room in stable condition. Sponge, needle, and instrument counts were correct at the end of the case x2. The patient tolerated the procedure well and there were no known  complications.  Postoperatively, we will readmit him to the hospitalist.  He may weight-bear as tolerated with a walker.  We will start aspirin 81 mg p.o. twice daily for DVT prophylaxis  due to the risk of bleeding and falls. He will work with physical and occupational therapy and undergo disposition planning. Unfortunately, his dementia is likely to complicate the postoperative course.

## 2016-12-30 NOTE — Care Management Note (Signed)
Case Management Note  Patient Details  Name: Benjamin Banks MRN: [id number] Date of Birth: [birth year]  Subjective/Objective:                  Left hip fracture  Action/Plan: Date: December 30, 2016 Benjamin Banks, BSN, [place]3, [place]  [phone] Chart and notes review for patient progress and needs. Will follow for case management and discharge needs. Next review date: [id number]  Expected Discharge Date:                  Expected Discharge Plan:  Home/Self Care  In-House Referral:     Discharge planning Services  CM Consult  Post Acute Care Choice:    Choice offered to:     DME Arranged:    DME Agency:     HH Arranged:    HH Agency:     Status of Service:  In process, will continue to follow  If discussed at Long Length of Stay Meetings, dates discussed:    Additional Comments:  Benjamin Banks, Benjamin Mottola Lynn, RN 12/30/2016, 8:36 AM

## 2016-12-30 NOTE — Transfer of Care (Signed)
Immediate Anesthesia Transfer of Care Note  Patient: Benjamin Banks  Procedure(s) Performed: LEFT HIP HEMIARTHROPLASTY  ANTERIOR APPROACH (Left Hip)  Patient Location: PACU  Anesthesia Type:General  Level of Consciousness: drowsy and patient cooperative  Airway & Oxygen Therapy: Patient Spontanous Breathing and Patient connected to face mask oxygen  Post-op Assessment: Report given to RN and Post -op Vital signs reviewed and stable  Post vital signs: Reviewed and stable  Last Vitals:  Vitals:   12/30/16 0351 12/30/16 1532  BP: 111/60 (P) 108/61  Pulse: 74 (P) 64  Resp: 18 (P) 12  Temp: 36.6 C 36.6 C  SpO2: 90% (P) 98%    Last Pain:  Vitals:   12/30/16 1532  TempSrc:   PainSc: (P) Asleep      Patients Stated Pain Goal: 0 (12/30/16 1044)  Complications: No apparent anesthesia complications

## 2016-12-31 ENCOUNTER — Encounter (HOSPITAL_COMMUNITY): Payer: Self-pay | Admitting: Orthopedic Surgery

## 2016-12-31 LAB — CBC WITH DIFFERENTIAL/PLATELET
Basophils Absolute: 0 10*3/uL (ref 0.0–0.1)
Basophils Relative: 0 %
EOS PCT: 1 %
Eosinophils Absolute: 0.1 10*3/uL (ref 0.0–0.7)
HEMATOCRIT: 28.9 % — AB (ref 39.0–52.0)
Hemoglobin: 9.5 g/dL — ABNORMAL LOW (ref 13.0–17.0)
LYMPHS ABS: 0.9 10*3/uL (ref 0.7–4.0)
LYMPHS PCT: 9 %
MCH: 32.2 pg (ref 26.0–34.0)
MCHC: 32.9 g/dL (ref 30.0–36.0)
MCV: 98 fL (ref 78.0–100.0)
MONOS PCT: 7 %
Monocytes Absolute: 0.7 10*3/uL (ref 0.1–1.0)
NEUTROS ABS: 8.3 10*3/uL — AB (ref 1.7–7.7)
Neutrophils Relative %: 83 %
PLATELETS: 195 10*3/uL (ref 150–400)
RBC: 2.95 MIL/uL — ABNORMAL LOW (ref 4.22–5.81)
RDW: 14.4 % (ref 11.5–15.5)
WBC: 9.9 10*3/uL (ref 4.0–10.5)

## 2016-12-31 LAB — IRON AND TIBC
Iron: 18 ug/dL — ABNORMAL LOW (ref 45–182)
SATURATION RATIOS: 8 % — AB (ref 17.9–39.5)
TIBC: 214 ug/dL — ABNORMAL LOW (ref 250–450)
UIBC: 196 ug/dL

## 2016-12-31 LAB — BASIC METABOLIC PANEL
Anion gap: 4 — ABNORMAL LOW (ref 5–15)
BUN: 32 mg/dL — AB (ref 6–20)
CALCIUM: 8.1 mg/dL — AB (ref 8.9–10.3)
CO2: 27 mmol/L (ref 22–32)
Chloride: 107 mmol/L (ref 101–111)
Creatinine, Ser: 0.91 mg/dL (ref 0.61–1.24)
GFR calc Af Amer: >60 mL/min (ref >60)
GLUCOSE: 102 mg/dL — AB (ref 65–99)
Potassium: 3.8 mmol/L (ref 3.5–5.1)
Sodium: 138 mmol/L (ref 135–145)

## 2016-12-31 LAB — MAGNESIUM: Magnesium: 2 mg/dL (ref 1.7–2.4)

## 2016-12-31 LAB — VITAMIN B12: Vitamin B-12: 324 pg/mL (ref 180–914)

## 2016-12-31 LAB — RETICULOCYTES
RBC.: 2.95 MIL/uL — ABNORMAL LOW (ref 4.22–5.81)
Retic Count, Absolute: 32.5 10*3/uL (ref 19.0–186.0)
Retic Ct Pct: 1.1 % (ref 0.4–3.1)

## 2016-12-31 LAB — FOLATE: FOLATE: 20.9 ng/mL (ref >5.9)

## 2016-12-31 LAB — FERRITIN: FERRITIN: 103 ng/mL (ref 24–336)

## 2016-12-31 MED ORDER — ASPIRIN 81 MG PO TABS: 81.0000 mg | ORAL_TABLET | Freq: Two times a day (BID) | ORAL | 1 refills | Status: DC

## 2016-12-31 MED ORDER — BOOST / RESOURCE BREEZE PO LIQD: 1.0000 | Freq: Three times a day (TID) | ORAL | Status: DC

## 2016-12-31 MED ORDER — LORAZEPAM 2 MG/ML IJ SOLN
0.5000 mg | Freq: Four times a day (QID) | INTRAMUSCULAR | Status: DC | PRN
  Administered 2016-12-31 – 2017-01-02 (×5): 0.5 mg via INTRAVENOUS
  Filled 2016-12-31 (×5): qty 1

## 2016-12-31 MED ORDER — BOOST / RESOURCE BREEZE PO LIQD
1.0000 | Freq: Two times a day (BID) | ORAL | Status: DC
  Administered 2017-01-01 – 2017-01-06 (×3): 1 via ORAL

## 2016-12-31 MED ORDER — ADULT MULTIVITAMIN W/MINERALS CH
1.0000 | ORAL_TABLET | Freq: Every day | ORAL | Status: DC
  Administered 2017-01-01 – 2017-01-09 (×9): 1 via ORAL
  Filled 2016-12-31 (×10): qty 1

## 2016-12-31 MED ORDER — SODIUM CHLORIDE 0.9 % IV SOLN
INTRAVENOUS | Status: AC
  Administered 2016-12-31: 10:00:00 via INTRAVENOUS

## 2016-12-31 MED ORDER — HYDROCODONE-ACETAMINOPHEN 5-325 MG PO TABS: 1.0000 | ORAL_TABLET | Freq: Four times a day (QID) | ORAL | 0 refills | Status: DC | PRN

## 2016-12-31 NOTE — Progress Notes (Signed)
   Subjective:  Patient reports pain as mild to moderate.  No c/o.  Objective:   VITALS:   Vitals:   12/30/16 1752 12/30/16 1859 12/30/16 1947 12/31/16 0053  BP: 121/63 130/64 118/62 (!) 92/54  Pulse: 78 (!) 58 (!) 54 (!) 49  Resp:   (!) 9 (!) 9  Temp: 97.7 F (36.5 C) 97.7 F (36.5 C) 98.2 F (36.8 C) (!) 97.4 F (36.3 C)  TempSrc: Oral Axillary Oral Axillary  SpO2: 97% 93% 100% 97%  Weight:      Height:        NAD ABD soft Intact pulses distally Dorsiflexion/Plantar flexion intact Incision: dressing C/D/I Compartment soft   Lab Results  Component Value Date   WBC 13.8 (H) 12/30/2016   HGB 10.6 (L) 12/30/2016   HCT 32.1 (L) 12/30/2016   MCV 96.7 12/30/2016   PLT 227 12/30/2016   BMET    Component Value Date/Time   NA 138 12/30/2016 0901   K 4.2 12/30/2016 0901   CL 104 12/30/2016 0901   CO2 28 12/30/2016 0901   GLUCOSE 125 (H) 12/30/2016 0901   BUN 33 (H) 12/30/2016 0901   CREATININE 1.07 12/30/2016 0901   CALCIUM 8.5 (L) 12/30/2016 0901   GFRNONAA 60 (L) 12/30/2016 0901   GFRAA >60 12/30/2016 0901     Assessment/Plan: 1 Day Post-Op   Principal Problem:   Closed left hip fracture, initial encounter (HCC) Active Problems:   Essential hypertension   Dementia   Depression   Hypothyroidism   Leukopenia   Thrombocytopenia (HCC)   Hypoxia   Acute lower UTI   Displaced fracture of left femoral neck (HCC)   WBAT with walker DVT ppx: ASA 81 mg PO BID, SCDs, TEDs PO pain control PT/OT Dispo: d/c to SNF vs memory care depending on progress   Jonette PesaBrian J Cannie Muckle 12/31/2016, 5:17 AM   Samson FredericBrian Graclyn Lawther, MD Cell [phone]

## 2016-12-31 NOTE — Evaluation (Signed)
Physical Therapy Evaluation Patient Details Name: Benjamin Banks MRN: [id number] DOB: [birth year] Today's Date: 12/31/2016   History of Present Illness  83 yo male admitted after sustaining a fall at home. S/P L hip hemiarthroplasty-anterior approach-no precautions. Hx of dementia, neuropathy, BPPV, polymyalgia rheumatica, anemia, frequent falls.     Clinical Impression  On eval, pt required Min assist +2 for safety for mobility. He walked ~50 feet with a RW. Pt presents with general weakness, decreased activity tolerance, and impaired gait and balance. He followed 1 step commands and participated fairly well. No family was present during session. Unsure of where pt lived prior to admission. Recommend SNF unless previous facility can provide current level of care.     Follow Up Recommendations SNF; 24 hour Supervision/Assist    Equipment Recommendations  None recommended by PT    Recommendations for Other Services       Precautions / Restrictions Precautions Precautions: Fall Restrictions Weight Bearing Restrictions: No      Mobility  Bed Mobility Overal bed mobility: Needs Assistance Bed Mobility: Supine to Sit;Sit to Supine     Supine to sit: Min assist;HOB elevated Sit to supine: Min assist;HOB elevated   General bed mobility comments: Assist for trunk and LEs. Increased time. Multimodal cues.   Transfers Overall transfer level: Needs assistance Equipment used: Rolling walker (2 wheeled) Transfers: Sit to/from Stand Sit to Stand: Min assist;+2 safety/equipment         General transfer comment: Assist to rise, stabilize, control descent. Multimodal cues for safety, technique, hand placement.   Ambulation/Gait Ambulation/Gait assistance: +2 safety/equipment;Min assist Ambulation Distance (Feet): 50 Feet Assistive device: Rolling walker (2 wheeled) Gait Pattern/deviations: Step-through pattern;Decreased step length - left;Decreased stride length     General  Gait Details: Pt tends to allow L leg to lag behind. Multimodal cues for safety, distance from RW. Assist to stabilize pt and maneuver safely with RW. Pt also tends to veer to R repeatedly, requiring assist to correct.   Stairs            Wheelchair Mobility    Modified Rankin (Stroke Patients Only)       Balance Overall balance assessment: Needs assistance         Standing balance support: Bilateral upper extremity supported Standing balance-Leahy Scale: Poor                               Pertinent Vitals/Pain Pain Assessment: Faces Faces Pain Scale: Hurts little more Pain Location: both hips and knees Pain Descriptors / Indicators: Discomfort Pain Intervention(s): Monitored during session;Repositioned    Home Living Family/patient expects to be discharged to:: Unsure                 Additional Comments: Pt is a poor historian and no family present to provide info    Prior Function           Comments: per chart, pt uses a walker at baseline.      Hand Dominance        Extremity/Trunk Assessment   Upper Extremity Assessment Upper Extremity Assessment: Defer to OT evaluation    Lower Extremity Assessment Lower Extremity Assessment: Generalized weakness    Cervical / Trunk Assessment Cervical / Trunk Assessment: Kyphotic  Communication   Communication: No difficulties  Cognition Arousal/Alertness: Awake/alert Behavior During Therapy: WFL for tasks assessed/performed Overall Cognitive Status: History of cognitive impairments - at baseline  General Comments      Exercises     Assessment/Plan    PT Assessment Patient needs continued PT services  PT Problem List Decreased strength;Decreased balance;Decreased cognition;Decreased activity tolerance;Decreased safety awareness;Pain;Decreased knowledge of use of DME;Decreased mobility       PT Treatment Interventions DME  instruction;Gait training;Functional mobility training;Therapeutic activities;Balance training;Patient/family education;Therapeutic exercise    PT Goals (Current goals can be found in the Care Plan section)  Acute Rehab PT Goals Patient Stated Goal: none stated PT Goal Formulation: Patient unable to participate in goal setting Time For Goal Achievement: 01/14/17 Potential to Achieve Goals: Good    Frequency Min 3X/week   Barriers to discharge        Co-evaluation               AM-PAC PT "6 Clicks" Daily Activity  Outcome Measure Difficulty turning over in bed (including adjusting bedclothes, sheets and blankets)?: Unable Difficulty moving from lying on back to sitting on the side of the bed? : Unable Difficulty sitting down on and standing up from a chair with arms (e.g., wheelchair, bedside commode, etc,.)?: Unable Help needed moving to and from a bed to chair (including a wheelchair)?: A Little Help needed walking in hospital room?: A Lot Help needed climbing 3-5 steps with a railing? : A Lot 6 Click Score: 10    End of Session Equipment Utilized During Treatment: Gait belt Activity Tolerance: Patient tolerated treatment well Patient left: in bed;with bed alarm set;with call bell/phone within reach   PT Visit Diagnosis: Muscle weakness (generalized) (M62.81);Difficulty in walking, not elsewhere classified (R26.2);Pain Pain - Right/Left: (both) Pain - part of body: Hip;Knee    Time: [id number] PT Time Calculation (min) (ACUTE ONLY): 28 min   Charges:   PT Evaluation $PT Eval Moderate Complexity: 1 Mod     PT G Codes:          Benjamin Banks, MPT Pager: [phone]

## 2016-12-31 NOTE — Clinical Social Work Note (Signed)
Clinical Social Work Assessment  Patient Details  Name: Benjamin Banks MRN: [id number] Date of Birth: [birth year]  Date of referral:  12/30/16               Reason for consult:  Facility Placement(Patient froom [facility] Age)                Permission sought to share information with:  Case Manager, Customer service manager, Family Supports Permission granted to share information::  Yes, Verbal Permission Granted(Permission from [place])  Name::     Raylene Miyamoto POA,  and Gaylyn Lambert  Agency::  [facility]  Relationship::  [place] and [employer] Information:  Bethena Roys [phone]  Housing/Transportation Living arrangements for the past 2 months:  Assisted Living Facility(Memory Care) Source of Information:  Power of Attorney Patient Interpreter Needed:  None Criminal Activity/Legal Involvement Pertinent to Current Situation/Hospitalization:  No - Comment as needed Significant Relationships:  Other Family Members, Community Support Lives with:  Facility Resident Do you feel safe going back to the place where you live?  Yes Need for family participation in patient care:  Yes (Comment)  Care giving concerns:  Family asked questions regarding facilities ability to provide care after surgery. LCSW explained that recommendations for care at dc will be determined once PT evaluates post op.    Social Worker assessment / plan:  LCSW consulted for return to facility.  Patient was admitted to the hospital for fractured hip due to a fall. Patient is scheduled to have surgery.  Patient is an 83 year old male with a history of dementia, hypothyroidism, hypertension, and chronic iron deficiency anemia  LCSW met bedside with sister in [place]. POA reports that patient has been living at [facility] in their memory care unit since May of this year. Mrs. Quentin Ore reports that patient uses a walker at the facility.  Family reports  that they are satisfied with care at the facility.  Patient was asleep during assessment. Konrad Dolores, nephew arrived at the end of assessment when patient was being picked up for surgery.   According to [facility] at [facility] patient has a walker but did not use it. She reports that patient ambulates by himself but uses the railings on the walls for assistance. She reports that patient is familiar with facility and has no trouble getting around. Facility reports that patient is on a regular diet at the facility and has healthy shakes after meals.   PLAN: TBD after PT recs.   Employment status:  Retired Forensic scientist:  Medicare PT Recommendations:  Not assessed at this time Information / Referral to community resources:     Patient/Family's Response to care:  Family was inquisitive and appreciative of LCSW visit.  Patient/Family's Understanding of and Emotional Response to Diagnosis, Current Treatment, and Prognosis:  Family is aware of patients current diagnosis and agreeable to current treatment plan (surgery). Family verbalized concerns that patient may need higher level of care than what facility can provide at dc.   Emotional Assessment Appearance:  Appears younger than stated age Attitude/Demeanor/Rapport:  Unable to Assess Affect (typically observed):  Unable to Assess(Patient was alseep) Orientation:  Oriented to Self Alcohol / Substance use:  Not Applicable Psych involvement (Current and /or in the community):  No (Comment)  Discharge Needs  Concerns to be addressed:  Care Coordination Readmission within the last 30 days:  No Current discharge risk:  None Barriers to Discharge:  Continued Medical Work up  Servando Snare, LCSW 12/31/2016, 9:40 AM

## 2016-12-31 NOTE — Progress Notes (Signed)
PROGRESS NOTE    Benjamin Banks  WJX:[id number] DOB: 07-07-1925 DOA: 113-Jan-202018 PCP: Merlene Laughter, MD    Brief Narrative: 83-year-old male with dementia hypertension, hypothyroidism, chronic iron deficiency anemia admitted from SNF following a mechanical fall and left hip pain noted to have displaced femoral neck fracture on the left, seen by orthopedics, underwent left hip hemiarthroplasty 11/21 postoperatively confused and agitated required Ativan this morning. Also remains on IV ceftriaxone for possible urinary tract infection   Assessment & Plan:   #1 closed displaced femoral neck fracture left hip -Secondary to mechanical fall  -Status post left hip hemiarthroplasty 11/21 by Dr. Samson Frederic -Postoperatively on aspirin 81 mg twice a day per orthopedics for DVT prophylaxis  -Will need SNF for rehabilitation when stable  -PT eval today -Resume diet when more awake  2.  Hypertension -HCTZ on hold, continue hydralazine when necessary  3.  Hypothyroidism Continue home dose Synthroid.  4.  Acute UTI -Continue IV ceftriaxone, urine cultures with 100,000 colonies of gram-negative rods will follow  5.  Dementia -With intermittent delirium, agitation -PRN Ativan low-dose as needed  6. Chronic Iron defi anemia -With postop blood loss, monitor  DVT prophylaxis:  Aspirin 81 mg twice a day Per orthopedics Code Status: DNR Family Communication: No family at bedside. Disposition Plan: Likely skilled nursing facility  in 1-2 days if stable   Consultants:   Orthopedics: Dr. Ranell Patrick 113-Jan-202018  Procedures:   -Left hip hemiarthroplasty 11/20   Antimicrobials:   IV Rocephin 12/30/2016   Subjective: -Was agitated and getting out of bed this morning, got a dose of IV Ativan  Objective: Vitals:   12/30/16 1859 12/30/16 1947 12/31/16 0053 12/31/16 0547  BP: 130/64 118/62 (!) 92/54 129/63  Pulse: (!) 58 (!) 54 (!) 49 80  Resp:  (!) 9 (!) 9 (!) 9  Temp: 97.7 F (36.5  C) 98.2 F (36.8 C) (!) 97.4 F (36.3 C) 97.6 F (36.4 C)  TempSrc: Axillary Oral Axillary Oral  SpO2: 93% 100% 97% (!) 88%  Weight:      Height:        Intake/Output Summary (Last 24 hours) at 12/31/2016 1208 Last data filed at 12/31/2016 0600 Gross per 24 hour  Intake 3122.5 ml  Output 700 ml  Net 2422.5 ml   Filed Weights   12/30/16 0351  Weight: 59.2 kg (130 lb 8.2 oz)    Examination:  General exam: Somnolent but arousable, pleasantly confused, no distress Respiratory system: Clear anteriorly Cardiovascular system: S1 & S2 heard, RRR.  Gastrointestinal system: Abdomen is nondistended, soft and nontender. No organomegaly or masses felt. Normal bowel sounds heard. Central nervous system: Confused, nonfocal Extremities: No edema Skin: No rashes, lesions or ulcers Psychiatry: Confused, unable to assess    Data Reviewed: I have personally reviewed following labs and imaging studies  CBC: Recent Labs  Lab 12/29/16 2243 12/30/16 0611 12/30/16 0901 12/31/16 0602  WBC 3.8* QUESTIONABLE RESULTS, RECOMMEND RECOLLECT TO VERIFY 13.8* 9.9  NEUTROABS 3.5 QUESTIONABLE RESULTS, RECOMMEND RECOLLECT TO VERIFY 12.5* 8.3*  HGB 18.6* QUESTIONABLE RESULTS, RECOMMEND RECOLLECT TO VERIFY 10.6* 9.5*  HCT 53.4* QUESTIONABLE RESULTS, RECOMMEND RECOLLECT TO VERIFY 32.1* 28.9*  MCV 94.8 QUESTIONABLE RESULTS, RECOMMEND RECOLLECT TO VERIFY 96.7 98.0  PLT 126* QUESTIONABLE RESULTS, RECOMMEND RECOLLECT TO VERIFY 227 195   Basic Metabolic Panel: Recent Labs  Lab 12/29/16 2243 12/30/16 0611 12/30/16 0901 12/31/16 0602  NA 139 QUESTIONABLE RESULTS, RECOMMEND RECOLLECT TO VERIFY 138 138  K 3.4* QUESTIONABLE RESULTS, RECOMMEND RECOLLECT  TO VERIFY 4.2 3.8  CL 103 QUESTIONABLE RESULTS, RECOMMEND RECOLLECT TO VERIFY 104 107  CO2 27 QUESTIONABLE RESULTS, RECOMMEND RECOLLECT TO VERIFY 28 27  GLUCOSE 111* QUESTIONABLE RESULTS, RECOMMEND RECOLLECT TO VERIFY 125* 102*  BUN 34* QUESTIONABLE  RESULTS, RECOMMEND RECOLLECT TO VERIFY 33* 32*  CREATININE 1.05 QUESTIONABLE RESULTS, RECOMMEND RECOLLECT TO VERIFY 1.07 0.91  CALCIUM 9.2 QUESTIONABLE RESULTS, RECOMMEND RECOLLECT TO VERIFY 8.5* 8.1*  MG  --   --   --  2.0   GFR: Estimated Creatinine Clearance: 47 mL/min (by C-G formula based on SCr of 0.91 mg/dL). Liver Function Tests: No results for input(s): AST, ALT, ALKPHOS, BILITOT, PROT, ALBUMIN in the last 168 hours. No results for input(s): LIPASE, AMYLASE in the last 168 hours. No results for input(s): AMMONIA in the last 168 hours. Coagulation Profile: Recent Labs  Lab 12/29/16 2243  INR 1.16   Cardiac Enzymes: No results for input(s): CKTOTAL, CKMB, CKMBINDEX, TROPONINI in the last 168 hours. BNP (last 3 results) No results for input(s): PROBNP in the last 8760 hours. HbA1C: No results for input(s): HGBA1C in the last 72 hours. CBG: No results for input(s): GLUCAP in the last 168 hours. Lipid Profile: No results for input(s): CHOL, HDL, LDLCALC, TRIG, CHOLHDL, LDLDIRECT in the last 72 hours. Thyroid Function Tests: No results for input(s): TSH, T4TOTAL, FREET4, T3FREE, THYROIDAB in the last 72 hours. Anemia Panel: Recent Labs    12/31/16 0602  VITAMINB12 324  FOLATE 20.9  FERRITIN 103  TIBC 214*  IRON 18*  RETICCTPCT 1.1   Sepsis Labs: No results for input(s): PROCALCITON, LATICACIDVEN in the last 168 hours.  Recent Results (from the past 240 hour(s))  Surgical PCR screen     Status: None   Collection Time: 12/30/16  4:51 AM  Result Value Ref Range Status   MRSA, PCR NEGATIVE NEGATIVE Final   Staphylococcus aureus NEGATIVE NEGATIVE Final    Comment: (NOTE) The Xpert SA Assay (FDA approved for NASAL specimens in patients 422 years of age and older), is one component of a comprehensive surveillance program. It is not intended to diagnose infection nor to guide or monitor treatment.   Culture, Urine     Status: Abnormal (Preliminary result)    Collection Time: 12/30/16  8:51 AM  Result Value Ref Range Status   Specimen Description URINE, CLEAN CATCH  Final   Special Requests NONE  Final   Culture >=100,000 COLONIES/mL ESCHERICHIA COLI (A)  Final   Report Status PENDING  Incomplete         Radiology Studies: Dg Chest 1 View  Result Date: 12/30/2016 CLINICAL DATA:  Initial evaluation for preoperative evaluation, left hip fracture. EXAM: CHEST 1 VIEW COMPARISON:  Prior radiograph from 04/28/2016. FINDINGS: Mild cardiomegaly, stable. Mediastinal silhouette within normal limits. Aortic atherosclerosis. Lungs hypoinflated. No focal infiltrates. No pulmonary edema or pleural effusion. No pneumothorax. No acute osseous abnormality. IMPRESSION: 1. Shallow lung inflation with no active cardiopulmonary disease identified. 2. Mild cardiomegaly, stable. Electronically Signed   By: Rise MuBenjamin  McClintock M.D.   On: 12/30/2016 00:10   Pelvis Portable  Result Date: 12/30/2016 CLINICAL DATA:  Post LEFT hip arthroplasty EXAM: PORTABLE PELVIS 1-2 VIEWS COMPARISON:  Portable exam 1553 hours compared to intraoperative images of 12/30/2016 FINDINGS: LEFT hip prosthesis identified in expected position. No definite fracture or dislocation. Bones appear demineralized. Scattered soft tissue gas. Catheter within urinary bladder. IMPRESSION: LEFT hip prosthesis without acute complication. Electronically Signed   By: Ulyses SouthwardMark  Boles M.D.   On: 12/30/2016  16:20   Dg Knee Left Port  Result Date: 12/30/2016 CLINICAL DATA:  Fall yesterday.  Left knee pain.  Initial encounter. EXAM: PORTABLE LEFT KNEE - 1-2 VIEW COMPARISON:  None. FINDINGS: No evidence of fracture, dislocation, or joint effusion. 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 Mild degenerative spurring of patella. Mild chondrocalcinosis also noted. Peripheral vascular calcification. IMPRESSION: No acute findings. Severe medial compartment osteoarthritis, with tibia varus. Electronically  Signed   By: Myles RosenthalJohn  Stahl M.D.   On: 12/30/2016 08:49   Dg C-arm 61-120 Min-no Report  Result Date: 12/30/2016 Fluoroscopy was utilized by the requesting physician.  No radiographic interpretation.   Dg Hip Operative Unilat W Or W/o Pelvis Left  Result Date: 12/30/2016 CLINICAL DATA:  Status post total hip replacement on the left EXAM: OPERATIVE LEFT HIP 1 VIEW TECHNIQUE: Fluoroscopic spot image(s) were submitted for interpretation post-operatively. FLUOROSCOPY TIME:  0 minutes 18 seconds; 1.75 mGy; 2 acquired images COMPARISON:  December 29, 2016 FINDINGS: There is a total hip replacement on the left with prosthetic components well-seated. No fracture or dislocation. Visualized right hip joint appears normal. IMPRESSION: Prosthetic components for total hip replacement on the left appear well seated. No fracture or dislocation evident. Electronically Signed   By: Bretta BangWilliam  Woodruff III M.D.   On: 12/30/2016 14:40   Dg Hip Unilat With Pelvis 2-3 Views Left  Result Date: 1Jun 05, 202018 CLINICAL DATA:  Left hip pain after a fall this evening. EXAM: DG HIP (WITH OR WITHOUT PELVIS) 2-3V LEFT COMPARISON:  None. FINDINGS: Transverse fracture of the left femoral neck with superior subluxation of the distal fracture fragment resulting in varus angulation. No dislocation. No definite inter trochanteric extension although rotation limits evaluation. Pelvis appears intact. SI joints and symphysis pubis are not displaced. Degenerative changes in the lower lumbar spine. IMPRESSION: Transverse fracture left femoral neck with varus angulation. No definite trochanteric extension although rotation limits evaluation. Electronically Signed   By: Burman NievesWilliam  Stevens M.D.   On: 1Jun 05, 202018 22:23        Scheduled Meds: . aspirin EC  81 mg Oral BID WC  . atorvastatin  10 mg Oral q1800  . busPIRone  7.5 mg Oral BID  . docusate sodium  200 mg Oral QPC breakfast  . feeding supplement  1 Container Oral BID BM  . feeding  supplement (PRO-STAT SUGAR FREE 64)  30 mL Oral BID  . levothyroxine  50 mcg Oral QAC breakfast  . mirtazapine  15 mg Oral QHS  . multivitamin with minerals  1 tablet Oral Daily  . sertraline  25 mg Oral Daily   Continuous Infusions: . sodium chloride 50 mL/hr at 12/31/16 0930  . cefTRIAXone (ROCEPHIN)  IV Stopped (12/30/16 1839)  . lactated ringers 50 mL/hr at 12/30/16 1209     LOS: 1 day    Time spent: 35 mins    Zannie CovePreetha Bathsheba Durrett, MD [facility] Page via [email or web address] [email or web address], password [email or web address]   If 7PM-7AM, please contact night-coverage [email or web address] Password [email or web address] 12/31/2016, 12:08 PM

## 2016-12-31 NOTE — Clinical Social Work Note (Deleted)
Clinical Social Work Assessment  Patient Details  Name: Benjamin Banks MRN: [id number] Date of Birth: Jan 21, 1926  Date of referral:  12/30/16               Reason for consult:  Facility Placement(Patient [facility] Age)                Permission sought to share information with:  Case Manager, Customer service manager, Family Supports Permission granted to share information::  Yes, Verbal Permission Granted(Permission from [place])  Name::     Benjamin Banks POA,  and Benjamin Banks  Agency::  [facility] Age  Relationship::  [place] and [employer] Information:  Benjamin Banks [phone]  Housing/Transportation Living arrangements for the past 2 months:  Assisted Living Facility(Memory Care) Source of Information:  Power of Attorney Patient Interpreter Needed:  None Criminal Activity/Legal Involvement Pertinent to Current Situation/Hospitalization:  No - Comment as needed Significant Relationships:  Other Family Members, Community Support Lives with:  Facility Resident Do you feel safe going back to the place where you live?  Yes Need for family participation in patient care:  Yes (Comment)  Care giving concerns:  Family expressed concerns about facility not being able to provide level of care patient may need after surgery. LCSW explained that pt/ot will make recommendation for placement at DC when patient is evaluated after surgery.    Social Worker assessment / plan:  LCSW consulted for return to facility.  Patient was admitted to the hospital for a fractured hip due to a fall.   LCSW met with sister in law and POA, Benjamin Banks at bedside. POA reports that patient has been living at [facility] since May of this year in their Memory Care unit.   POA reports that they are satisfied with level of care at [facility]. However expressed concerns about what patients needs would be post op and if the facility would be able to handle patients  recovery needs.   Nephew, Benjamin Banks arrived at the end of assessment as patient was being picked up for surgery.    PLAN: TBD after surgery and pt/ot recommendations.   Employment status:  Retired Forensic scientist:  Medicare PT Recommendations:  Not assessed at this time Information / Referral to community resources:     Patient/Family's Response to care: Family was inquisitive and thankful of [place] visit.   Patient/Family's Understanding of and Emotional Response to Diagnosis, Current Treatment, and Prognosis:  Family had a clear understanding of patients diagnosis and the need for surgery. Family verbalized that they are aware that SNF may be needed after surgery and asked questions about the process since patient is currently in ALF.   Emotional Assessment Appearance:  Appears younger than stated age Attitude/Demeanor/Rapport:  Unable to Assess Affect (typically observed):  Unable to Assess(Patient was alseep) Orientation:  Oriented to Self Alcohol / Substance use:  Not Applicable Psych involvement (Current and /or in the community):  No (Comment)  Discharge Needs  Concerns to be addressed:  Care Coordination Readmission within the last 30 days:  No Current discharge risk:  None Barriers to Discharge:  Continued Medical Work up   [employer], LCSW 12/31/2016, 9:14 AM

## 2016-12-31 NOTE — Evaluation (Signed)
Occupational Therapy Evaluation Patient Details Name: Dawna PartLardric Jr Regas MRN: [id number] DOB: 23-Nov-1925 Today's Date: 12/31/2016    History of Present Illness 83 yo male admitted after sustaining a fall at home. S/P L hip hemiarthroplasty-anterior approach-no precautions. Hx of dementia, neuropathy, BPPV, polymyalgia rheumatica, anemia, frequent falls.    Clinical Impression   Pt was admitted for the above. He is from memory care; PLOF unknown, other than that he uses a walker.  Will follow in acute setting with min guard level goals.  Pt needs min +2 safety assistance at this time    Follow Up Recommendations  SNF;Supervision/Assistance - 24 hour    Equipment Recommendations  3 in 1 bedside commode    Recommendations for Other Services       Precautions / Restrictions Precautions Precautions: Fall Restrictions Weight Bearing Restrictions: No      Mobility Bed Mobility Overal bed mobility: Needs Assistance Bed Mobility: Supine to Sit;Sit to Supine     Supine to sit: Min assist;HOB elevated Sit to supine: Min assist;HOB elevated   General bed mobility comments: Assist for trunk and LEs. Increased time. Multimodal cues.   Transfers Overall transfer level: Needs assistance Equipment used: Rolling walker (2 wheeled) Transfers: Sit to/from Stand Sit to Stand: Min assist;+2 safety/equipment         General transfer comment: Assist to rise, stabilize, control descent. Multimodal cues for safety, technique, hand placement.     Balance Overall balance assessment: Needs assistance         Standing balance support: Bilateral upper extremity supported Standing balance-Leahy Scale: Poor                             ADL either performed or assessed with clinical judgement   ADL Overall ADL's : Needs assistance/impaired Eating/Feeding: Set up;Sitting   Grooming: Set up;Sitting;Wash/dry hands;Wash/dry face   Upper Body Bathing: Supervision/  safety;Sitting   Lower Body Bathing: Minimal assistance;Sit to/from stand;+2 for safety/equipment   Upper Body Dressing : Minimal assistance;Sitting   Lower Body Dressing: Moderate assistance;Sit to/from stand;+2 for safety/equipment   Toilet Transfer: Minimal assistance;+2 for safety/equipment;RW(back to bed)   Toileting- Clothing Manipulation and Hygiene: Minimal assistance;Sit to/from stand               Vision         Perception     Praxis      Pertinent Vitals/Pain Pain Assessment: Faces Faces Pain Scale: Hurts little more Pain Location: both hips and knees Pain Descriptors / Indicators: Discomfort Pain Intervention(s): Limited activity within patient's tolerance;Monitored during session;Repositioned;Ice applied     Hand Dominance Right(uses both for beverages)   Extremity/Trunk Assessment Upper Extremity Assessment Upper Extremity Assessment: Overall WFL for tasks assessed      Cervical / Trunk Assessment Cervical / Trunk Assessment: Kyphotic   Communication Communication Communication: HOH   Cognition Arousal/Alertness: Awake/alert Behavior During Therapy: WFL for tasks assessed/performed Overall Cognitive Status: History of cognitive impairments - at baseline                                 General Comments: no family available.  Likely at baseline. Repeated same question, oriented to city and self   General Comments  c/o dizziness.  Once seated by was 131/73    Exercises     Shoulder Instructions      Home Living Family/patient expects to be  discharged to:: Unsure                                 Additional Comments: Pt is a poor historian and no family present to provide info      Prior Functioning/Environment          Comments: UNKNOWN PLOF; PER CHART USED WALKER        OT Problem List: Decreased strength;Decreased activity tolerance;Impaired balance (sitting and/or standing);Decreased  cognition;Decreased knowledge of use of DME or AE;Pain      OT Treatment/Interventions: Self-care/ADL training;DME and/or AE instruction;Patient/family education;Balance training;Therapeutic activities;Cognitive remediation/compensation    OT Goals(Current goals can be found in the care plan section) Acute Rehab OT Goals Patient Stated Goal: none stated OT Goal Formulation: Patient unable to participate in goal setting Time For Goal Achievement: 01/07/17 Potential to Achieve Goals: Good ADL Goals Pt Will Perform Grooming: with min guard assist;standing Pt Will Perform Lower Body Bathing: with min guard assist;sit to/from stand Pt Will Perform Lower Body Dressing: sit to/from stand;with min assist Pt Will Transfer to Toilet: with min guard assist;ambulating;bedside commode Pt Will Perform Toileting - Clothing Manipulation and hygiene: with min guard assist;sit to/from stand  OT Frequency: Min 2X/week   Barriers to D/C:            Co-evaluation PT/OT/SLP Co-Evaluation/Treatment: Yes Reason for Co-Treatment: For patient/therapist safety PT goals addressed during session: Mobility/safety with mobility OT goals addressed during session: ADL's and self-care      AM-PAC PT "6 Clicks" Daily Activity     Outcome Measure Help from another person eating meals?: A Little Help from another person taking care of personal grooming?: A Little Help from another person toileting, which includes using toliet, bedpan, or urinal?: A Little Help from another person bathing (including washing, rinsing, drying)?: A Little Help from another person to put on and taking off regular upper body clothing?: A Little Help from another person to put on and taking off regular lower body clothing?: A Lot 6 Click Score: 17   End of Session    Activity Tolerance: Patient tolerated treatment well Patient left: in bed;with call bell/phone within reach;with bed alarm set  OT Visit Diagnosis: Pain Pain -  Right/Left: Left Pain - part of body: Hip                Time: [id number] OT Time Calculation (min): 28 min Charges:  OT General Charges $OT Visit: 1 Visit OT Evaluation $OT Eval Moderate Complexity: 1 Mod G-Codes:     White HorseMaryellen Dallen Bunte, OTR/L [phone] 12/31/2016  Kerman Pfost 12/31/2016, 1:21 PM

## 2016-12-31 NOTE — Progress Notes (Signed)
Patient pulled out IV that was in right hand and now has a skin tear to right hand from the tape. Applied pressure to stop bleeding and applied pink foam dressing. Wrapped hand in kerlix and tape to prevent patient from picking at foam dressing. Notified patient's primary RN.

## 2016-12-31 NOTE — Progress Notes (Signed)
Initial Nutrition Assessment  DOCUMENTATION CODES:   Not applicable  INTERVENTION:    Boost Breeze po BID, each supplement provides 250 kcal and 9 grams of protein  30 ml Prostat BID, each supplement provides 100 kcals and 15 grams protein.   Provide MVI daily  NUTRITION DIAGNOSIS:   Increased nutrient needs related to hip fracture, wound healing as evidenced by estimated needs.  GOAL:    MONITOR:   PO intake, Supplement acceptance, Weight trends, Labs, Diet advancement  REASON FOR ASSESSMENT:   Consult Hip fracture protocol  ASSESSMENT:   Pt with PMH significant for dementia, HTN, chronic iron deficiency anemia, and BPH. Presents this admission with left hip fracture after a mechanical fall.    11/20- left hip hemiarthroplasty  Pt confused upon assessment. No family at bedside to provide nutrition history.  Pt has been NPO since 11/19. His diet was advanced 11/20 to clear liquid s/p left hip repair. Spoke with nursing who reports pt had a combative episode this morning and was not able to eat.  Will encourage PO intake once mental status improves. Will add supplementation to aid in healing.   Records indicate pt weighed 150 lb in May 2018. This shows a 13% wt loss in 3 months, which is significant for time frame. Unable to confirm with pt or family. Will need to obtain more information.  Nutrition-Focused physical exam completed. See results below. Suspect muscle depletions are a result of advanced age. Pt likely to have some form of malnutrition but more evidence is needed to diagnose.   Medications reviewed and include: colace, IV abx, LR @ 50 ml/hr Labs reviewed: BUN 32 (H)  NUTRITION - FOCUSED PHYSICAL EXAM:    Most Recent Value  Orbital Region  Mild depletion  Upper Arm Region  Moderate depletion  Thoracic and Lumbar Region  Unable to assess  Buccal Region  Mild depletion  Temple Region  Moderate depletion  Clavicle Bone Region  Severe depletion  Clavicle  and Acromion Bone Region  Severe depletion  Scapular Bone Region  Unable to assess  Dorsal Hand  Mild depletion  Patellar Region  Severe depletion  Anterior Thigh Region  Severe depletion  Posterior Calf Region  Severe depletion  Edema (RD Assessment)  None  Hair  Reviewed  Eyes  Reviewed  Mouth  Unable to assess  Skin  Reviewed  Nails  Reviewed       Diet Order:  No diet orders on file  EDUCATION NEEDS:   Not appropriate for education at this time  Skin:  Skin Assessment: Reviewed RN Assessment  Last BM:  PTA  Height:   Ht Readings from Last 1 Encounters:  12/30/16 5\' 10"  (1.778 m)    Weight:   Wt Readings from Last 1 Encounters:  12/30/16 130 lb 8.2 oz (59.2 kg)    Ideal Body Weight:  75.5 kg  BMI:  Body mass index is 18.73 kg/m.  Estimated Nutritional Needs:   Kcal:  2000-2200 kcal/day  Protein:  100-110 g/day  Fluid:  >2 L/day    Vanessa Kickarly Sandip Power RD, LDN Clinical Nutrition Pager # [phone] con

## 2017-01-01 LAB — URINE CULTURE

## 2017-01-01 LAB — CBC
HCT: 28.2 % — ABNORMAL LOW (ref 39.0–52.0)
HEMOGLOBIN: 9.2 g/dL — AB (ref 13.0–17.0)
MCH: 31.6 pg (ref 26.0–34.0)
MCHC: 32.6 g/dL (ref 30.0–36.0)
MCV: 96.9 fL (ref 78.0–100.0)
PLATELETS: 193 10*3/uL (ref 150–400)
RBC: 2.91 MIL/uL — AB (ref 4.22–5.81)
RDW: 14.3 % (ref 11.5–15.5)
WBC: 7.2 10*3/uL (ref 4.0–10.5)

## 2017-01-01 LAB — BASIC METABOLIC PANEL
Anion gap: 5 (ref 5–15)
BUN: 26 mg/dL — AB (ref 6–20)
CHLORIDE: 107 mmol/L (ref 101–111)
CO2: 28 mmol/L (ref 22–32)
CREATININE: 0.91 mg/dL (ref 0.61–1.24)
Calcium: 8.3 mg/dL — ABNORMAL LOW (ref 8.9–10.3)
GFR calc Af Amer: >60 mL/min (ref >60)
GFR calc non Af Amer: >60 mL/min (ref >60)
GLUCOSE: 88 mg/dL (ref 65–99)
POTASSIUM: 3.5 mmol/L (ref 3.5–5.1)
SODIUM: 140 mmol/L (ref 135–145)

## 2017-01-01 MED ORDER — HALOPERIDOL LACTATE 5 MG/ML IJ SOLN
1.0000 mg | Freq: Four times a day (QID) | INTRAMUSCULAR | Status: DC | PRN
  Administered 2017-01-01 – 2017-01-05 (×8): 1 mg via INTRAVENOUS
  Filled 2017-01-01 (×8): qty 1

## 2017-01-01 MED ORDER — METOPROLOL SUCCINATE ER 25 MG PO TB24
25.0000 mg | ORAL_TABLET | Freq: Every day | ORAL | Status: DC
  Administered 2017-01-01 – 2017-01-09 (×9): 25 mg via ORAL
  Filled 2017-01-01 (×9): qty 1

## 2017-01-01 MED ORDER — METOPROLOL TARTRATE 5 MG/5ML IV SOLN: 2.5000 mg | Freq: Once | INTRAVENOUS | Status: DC

## 2017-01-01 MED ORDER — HALOPERIDOL LACTATE 5 MG/ML IJ SOLN
2.5000 mg | Freq: Once | INTRAMUSCULAR | Status: AC
  Administered 2017-01-01: 2.5 mg via INTRAVENOUS
  Filled 2017-01-01: qty 1

## 2017-01-01 MED ORDER — BUSPIRONE HCL 5 MG PO TABS
5.0000 mg | ORAL_TABLET | Freq: Two times a day (BID) | ORAL | Status: DC
  Administered 2017-01-01 – 2017-01-09 (×15): 5 mg via ORAL
  Filled 2017-01-01 (×14): qty 1

## 2017-01-01 NOTE — Progress Notes (Signed)
Patient was very agitated, tried to pull IV and Foley out; fight and got out of the bed. Notified MD; IV Haldol given 1mg  as ordered.Will continue to monitor patient.

## 2017-01-01 NOTE — Progress Notes (Signed)
Pt became increasingly agitated. IV Haldol given, pt continues to pull at lines, clothes, sheets. Heart rate noted to be elevated in 130's. Tele called stating pt converted to afib. EKG obtained reading a-fib with RVR. Rapid called and hospitalist notified. Rapid in room with charge nurse at the moment. Will continue to monitor pt closely at this time.

## 2017-01-01 NOTE — Progress Notes (Signed)
PROGRESS NOTE    Benjamin Banks  ZOX:[id number] DOB: 10-27-1925 DOA: [id number] PCP: Merlene LaughterStoneking, Hal, MD    Brief Narrative: 83-year-old male with dementia hypertension, hypothyroidism, chronic iron deficiency anemia admitted from SNF following a mechanical fall and left hip pain noted to have displaced femoral neck fracture on the left, seen by orthopedics, underwent left hip hemiarthroplasty 11/21 postoperatively confused and agitated required Ativan this morning. Also remains on IV ceftriaxone for possible urinary tract infection   Assessment & Plan:   #1 closed displaced femoral neck fracture left hip -Secondary to mechanical fall  -Status post left hip hemiarthroplasty 11/21 by Dr. Samson FredericBrian Swinteck -Postoperatively on aspirin 81 mg twice a day per orthopedics for DVT prophylaxis  -Will need SNF for rehabilitation when stable  -PT eval completed -was more alert yesterday  2.  Hypertension -HCTZ on hold, continue hydralazine when necessary  3.  Hypothyroidism Continue home dose Synthroid.  4.  Acute UTI -Continue IV ceftriaxone, urine cultures with 100,000 colonies of gram-negative rods will follow  5.  Dementia -With intermittent delirium, agitation -PRN Ativan low-dose as needed  6. Chronic Iron defi anemia -With postop blood loss, monitor  7. Transient Afib -last night, started low dose Toprol -reverted to NSR immediately, poor long term anticoagulation candidate due to advanced stage dementia and falls  DVT prophylaxis:  Aspirin 81 mg twice a day Per orthopedics Code Status: DNR Family Communication: No family at bedside. Disposition Plan: Likely skilled nursing facility tomorrow if stable   Consultants:   Orthopedics: Dr. Ranell PatrickNorris [id number]  Procedures:   -Left hip hemiarthroplasty 11/20   Antimicrobials:   IV Rocephin 12/30/2016   Subjective: -Was agitated again last night more comfortable now  Objective: Vitals:   12/31/16 0547 12/31/16 1342  12/31/16 2040 01/01/17 0511  BP: 129/63 135/66 132/65 (!) 127/94  Pulse: 80 73 75 74  Resp: (!) 9 20 16 18   Temp: 97.6 F (36.4 C) 98.2 F (36.8 C) 99 F (37.2 C) 98.2 F (36.8 C)  TempSrc: Oral Oral Oral Oral  SpO2: (!) 88% 99% 94% 94%  Weight:      Height:        Intake/Output Summary (Last 24 hours) at 01/01/2017 1204 Last data filed at 01/01/2017 [id number] Gross per 24 hour  Intake 1637.5 ml  Output 900 ml  Net 737.5 ml   Filed Weights   12/30/16 0351  Weight: 59.2 kg (130 lb 8.2 oz)    Examination:  Gen: Somnolent, easily arousable, pleasantly confused elderly frail male HEENT: PERRLA Lungs: Good air movement bilaterally, CTAB CVS: RRR,No Gallops,Rubs or new Murmurs Abd: soft, Non tender, non distended, BS present Extremities: Left hip with dressing Skin: no new rashes Psychiatry: Confused, unable to assess    Data Reviewed: I have personally reviewed following labs and imaging studies  CBC: Recent Labs  Lab 12/29/16 2243 12/30/16 0611 12/30/16 0901 12/31/16 0602 01/01/17 0626  WBC 3.8* QUESTIONABLE RESULTS, RECOMMEND RECOLLECT TO VERIFY 13.8* 9.9 7.2  NEUTROABS 3.5 QUESTIONABLE RESULTS, RECOMMEND RECOLLECT TO VERIFY 12.5* 8.3*  --   HGB 18.6* QUESTIONABLE RESULTS, RECOMMEND RECOLLECT TO VERIFY 10.6* 9.5* 9.2*  HCT 53.4* QUESTIONABLE RESULTS, RECOMMEND RECOLLECT TO VERIFY 32.1* 28.9* 28.2*  MCV 94.8 QUESTIONABLE RESULTS, RECOMMEND RECOLLECT TO VERIFY 96.7 98.0 96.9  PLT 126* QUESTIONABLE RESULTS, RECOMMEND RECOLLECT TO VERIFY 227 195 193   Basic Metabolic Panel: Recent Labs  Lab 12/29/16 2243 12/30/16 0611 12/30/16 0901 12/31/16 0602 01/01/17 0626  NA 139 QUESTIONABLE RESULTS, RECOMMEND RECOLLECT TO VERIFY  138 138 140  K 3.4* QUESTIONABLE RESULTS, RECOMMEND RECOLLECT TO VERIFY 4.2 3.8 3.5  CL 103 QUESTIONABLE RESULTS, RECOMMEND RECOLLECT TO VERIFY 104 107 107  CO2 27 QUESTIONABLE RESULTS, RECOMMEND RECOLLECT TO VERIFY 28 27 28   GLUCOSE 111*  QUESTIONABLE RESULTS, RECOMMEND RECOLLECT TO VERIFY 125* 102* 88  BUN 34* QUESTIONABLE RESULTS, RECOMMEND RECOLLECT TO VERIFY 33* 32* 26*  CREATININE 1.05 QUESTIONABLE RESULTS, RECOMMEND RECOLLECT TO VERIFY 1.07 0.91 0.91  CALCIUM 9.2 QUESTIONABLE RESULTS, RECOMMEND RECOLLECT TO VERIFY 8.5* 8.1* 8.3*  MG  --   --   --  2.0  --    GFR: Estimated Creatinine Clearance: 47 mL/min (by C-G formula based on SCr of 0.91 mg/dL). Liver Function Tests: No results for input(s): AST, ALT, ALKPHOS, BILITOT, PROT, ALBUMIN in the last 168 hours. No results for input(s): LIPASE, AMYLASE in the last 168 hours. No results for input(s): AMMONIA in the last 168 hours. Coagulation Profile: Recent Labs  Lab 12/29/16 2243  INR 1.16   Cardiac Enzymes: No results for input(s): CKTOTAL, CKMB, CKMBINDEX, TROPONINI in the last 168 hours. BNP (last 3 results) No results for input(s): PROBNP in the last 8760 hours. HbA1C: No results for input(s): HGBA1C in the last 72 hours. CBG: No results for input(s): GLUCAP in the last 168 hours. Lipid Profile: No results for input(s): CHOL, HDL, LDLCALC, TRIG, CHOLHDL, LDLDIRECT in the last 72 hours. Thyroid Function Tests: No results for input(s): TSH, T4TOTAL, FREET4, T3FREE, THYROIDAB in the last 72 hours. Anemia Panel: Recent Labs    12/31/16 0602  VITAMINB12 324  FOLATE 20.9  FERRITIN 103  TIBC 214*  IRON 18*  RETICCTPCT 1.1   Sepsis Labs: No results for input(s): PROCALCITON, LATICACIDVEN in the last 168 hours.  Recent Results (from the past 240 hour(s))  Surgical PCR screen     Status: None   Collection Time: 12/30/16  4:51 AM  Result Value Ref Range Status   MRSA, PCR NEGATIVE NEGATIVE Final   Staphylococcus aureus NEGATIVE NEGATIVE Final    Comment: (NOTE) The Xpert SA Assay (FDA approved for NASAL specimens in patients 59 years of age and older), is one component of a comprehensive surveillance program. It is not intended to diagnose infection  nor to guide or monitor treatment.   Culture, Urine     Status: Abnormal   Collection Time: 12/30/16  8:51 AM  Result Value Ref Range Status   Specimen Description URINE, CLEAN CATCH  Final   Special Requests NONE  Final   Culture >=100,000 COLONIES/mL ESCHERICHIA COLI (A)  Final   Report Status 01/01/2017 FINAL  Final   Organism ID, Bacteria ESCHERICHIA COLI (A)  Final      Susceptibility   Escherichia coli - MIC*    AMPICILLIN >=32 RESISTANT Resistant     CEFAZOLIN >=64 RESISTANT Resistant     CEFTRIAXONE <=1 SENSITIVE Sensitive     CIPROFLOXACIN >=4 RESISTANT Resistant     GENTAMICIN <=1 SENSITIVE Sensitive     IMIPENEM <=0.25 SENSITIVE Sensitive     NITROFURANTOIN <=16 SENSITIVE Sensitive     TRIMETH/SULFA <=20 SENSITIVE Sensitive     AMPICILLIN/SULBACTAM >=32 RESISTANT Resistant     PIP/TAZO 64 INTERMEDIATE Intermediate     Extended ESBL NEGATIVE Sensitive     * >=100,000 COLONIES/mL ESCHERICHIA COLI         Radiology Studies: Pelvis Portable  Result Date: 12/30/2016 CLINICAL DATA:  Post LEFT hip arthroplasty EXAM: PORTABLE PELVIS 1-2 VIEWS COMPARISON:  Portable exam 1553 hours compared  to intraoperative images of 12/30/2016 FINDINGS: LEFT hip prosthesis identified in expected position. No definite fracture or dislocation. Bones appear demineralized. Scattered soft tissue gas. Catheter within urinary bladder. IMPRESSION: LEFT hip prosthesis without acute complication. Electronically Signed   By: Ulyses SouthwardMark  Boles M.D.   On: 12/30/2016 16:20   Dg C-arm 61-120 Min-no Report  Result Date: 12/30/2016 Fluoroscopy was utilized by the requesting physician.  No radiographic interpretation.   Dg Hip Operative Unilat W Or W/o Pelvis Left  Result Date: 12/30/2016 CLINICAL DATA:  Status post total hip replacement on the left EXAM: OPERATIVE LEFT HIP 1 VIEW TECHNIQUE: Fluoroscopic spot image(s) were submitted for interpretation post-operatively. FLUOROSCOPY TIME:  0 minutes 18 seconds;  1.75 mGy; 2 acquired images COMPARISON:  December 29, 2016 FINDINGS: There is a total hip replacement on the left with prosthetic components well-seated. No fracture or dislocation. Visualized right hip joint appears normal. IMPRESSION: Prosthetic components for total hip replacement on the left appear well seated. No fracture or dislocation evident. Electronically Signed   By: Bretta BangWilliam  Woodruff III M.D.   On: 12/30/2016 14:40        Scheduled Meds: . aspirin EC  81 mg Oral BID WC  . atorvastatin  10 mg Oral q1800  . busPIRone  7.5 mg Oral BID  . docusate sodium  200 mg Oral QPC breakfast  . feeding supplement  1 Container Oral BID BM  . feeding supplement (PRO-STAT SUGAR FREE 64)  30 mL Oral BID  . levothyroxine  50 mcg Oral QAC breakfast  . metoprolol succinate  25 mg Oral Daily  . mirtazapine  15 mg Oral QHS  . multivitamin with minerals  1 tablet Oral Daily  . sertraline  25 mg Oral Daily   Continuous Infusions: . cefTRIAXone (ROCEPHIN)  IV Stopped (12/30/16 1839)     LOS: 2 days    Time spent: 35 mins    Zannie CovePreetha Cortez Flippen, MD [facility] Page via [email or web address], password [email or web address]   If 7PM-7AM, please contact night-coverage [email or web address] Password [email or web address] 01/01/2017, 12:04 PM

## 2017-01-01 NOTE — Progress Notes (Signed)
Subjective: 2 Days Post-Op Procedure(s) (LRB): LEFT HIP HEMIARTHROPLASTY  ANTERIOR APPROACH (Left)  Patient was sleeping upon arrival.  I roused him, but he was not cooperative with answering questions this morning.  He spoke more freely to the person delivering his food after I left the room than with me.  Resting comfortably in bed.  Objective:   VITALS:  Temp:  [98.2 F (36.8 C)-99 F (37.2 C)] 98.2 F (36.8 C) (11/22 0511) Pulse Rate:  [73-75] 74 (11/22 0511) Resp:  [16-20] 18 (11/22 0511) BP: (127-135)/(65-94) 127/94 (11/22 0511) SpO2:  [94 %-99 %] 94 % (11/22 0511)  General: WDWN patient in NAD. Psych:  Appropriate mood and affect. Neuro:  A&O x 3, Moving all extremities, sensation intact to light touch HEENT:  EOMs intact Chest:  Even non-labored respirations Skin: Dressing C/D/I, no rashes or lesions Pulses: Popliteus 2+ Extremities: Mild edema.  No erythema, ecchymosis, or lymphadema MSK: unable to be adequately assessed due to patient's lack of cooperation.    LABS Recent Labs    12/29/16 2243 12/30/16 0611 12/30/16 0901 12/31/16 0602 01/01/17 0626  HGB 18.6* QUESTIONABLE RESULTS, RECOMMEND RECOLLECT TO VERIFY 10.6* 9.5* 9.2*  WBC 3.8* QUESTIONABLE RESULTS, RECOMMEND RECOLLECT TO VERIFY 13.8* 9.9 7.2  PLT 126* QUESTIONABLE RESULTS, RECOMMEND RECOLLECT TO VERIFY 227 195 193   Recent Labs    12/31/16 0602 01/01/17 0626  NA 138 140  K 3.8 3.5  CL 107 107  CO2 27 28  BUN 32* 26*  CREATININE 0.91 0.91  GLUCOSE 102* 88   Recent Labs    12/29/16 2243  INR 1.16     Assessment/Plan: 2 Days Post-Op Procedure(s) (LRB): LEFT HIP HEMIARTHROPLASTY  ANTERIOR APPROACH (Left)  Patient seen in rounds for Dr. Linde GillisSwinteck WBAT L LE Up with therapy ASA for DVT prophylaxis Disposition: D/C to SNF vs memory care pending  Joana ReamerJustin Ollis, PA-C [facility] Office:  [phone]

## 2017-01-01 NOTE — Significant Event (Signed)
Rapid Response Event Note  Overview: Time Called: 0250 Event Type: Cardiac  Initial Focused Assessment:  Called by primary RN to bedside due to pt had new onset of A-fib, which was called to RN by centralized telemetry.  On my arrival RN at bedside getting a 12-lead EKG.  Pt presently resting in bed no obvious signs of distress noted.  Pt does have history of dementia, noticed patient able to move all extremities, neuro status is at baseline for him.  I connected p to rapid response EKG monitor, which showed A-fib with rates ranging from 106 to 118.  Primary RN advised had gotten as high as 140s, BP stable with SBP 116.  No respiratory distress.  Advised primary RN to advise Rollen SoxKatherine Schorr,NP of the situation, she advised me that she has already put a page in for [place].  I reviewed the chart and found the same as the primary RN of no previous history of A-fib, no PTA meds that could indicate a history of A-fib.  Pt does take ASA daily.  Advised primary RN to ask Natalia LeatherwoodKatherine about a possible anticoagulant.     Interventions:   12-Lead EKG (already being done), Level of Care is already Telemetry, Vital Signs  Plan of Care (if not transferred):  If HR sustains above 130 or pt's BP is below 100 systolic or any change in condition please call me back to the bedside to re-assess.  Primary RN advised that Rollen SoxKatherine Schorr,NP called back and she wanted HR below 100, so she ordered Lopressor.  As primary RN was preparing to give pt converted back to NSR.  Primary RN held Lopressor.  Event Summary:  Name of Physician Notified: Rollen SoxKatherine Schorr,NP at [phone]   Outcome: Stayed in room and stabalized    Prudence DavidsonHans C Zarinah Oviatt,RN,BSN,CCRN [place] [facility]/SD Care Coordinator / Rapid Response Nurse

## 2017-01-02 DIAGNOSIS — F0391 Unspecified dementia with behavioral disturbance: Secondary | ICD-10-CM

## 2017-01-02 LAB — CBC
HCT: 32 % — ABNORMAL LOW (ref 39.0–52.0)
Hemoglobin: 10.6 g/dL — ABNORMAL LOW (ref 13.0–17.0)
MCH: 31.6 pg (ref 26.0–34.0)
MCHC: 33.1 g/dL (ref 30.0–36.0)
MCV: 95.5 fL (ref 78.0–100.0)
PLATELETS: 220 10*3/uL (ref 150–400)
RBC: 3.35 MIL/uL — ABNORMAL LOW (ref 4.22–5.81)
RDW: 14 % (ref 11.5–15.5)
WBC: 6.7 10*3/uL (ref 4.0–10.5)

## 2017-01-02 MED ORDER — HALOPERIDOL LACTATE 5 MG/ML IJ SOLN
1.0000 mg | Freq: Once | INTRAMUSCULAR | Status: AC
  Administered 2017-01-02: 1 mg via INTRAVENOUS
  Filled 2017-01-02: qty 1

## 2017-01-02 MED ORDER — RISPERIDONE 0.25 MG PO TABS
0.5000 mg | ORAL_TABLET | Freq: Every day | ORAL | Status: DC
  Administered 2017-01-03 – 2017-01-08 (×6): 0.5 mg via ORAL
  Filled 2017-01-02 (×6): qty 2

## 2017-01-02 NOTE — Progress Notes (Signed)
     Subjective: 3 Days Post-Op Procedure(s) (LRB): LEFT HIP HEMIARTHROPLASTY  ANTERIOR APPROACH (Left)   Patient not expressing much pain.  Resting comfortably in bed.   Objective:   VITALS:   Vitals:   01/01/17 2137 01/02/17 0653  BP: (!) 161/89 (!) 145/59  Pulse: 87 (!) 51  Resp: 20   Temp:  98.3 F (36.8 C)  SpO2: 90%     Dorsiflexion/Plantar flexion intact Incision: dressing C/D/I No cellulitis present Compartment soft  LABS Recent Labs    12/31/16 0602 01/01/17 0626 01/02/17 0851  HGB 9.5* 9.2* 10.6*  HCT 28.9* 28.2* 32.0*  WBC 9.9 7.2 6.7  PLT 195 193 220    Recent Labs    12/31/16 0602 01/01/17 0626  NA 138 140  K 3.8 3.5  BUN 32* 26*  CREATININE 0.91 0.91  GLUCOSE 102* 88     Assessment/Plan: 3 Days Post-Op Procedure(s) (LRB): LEFT HIP HEMIARTHROPLASTY  ANTERIOR APPROACH (Left) Up with therapy WBAT L LE Up with therapy ASA for DVT prophylaxis Disposition: D/C to SNF vs memory care pending    Benjamin AuerbachMatthew S. Cindy Banks   PAC  01/02/2017, 9:09 AM

## 2017-01-02 NOTE — Progress Notes (Signed)
LCSW following for facility placement.  [facility].   PT/OT recs that patient goes to SNF or to ALF with 24 hour supervision.  LCSW spoke with facility and awaiting to see if they can meet his needs.  LCSW faxed patient out over SNF HUB.  LCSW spoke with POA who is agreeable with patient returning to [facility] with PT/OT. POA feels it will be best and patient will be less confused.    If patient has to go to SNF they they prefer a facility that can transition patient in to memory care once rehab is complete, if such option is available to decrease patients confusion.    [facility]. Application in progress.   Benjamin Banks, LSCW [facility] [phone]

## 2017-01-02 NOTE — NC FL2 (Signed)
[place] MEDICAID FL2 LEVEL OF CARE SCREENING TOOL     IDENTIFICATION  Patient Name: Benjamin Banks Birthdate: 12-07-1925 Sex: male Admission Date (Current Location): 12020-10-1416  [facility] [place]   Facility and Address:  [facility],  [address], [place] [id number]      Provider Number: [id number]  Attending Physician Name and Address:  Zannie CoveJoseph, Preetha, MD  Relative Name and Phone Number:       Current Level of Care: Hospital Recommended Level of Care: Skilled Nursing Facility Prior Approval Number:    Date Approved/Denied: 04/29/16 PASRR Number: [id number]  Discharge Plan: SNF    Current Diagnoses: Patient Active Problem List   Diagnosis Date Noted  . Acute lower UTI 12/30/2016  . Displaced fracture of left femoral neck (HCC) 12/30/2016  . Closed left hip fracture, initial encounter (HCC) 12020-10-1416  . Leukopenia 12020-10-1416  . Thrombocytopenia (HCC) 12020-10-1416  . Hypoxia 12020-10-1416  . AKI (acute kidney injury) (HCC) 04/29/2016  . Dementia 04/29/2016  . Cellulitis of right upper extremity 04/29/2016  . Failure to thrive in adult 04/29/2016  . Depression 04/29/2016  . Hypothyroidism 04/29/2016  . Head injury   . Fall 04/28/2016  . Exertional chest pain 01/11/2014  . Angina decubitus (HCC) 01/11/2014  . Hyperlipidemia 01/11/2014  . Essential hypertension 01/11/2014    Orientation RESPIRATION BLADDER Height & Weight     Self  O2 Continent(Has foley catheter in hospital. ) Weight: 130 lb 8.2 oz (59.2 kg) Height:  5\' 10"  (177.8 cm)(Estimation)  BEHAVIORAL SYMPTOMS/MOOD NEUROLOGICAL BOWEL NUTRITION STATUS      Continent Diet(See DC summary)  AMBULATORY STATUS COMMUNICATION OF NEEDS Skin   Extensive Assist Verbally Surgical wounds, Skin abrasions, Bruising(Right hand, Left hip)                       Personal Care Assistance Level of Assistance  Bathing, Feeding, Dressing Bathing Assistance: Limited  assistance Feeding assistance: Independent Dressing Assistance: Limited assistance     Functional Limitations Info  Sight, Hearing, Speech Sight Info: Adequate Hearing Info: Adequate Speech Info: Adequate    SPECIAL CARE FACTORS FREQUENCY  PT (By licensed PT), OT (By licensed OT)     PT Frequency: 5x/week OT Frequency: 5x/week            Contractures Contractures Info: Not present    Additional Factors Info  Code Status, Allergies Code Status Info: DNR Allergies Info: NKA           Current Medications (01/02/2017):  This is the current hospital active medication list Current Facility-Administered Medications  Medication Dose Route Frequency Provider Last Rate Last Dose  . acetaminophen (TYLENOL) tablet 650 mg  650 mg Oral Q6H PRN Swinteck, Arlys JohnBrian, MD       Or  . acetaminophen (TYLENOL) suppository 650 mg  650 mg Rectal Q6H PRN Swinteck, Arlys JohnBrian, MD      . aspirin EC tablet 81 mg  81 mg Oral BID WC Samson FredericSwinteck, Brian, MD   81 mg at 01/02/17 0851  . atorvastatin (LIPITOR) tablet 10 mg  10 mg Oral q1800 Opyd, Lavone Neriimothy S, MD   10 mg at 01/01/17 1616  . bisacodyl (DULCOLAX) EC tablet 5 mg  5 mg Oral Daily PRN Opyd, Lavone Neriimothy S, MD      . busPIRone (BUSPAR) tablet 5 mg  5 mg Oral BID Zannie CoveJoseph, Preetha, MD   5 mg at 01/02/17 0851  . cefTRIAXone (ROCEPHIN) 1 g in dextrose 5 %  50 mL IVPB  1 g Intravenous Q24H Rodolph Bonghompson, Daniel V, MD   Stopped at 01/01/17 1759  . docusate sodium (COLACE) capsule 200 mg  200 mg Oral QPC breakfast Opyd, Lavone Neriimothy S, MD   200 mg at 01/02/17 0849  . feeding supplement (BOOST / RESOURCE BREEZE) liquid 1 Container  1 Container Oral BID BM Zannie CoveJoseph, Preetha, MD   1 Container at 01/01/17 1311  . feeding supplement (PRO-STAT SUGAR FREE 64) liquid 30 mL  30 mL Oral BID Opyd, Lavone Neriimothy S, MD   30 mL at 01/02/17 0850  . haloperidol lactate (HALDOL) injection 1 mg  1 mg Intravenous Q6H PRN Zannie CoveJoseph, Preetha, MD   1 mg at 01/02/17 0300  . hydrALAZINE (APRESOLINE) injection 10 mg   10 mg Intravenous Q4H PRN Opyd, Lavone Neriimothy S, MD      . HYDROcodone-acetaminophen (NORCO/VICODIN) 5-325 MG per tablet 1-2 tablet  1-2 tablet Oral Q6H PRN Opyd, Lavone Neriimothy S, MD   1 tablet at 01/02/17 0849  . levothyroxine (SYNTHROID, LEVOTHROID) tablet 50 mcg  50 mcg Oral QAC breakfast Opyd, Lavone Neriimothy S, MD   50 mcg at 01/02/17 0851  . menthol-cetylpyridinium (CEPACOL) lozenge 3 mg  1 lozenge Oral PRN Swinteck, Arlys JohnBrian, MD       Or  . phenol (CHLORASEPTIC) mouth spray 1 spray  1 spray Mouth/Throat PRN Swinteck, Arlys JohnBrian, MD      . metoprolol succinate (TOPROL-XL) 24 hr tablet 25 mg  25 mg Oral Daily Zannie CoveJoseph, Preetha, MD   25 mg at 01/02/17 0849  . mirtazapine (REMERON) tablet 15 mg  15 mg Oral QHS Opyd, Lavone Neriimothy S, MD   15 mg at 01/01/17 2100  . morphine 4 MG/ML injection 0.52 mg  0.52 mg Intravenous Q2H PRN Opyd, Lavone Neriimothy S, MD   0.52 mg at 01/01/17 1745  . multivitamin with minerals tablet 1 tablet  1 tablet Oral Daily Zannie CoveJoseph, Preetha, MD   1 tablet at 01/02/17 0850  . ondansetron (ZOFRAN) tablet 4 mg  4 mg Oral Q6H PRN Swinteck, Arlys JohnBrian, MD       Or  . ondansetron (ZOFRAN) injection 4 mg  4 mg Intravenous Q6H PRN Swinteck, Arlys JohnBrian, MD      . senna-docusate (Senokot-S) tablet 1 tablet  1 tablet Oral QHS PRN Opyd, Lavone Neriimothy S, MD      . sertraline (ZOLOFT) tablet 25 mg  25 mg Oral Daily Opyd, Lavone Neriimothy S, MD   25 mg at 01/02/17 [id number]     Discharge Medications: Please see discharge summary for a list of discharge medications.  Relevant Imaging Results:  Relevant Lab Results:   Additional Information SS#[id number]  Coralyn HellingBernette Juddson Cobern, LCSW

## 2017-01-02 NOTE — Progress Notes (Addendum)
PROGRESS NOTE    Benjamin Banks  ZOX:[id number] DOB: 08-Jan-1926 DOA: 110/17/2018 PCP: Merlene Laughter, MD    Brief Narrative: 83 year old male with dementia hypertension, hypothyroidism, chronic iron deficiency anemia admitted from SNF following a mechanical fall and left hip pain noted to have displaced femoral neck fracture on the left, seen by orthopedics, underwent left hip hemiarthroplasty 11/21 postoperatively confused and agitated required Ativan this morning. Also remains on IV ceftriaxone for possible urinary tract infection . Hospitalization complicated by increased delirium and agitation  Assessment & Plan:   #1 closed displaced femoral neck fracture left hip -Secondary to mechanical fall  -Status post left hip hemiarthroplasty 11/21 by Dr. Samson Frederic -Postoperatively on aspirin 81 mg twice a day per orthopedics for DVT prophylaxis  -Will need SNF for rehabilitation when stable  -PT eval completed -lot of issues with agitation last pm  2.  Hypertension -HCTZ on hold, continue hydralazine when necessary  3.  Hypothyroidism Continue home dose Synthroid.  4.  Acute UTI -Continue IV ceftriaxone, urine cultures with 100,000 colonies of gram-negative rods will follow  5.  Dementia with delirium and agitation -With intermittent delirium, agitation -will add low dose Risperidone qpm, continue PRN Haldol, QTc is ok, repeat EKG tomorrow  6. Chronic Iron defi anemia -With postop blood loss, monitor  7. Transient Afib -2 night ago had transient AFib, started low dose Toprol -reverted to NSR immediately, poor long term anticoagulation candidate due to advanced stage dementia and falls  DVT prophylaxis:  Aspirin 81 mg twice a day Per orthopedics Code Status: DNR Family Communication: No family at bedside. Disposition Plan: SNF when agitation improved, recommend palliative care follow-up at SNF   Consultants:   Orthopedics: Dr. Ranell Patrick 110/17/2018  Procedures:    -Left hip hemiarthroplasty 11/20   Antimicrobials:   IV Rocephin 12/30/2016   Subjective: -Extremely agitated last night was given IV Haldol, IV Ativan and IV morphine before he fell asleep  Objective: Vitals:   12/31/16 2040 01/01/17 0511 01/01/17 2137 01/02/17 0653  BP:  (!) 127/94 (!) 161/89 (!) 145/59  Pulse: 75 74 87 (!) 51  Resp: 16 18 20    Temp: 99 F (37.2 C) 98.2 F (36.8 C)  98.3 F (36.8 C)  TempSrc: Oral Oral  Oral  SpO2: 94% 94% 90%   Weight:      Height:        Intake/Output Summary (Last 24 hours) at 01/02/2017 1122 Last data filed at 01/01/2017 1728 Gross per 24 hour  Intake 50 ml  Output 500 ml  Net -450 ml   Filed Weights   12/30/16 0351  Weight: 59.2 kg (130 lb 8.2 oz)    Examination:  Gen: Elderly frail Caucasian male, somnolent easily arousable, confused HEENT: PERRLA, Neck supple, no JVD Lungs: Clear anteriorly CVS: RRR,No Gallops,Rubs or new Murmurs Abd: soft, Non tender, non distended, BS present Extremities: Left hip with dressing Skin: no new rashes Psychiatry: Confused, unable to assess    Data Reviewed: I have personally reviewed following labs and imaging studies  CBC: Recent Labs  Lab 12/29/16 2243 12/30/16 0611 12/30/16 0901 12/31/16 0602 01/01/17 0626 01/02/17 0851  WBC 3.8* QUESTIONABLE RESULTS, RECOMMEND RECOLLECT TO VERIFY 13.8* 9.9 7.2 6.7  NEUTROABS 3.5 QUESTIONABLE RESULTS, RECOMMEND RECOLLECT TO VERIFY 12.5* 8.3*  --   --   HGB 18.6* QUESTIONABLE RESULTS, RECOMMEND RECOLLECT TO VERIFY 10.6* 9.5* 9.2* 10.6*  HCT 53.4* QUESTIONABLE RESULTS, RECOMMEND RECOLLECT TO VERIFY 32.1* 28.9* 28.2* 32.0*  MCV 94.8 QUESTIONABLE RESULTS,  RECOMMEND RECOLLECT TO VERIFY 96.7 98.0 96.9 95.5  PLT 126* QUESTIONABLE RESULTS, RECOMMEND RECOLLECT TO VERIFY 227 195 193 220   Basic Metabolic Panel: Recent Labs  Lab 12/29/16 2243 12/30/16 0611 12/30/16 0901 12/31/16 0602 01/01/17 0626  NA 139 QUESTIONABLE RESULTS, RECOMMEND  RECOLLECT TO VERIFY 138 138 140  K 3.4* QUESTIONABLE RESULTS, RECOMMEND RECOLLECT TO VERIFY 4.2 3.8 3.5  CL 103 QUESTIONABLE RESULTS, RECOMMEND RECOLLECT TO VERIFY 104 107 107  CO2 27 QUESTIONABLE RESULTS, RECOMMEND RECOLLECT TO VERIFY 28 27 28   GLUCOSE 111* QUESTIONABLE RESULTS, RECOMMEND RECOLLECT TO VERIFY 125* 102* 88  BUN 34* QUESTIONABLE RESULTS, RECOMMEND RECOLLECT TO VERIFY 33* 32* 26*  CREATININE 1.05 QUESTIONABLE RESULTS, RECOMMEND RECOLLECT TO VERIFY 1.07 0.91 0.91  CALCIUM 9.2 QUESTIONABLE RESULTS, RECOMMEND RECOLLECT TO VERIFY 8.5* 8.1* 8.3*  MG  --   --   --  2.0  --    GFR: Estimated Creatinine Clearance: 47 mL/min (by C-G formula based on SCr of 0.91 mg/dL). Liver Function Tests: No results for input(s): AST, ALT, ALKPHOS, BILITOT, PROT, ALBUMIN in the last 168 hours. No results for input(s): LIPASE, AMYLASE in the last 168 hours. No results for input(s): AMMONIA in the last 168 hours. Coagulation Profile: Recent Labs  Lab 12/29/16 2243  INR 1.16   Cardiac Enzymes: No results for input(s): CKTOTAL, CKMB, CKMBINDEX, TROPONINI in the last 168 hours. BNP (last 3 results) No results for input(s): PROBNP in the last 8760 hours. HbA1C: No results for input(s): HGBA1C in the last 72 hours. CBG: No results for input(s): GLUCAP in the last 168 hours. Lipid Profile: No results for input(s): CHOL, HDL, LDLCALC, TRIG, CHOLHDL, LDLDIRECT in the last 72 hours. Thyroid Function Tests: No results for input(s): TSH, T4TOTAL, FREET4, T3FREE, THYROIDAB in the last 72 hours. Anemia Panel: Recent Labs    12/31/16 0602  VITAMINB12 324  FOLATE 20.9  FERRITIN 103  TIBC 214*  IRON 18*  RETICCTPCT 1.1   Sepsis Labs: No results for input(s): PROCALCITON, LATICACIDVEN in the last 168 hours.  Recent Results (from the past 240 hour(s))  Surgical PCR screen     Status: None   Collection Time: 12/30/16  4:51 AM  Result Value Ref Range Status   MRSA, PCR NEGATIVE NEGATIVE Final    Staphylococcus aureus NEGATIVE NEGATIVE Final    Comment: (NOTE) The Xpert SA Assay (FDA approved for NASAL specimens in patients 81 years of age and older), is one component of a comprehensive surveillance program. It is not intended to diagnose infection nor to guide or monitor treatment.   Culture, Urine     Status: Abnormal   Collection Time: 12/30/16  8:51 AM  Result Value Ref Range Status   Specimen Description URINE, CLEAN CATCH  Final   Special Requests NONE  Final   Culture >=100,000 COLONIES/mL ESCHERICHIA COLI (A)  Final   Report Status 01/01/2017 FINAL  Final   Organism ID, Bacteria ESCHERICHIA COLI (A)  Final      Susceptibility   Escherichia coli - MIC*    AMPICILLIN >=32 RESISTANT Resistant     CEFAZOLIN >=64 RESISTANT Resistant     CEFTRIAXONE <=1 SENSITIVE Sensitive     CIPROFLOXACIN >=4 RESISTANT Resistant     GENTAMICIN <=1 SENSITIVE Sensitive     IMIPENEM <=0.25 SENSITIVE Sensitive     NITROFURANTOIN <=16 SENSITIVE Sensitive     TRIMETH/SULFA <=20 SENSITIVE Sensitive     AMPICILLIN/SULBACTAM >=32 RESISTANT Resistant     PIP/TAZO 64 INTERMEDIATE Intermediate  Extended ESBL NEGATIVE Sensitive     * >=100,000 COLONIES/mL ESCHERICHIA COLI         Radiology Studies: No results found.      Scheduled Meds: . aspirin EC  81 mg Oral BID WC  . atorvastatin  10 mg Oral q1800  . busPIRone  5 mg Oral BID  . docusate sodium  200 mg Oral QPC breakfast  . feeding supplement  1 Container Oral BID BM  . feeding supplement (PRO-STAT SUGAR FREE 64)  30 mL Oral BID  . levothyroxine  50 mcg Oral QAC breakfast  . metoprolol succinate  25 mg Oral Daily  . mirtazapine  15 mg Oral QHS  . multivitamin with minerals  1 tablet Oral Daily  . sertraline  25 mg Oral Daily   Continuous Infusions: . cefTRIAXone (ROCEPHIN)  IV Stopped (01/01/17 1759)     LOS: 3 days    Time spent: 35 mins    Zannie CovePreetha Summit Borchardt, MD [facility] Page via [email or web address], password  [email or web address]   If 7PM-7AM, please contact night-coverage [email or web address] Password [email or web address] 01/02/2017, 11:22 AM

## 2017-01-02 NOTE — Progress Notes (Signed)
PT Cancellation Note  Patient Details Name: Benjamin Banks MRN: [id number] DOB: September 18, 1925   Cancelled Treatment:    Reason Eval/Treat Not Completed: Patient's level of consciousness--medicated with haldol this am due to agitation. Unable to awaken pt for participation at this time. Will check back another day.    Rebeca AlertJannie Creg Gilmer, MPT Pager: [phone]

## 2017-01-02 NOTE — Progress Notes (Signed)
OT Cancellation Note  Patient Details Name: Benjamin Banks MRN: [id number] DOB: 25-Apr-1925   Cancelled Treatment:    Reason Eval/Treat Not Completed: Patient's level of consciousness.  Pt could not stay aroused for OT. Will check another day.  Danniell Rotundo 01/02/2017, 1:42 PM  Marica OtterMaryellen Hurbert Duran, OTR/L [phone] 01/02/2017

## 2017-01-02 NOTE — Progress Notes (Signed)
Date: January 02, 2017 Orlanda Lemmerman, BSN, RN3, CCM  [phone] Chart and notes review for patient progress and needs. Will follow for case management and discharge needs. Next review date: [id number] 

## 2017-01-02 NOTE — Progress Notes (Signed)
Patient was increasingly agitated; notified MD; Haldol 1mg  once was given; patient pulled Foley cath. Out; no bleed; notified MD at same time.

## 2017-01-03 LAB — CBC
HCT: 30.6 % — ABNORMAL LOW (ref 39.0–52.0)
HEMOGLOBIN: 10.3 g/dL — AB (ref 13.0–17.0)
MCH: 31.9 pg (ref 26.0–34.0)
MCHC: 33.7 g/dL (ref 30.0–36.0)
MCV: 94.7 fL (ref 78.0–100.0)
Platelets: 229 10*3/uL (ref 150–400)
RBC: 3.23 MIL/uL — AB (ref 4.22–5.81)
RDW: 13.6 % (ref 11.5–15.5)
WBC: 6.4 10*3/uL (ref 4.0–10.5)

## 2017-01-03 LAB — BASIC METABOLIC PANEL
ANION GAP: 10 (ref 5–15)
BUN: 21 mg/dL — AB (ref 6–20)
CHLORIDE: 106 mmol/L (ref 101–111)
CO2: 26 mmol/L (ref 22–32)
Calcium: 8.3 mg/dL — ABNORMAL LOW (ref 8.9–10.3)
Creatinine, Ser: 0.67 mg/dL (ref 0.61–1.24)
GFR calc non Af Amer: >60 mL/min (ref >60)
Glucose, Bld: 77 mg/dL (ref 65–99)
POTASSIUM: 3.5 mmol/L (ref 3.5–5.1)
SODIUM: 142 mmol/L (ref 135–145)

## 2017-01-03 MED ORDER — LORAZEPAM 2 MG/ML IJ SOLN
0.5000 mg | Freq: Once | INTRAMUSCULAR | Status: AC
  Administered 2017-01-03: 0.5 mg via INTRAVENOUS
  Filled 2017-01-03: qty 1

## 2017-01-03 MED ORDER — LORAZEPAM 2 MG/ML IJ SOLN
1.0000 mg | INTRAMUSCULAR | Status: DC | PRN
  Administered 2017-01-04 – 2017-01-08 (×6): 1 mg via INTRAVENOUS
  Filled 2017-01-03 (×6): qty 1

## 2017-01-03 NOTE — Progress Notes (Signed)
[facility] POA, Benjamin DimmerJudith Banks, Research officer, political partycalled writer at this time. Writer informed her of pt restraints.

## 2017-01-03 NOTE — Progress Notes (Signed)
Subjective: 4 Days Post-Op Procedure(s) (LRB): LEFT HIP HEMIARTHROPLASTY  ANTERIOR APPROACH (Left) Patient reports pain as mild.  Restrained in bed as of this am. Resting in bed.   Objective: Vital signs in last 24 hours: Temp:  [97.8 F (36.6 C)-98.1 F (36.7 C)] 97.8 F (36.6 C) (11/24 0636) Pulse Rate:  [70-71] 71 (11/24 0636) Resp:  [16] 16 (11/24 0636) BP: (155-159)/(77-81) 155/77 (11/24 0636) SpO2:  [93 %-94 %] 94 % (11/24 0636)  Intake/Output from previous day: 11/23 0701 - 11/24 0700 In: -  Out: 500 [Urine:500] Intake/Output this shift: No intake/output data recorded.  Recent Labs    01/01/17 0626 01/02/17 0851 01/03/17 0624  HGB 9.2* 10.6* 10.3*   Recent Labs    01/02/17 0851 01/03/17 0624  WBC 6.7 6.4  RBC 3.35* 3.23*  HCT 32.0* 30.6*  PLT 220 229   Recent Labs    01/01/17 0626 01/03/17 0624  NA 140 142  K 3.5 3.5  CL 107 106  CO2 28 26  BUN 26* 21*  CREATININE 0.91 0.67  GLUCOSE 88 77  CALCIUM 8.3* 8.3*   No results for input(s): LABPT, INR in the last 72 hours.  Alert and oriented x3. RRR, Lungs clear, BS x4. Left Calf soft and non tender. Banks hip dressing C/D/I. No DVT signs. No signs of infection or compartment syndrome. LLE grossly neurovascularly intact.   Assessment/Plan: 4 Days Post-Op Procedure(s) (LRB): LEFT HIP HEMIARTHROPLASTY  ANTERIOR APPROACH (Left) Up with PT Incentive spirometry SCDs ASA Pain management Medicine following  Benjamin Banks, Benjamin Banks 01/03/2017, 7:58 AM

## 2017-01-03 NOTE — Progress Notes (Signed)
Patient Demographics:    Benjamin Banks, is a 83 y.o. male, DOB - 1925/05/20, ZOX:[id number]  Admit date - 1Jun 23, 202018   Admitting Physician Briscoe Deutscher, MD  Outpatient Primary MD for the patient is Merlene Laughter, MD  LOS - 4  Chief Complaint  Patient presents with  . Hip Pain       Subjective:    Benjamin Banks today has no fevers, no emesis,   patient is agitated and thrashing around, restraints placed  Assessment  & Plan :    Principal Problem:   Closed left hip fracture, initial encounter [facility]) Active Problems:   Essential hypertension   Dementia   Depression   Hypothyroidism   Leukopenia   Thrombocytopenia (HCC)   Hypoxia   Acute lower UTI   Displaced fracture of left femoral neck (HCC)   Brief Narrative: 83 year old male with dementia hypertension, hypothyroidism, chronic iron deficiency anemia admitted from SNF following a mechanical fall and left hip pain noted to have displaced femoral neck fracture on the left, seen by orthopedics, underwent left hip hemiarthroplasty 11/21 postoperatively confused and agitated. Hospitalization complicated by increased delirium and agitation  Assessment & Plan:   #1 closed displaced femoral neck fracture left hip- Secondary to mechanical fall , Status post left hip hemiarthroplasty 12/31/16 by Dr. Samson Frederic -Postoperatively on aspirin 81 mg twice a day per orthopedics for DVT prophylaxis  -Will need SNF for rehabilitation when cognitively better. -PT eval completed  2)  Hypertension- -HCTZ on hold, continue metoprolol 25 mg daily, continue hydralazine when necessary  3)  Hypothyroidism- Continue home dose Synthroid.  4) E coli UTI--Continue IV ceftriaxone (started 12/30/16) per sensitivity report,   5)  Dementia with delirium and agitation- worse, now requiring soft wrist and ankle restraints to avoid patient injury he is newly  surgically repaired left hip, continue as needed lorazepam Haldol, monitor EKG/QT.  Continue scheduled Remeron 15 mg nightly, Zoloft 25 mg daily, risperidone 0.5 mg nightly -With intermittent delirium, agitation -will add low dose Risperidone qpm, continue PRN Haldol, QTc is ok, repeat EKG tomorrow  6)Acute blood loss/postop anemia superimposed on chronic Iron defi anemia-hemoglobin is stable above 10  7)Transient Afib- had transient AFib this admission,  continue Toprol 5 mg daily, currently in sinus rhythm.  Not a great candidate for anticoagulation given advanced age with history of dementia and recurrent falls   DVT prophylaxis:  Aspirin 81 mg twice a day Per orthopedics Code Status: DNR Family Communication: No family at bedside. Disposition Plan: SNF when agitation improved, recommend palliative care follow-up at SNF   Consultants:   Orthopedics: Dr. Ranell Patrick 1Jun 23, 202018  Procedures:   -Left hip hemiarthroplasty 11/20   Antimicrobials:   IV Rocephin 12/30/2016  Lab Results  Component Value Date   PLT 229 01/03/2017    Inpatient Medications  Scheduled Meds: . aspirin EC  81 mg Oral BID WC  . atorvastatin  10 mg Oral q1800  . busPIRone  5 mg Oral BID  . docusate sodium  200 mg Oral QPC breakfast  . feeding supplement  1 Container Oral BID BM  . feeding supplement (PRO-STAT SUGAR FREE 64)  30 mL Oral BID  . levothyroxine  50 mcg Oral QAC breakfast  . metoprolol succinate  25 mg Oral Daily  . mirtazapine  15 mg Oral QHS  . multivitamin with minerals  1 tablet Oral Daily  . risperiDONE  0.5 mg Oral QHS  . sertraline  25 mg Oral Daily   Continuous Infusions: . cefTRIAXone (ROCEPHIN)  IV 1 g (01/03/17 1648)   PRN Meds:.acetaminophen **OR** acetaminophen, bisacodyl, haloperidol lactate, hydrALAZINE, HYDROcodone-acetaminophen, LORazepam, menthol-cetylpyridinium **OR** phenol, morphine injection, ondansetron **OR** ondansetron (ZOFRAN) IV,  senna-docusate    Anti-infectives (From admission, onward)   Start     Dose/Rate Route Frequency Ordered Stop   12/30/16 1830  ceFAZolin (ANCEF) IVPB 2g/100 mL premix     2 g 200 mL/hr over 30 Minutes Intravenous Every 6 hours 12/30/16 1654 12/31/16 0100   12/30/16 1800  cefTRIAXone (ROCEPHIN) 1 g in dextrose 5 % 50 mL IVPB     1 g 100 mL/hr over 30 Minutes Intravenous Every 24 hours 12/30/16 1143     12/30/16 1201  ceFAZolin (ANCEF) 2-4 GM/100ML-% IVPB    Comments:  Wylene SimmerShepherd, Karen   : cabinet override      12/30/16 1201 12/30/16 1303   12/30/16 1145  ceFAZolin (ANCEF) IVPB 2g/100 mL premix     2 g 200 mL/hr over 30 Minutes Intravenous On call to O.R. 12/30/16 0816 12/30/16 1308        Objective:   Vitals:   01/02/17 0653 01/02/17 2306 01/03/17 0636 01/03/17 1351  BP: (!) 145/59 (!) 159/81 (!) 155/77 (!) 151/61  Pulse: (!) 51 70 71 69  Resp:  16 16 20   Temp: 98.3 F (36.8 C) 98.1 F (36.7 C) 97.8 F (36.6 C) 98.1 F (36.7 C)  TempSrc: Oral Oral Oral Axillary  SpO2:  93% 94% 98%  Weight:      Height:        Wt Readings from Last 3 Encounters:  12/30/16 59.2 kg (130 lb 8.2 oz)  07/06/16 68 kg (150 lb)  04/29/16 68.2 kg (150 lb 4.8 oz)     Intake/Output Summary (Last 24 hours) at 01/03/2017 1705 Last data filed at 01/03/2017 1250 Gross per 24 hour  Intake 180 ml  Output 250 ml  Net -70 ml   Physical Exam  Gen:- Awake, agitated and confused HEENT:- [place].AT, No sclera icterus Neck-Supple Neck,No JVD,.  Lungs-  CTAB  CV- S1, S2 normal Abd-  +ve B.Sounds, Abd Soft, No tenderness,    Extremity/Skin:-Left hip postop wound is clean dry and intact Psych-agitated, confused with significant cognitive deficits requiring soft wrist and ankle restraints    Data Review:   Micro Results Recent Results (from the past 240 hour(s))  Surgical PCR screen     Status: None   Collection Time: 12/30/16  4:51 AM  Result Value Ref Range Status   MRSA, PCR NEGATIVE NEGATIVE  Final   Staphylococcus aureus NEGATIVE NEGATIVE Final    Comment: (NOTE) The Xpert SA Assay (FDA approved for NASAL specimens in patients 81 years of age and older), is one component of a comprehensive surveillance program. It is not intended to diagnose infection nor to guide or monitor treatment.   Culture, Urine     Status: Abnormal   Collection Time: 12/30/16  8:51 AM  Result Value Ref Range Status   Specimen Description URINE, CLEAN CATCH  Final   Special Requests NONE  Final   Culture >=100,000 COLONIES/mL ESCHERICHIA COLI (A)  Final   Report Status 01/01/2017 FINAL  Final   Organism ID, Bacteria ESCHERICHIA COLI (A)  Final  Susceptibility   Escherichia coli - MIC*    AMPICILLIN >=32 RESISTANT Resistant     CEFAZOLIN >=64 RESISTANT Resistant     CEFTRIAXONE <=1 SENSITIVE Sensitive     CIPROFLOXACIN >=4 RESISTANT Resistant     GENTAMICIN <=1 SENSITIVE Sensitive     IMIPENEM <=0.25 SENSITIVE Sensitive     NITROFURANTOIN <=16 SENSITIVE Sensitive     TRIMETH/SULFA <=20 SENSITIVE Sensitive     AMPICILLIN/SULBACTAM >=32 RESISTANT Resistant     PIP/TAZO 64 INTERMEDIATE Intermediate     Extended ESBL NEGATIVE Sensitive     * >=100,000 COLONIES/mL ESCHERICHIA COLI    Radiology Reports Dg Chest 1 View  Result Date: 12/30/2016 CLINICAL DATA:  Initial evaluation for preoperative evaluation, left hip fracture. EXAM: CHEST 1 VIEW COMPARISON:  Prior radiograph from 04/28/2016. FINDINGS: Mild cardiomegaly, stable. Mediastinal silhouette within normal limits. Aortic atherosclerosis. Lungs hypoinflated. No focal infiltrates. No pulmonary edema or pleural effusion. No pneumothorax. No acute osseous abnormality. IMPRESSION: 1. Shallow lung inflation with no active cardiopulmonary disease identified. 2. Mild cardiomegaly, stable. Electronically Signed   By: Rise MuBenjamin  McClintock M.D.   On: 12/30/2016 00:10   Pelvis Portable  Result Date: 12/30/2016 CLINICAL DATA:  Post LEFT hip  arthroplasty EXAM: PORTABLE PELVIS 1-2 VIEWS COMPARISON:  Portable exam 1553 hours compared to intraoperative images of 12/30/2016 FINDINGS: LEFT hip prosthesis identified in expected position. No definite fracture or dislocation. Bones appear demineralized. Scattered soft tissue gas. Catheter within urinary bladder. IMPRESSION: LEFT hip prosthesis without acute complication. Electronically Signed   By: Ulyses SouthwardMark  Boles M.D.   On: 12/30/2016 16:20   Dg Knee Left Port  Result Date: 12/30/2016 CLINICAL DATA:  Fall yesterday.  Left knee pain.  Initial encounter. EXAM: PORTABLE LEFT KNEE - 1-2 VIEW COMPARISON:  None. FINDINGS: No evidence of fracture, dislocation, or joint effusion. 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 Mild degenerative spurring of patella. Mild chondrocalcinosis also noted. Peripheral vascular calcification. IMPRESSION: No acute findings. Severe medial compartment osteoarthritis, with tibia varus. Electronically Signed   By: Myles RosenthalJohn  Stahl M.D.   On: 12/30/2016 08:49   Dg C-arm 61-120 Min-no Report  Result Date: 12/30/2016 Fluoroscopy was utilized by the requesting physician.  No radiographic interpretation.   Dg Hip Operative Unilat W Or W/o Pelvis Left  Result Date: 12/30/2016 CLINICAL DATA:  Status post total hip replacement on the left EXAM: OPERATIVE LEFT HIP 1 VIEW TECHNIQUE: Fluoroscopic spot image(s) were submitted for interpretation post-operatively. FLUOROSCOPY TIME:  0 minutes 18 seconds; 1.75 mGy; 2 acquired images COMPARISON:  December 29, 2016 FINDINGS: There is a total hip replacement on the left with prosthetic components well-seated. No fracture or dislocation. Visualized right hip joint appears normal. IMPRESSION: Prosthetic components for total hip replacement on the left appear well seated. No fracture or dislocation evident. Electronically Signed   By: Bretta BangWilliam  Woodruff III M.D.   On: 12/30/2016 14:40   Dg Hip Unilat With Pelvis 2-3 Views  Left  Result Date: 109/13/202018 CLINICAL DATA:  Left hip pain after a fall this evening. EXAM: DG HIP (WITH OR WITHOUT PELVIS) 2-3V LEFT COMPARISON:  None. FINDINGS: Transverse fracture of the left femoral neck with superior subluxation of the distal fracture fragment resulting in varus angulation. No dislocation. No definite inter trochanteric extension although rotation limits evaluation. Pelvis appears intact. SI joints and symphysis pubis are not displaced. Degenerative changes in the lower lumbar spine. IMPRESSION: Transverse fracture left femoral neck with varus angulation. No definite trochanteric extension although rotation limits evaluation. Electronically Signed  By: Burman Nieves M.D.   On: 12020/06/416 22:23     CBC Recent Labs  Lab 12/29/16 2243 12/30/16 0611 12/30/16 0901 12/31/16 0602 01/01/17 0626 01/02/17 0851 01/03/17 0624  WBC 3.8* QUESTIONABLE RESULTS, RECOMMEND RECOLLECT TO VERIFY 13.8* 9.9 7.2 6.7 6.4  HGB 18.6* QUESTIONABLE RESULTS, RECOMMEND RECOLLECT TO VERIFY 10.6* 9.5* 9.2* 10.6* 10.3*  HCT 53.4* QUESTIONABLE RESULTS, RECOMMEND RECOLLECT TO VERIFY 32.1* 28.9* 28.2* 32.0* 30.6*  PLT 126* QUESTIONABLE RESULTS, RECOMMEND RECOLLECT TO VERIFY 227 195 193 220 229  MCV 94.8 QUESTIONABLE RESULTS, RECOMMEND RECOLLECT TO VERIFY 96.7 98.0 96.9 95.5 94.7  MCH 33.0 QUESTIONABLE RESULTS, RECOMMEND RECOLLECT TO VERIFY 31.9 32.2 31.6 31.6 31.9  MCHC 34.8 QUESTIONABLE RESULTS, RECOMMEND RECOLLECT TO VERIFY 33.0 32.9 32.6 33.1 33.7  RDW 13.6 QUESTIONABLE RESULTS, RECOMMEND RECOLLECT TO VERIFY 13.9 14.4 14.3 14.0 13.6  LYMPHSABS 0.2* QUESTIONABLE RESULTS, RECOMMEND RECOLLECT TO VERIFY 0.6* 0.9  --   --   --   MONOABS 0.0* QUESTIONABLE RESULTS, RECOMMEND RECOLLECT TO VERIFY 0.7 0.7  --   --   --   EOSABS 0.0 QUESTIONABLE RESULTS, RECOMMEND RECOLLECT TO VERIFY 0.0 0.1  --   --   --   BASOSABS 0.0 QUESTIONABLE RESULTS, RECOMMEND RECOLLECT TO VERIFY 0.0 0.0  --   --   --      Chemistries  Recent Labs  Lab 12/30/16 0611 12/30/16 0901 12/31/16 0602 01/01/17 0626 01/03/17 0624  NA QUESTIONABLE RESULTS, RECOMMEND RECOLLECT TO VERIFY 138 138 140 142  K QUESTIONABLE RESULTS, RECOMMEND RECOLLECT TO VERIFY 4.2 3.8 3.5 3.5  CL QUESTIONABLE RESULTS, RECOMMEND RECOLLECT TO VERIFY 104 107 107 106  CO2 QUESTIONABLE RESULTS, RECOMMEND RECOLLECT TO VERIFY 28 27 28 26   GLUCOSE QUESTIONABLE RESULTS, RECOMMEND RECOLLECT TO VERIFY 125* 102* 88 77  BUN QUESTIONABLE RESULTS, RECOMMEND RECOLLECT TO VERIFY 33* 32* 26* 21*  CREATININE QUESTIONABLE RESULTS, RECOMMEND RECOLLECT TO VERIFY 1.07 0.91 0.91 0.67  CALCIUM QUESTIONABLE RESULTS, RECOMMEND RECOLLECT TO VERIFY 8.5* 8.1* 8.3* 8.3*  MG  --   --  2.0  --   --    ------------------------------------------------------------------------------------------------------------------ No results for input(s): CHOL, HDL, LDLCALC, TRIG, CHOLHDL, LDLDIRECT in the last 72 hours.  No results found for: HGBA1C ------------------------------------------------------------------------------------------------------------------ No results for input(s): TSH, T4TOTAL, T3FREE, THYROIDAB in the last 72 hours.  Invalid input(s): FREET3 ------------------------------------------------------------------------------------------------------------------ No results for input(s): VITAMINB12, FOLATE, FERRITIN, TIBC, IRON, RETICCTPCT in the last 72 hours.  Coagulation profile Recent Labs  Lab 12/29/16 2243  INR 1.16    No results for input(s): DDIMER in the last 72 hours.  Cardiac Enzymes No results for input(s): CKMB, TROPONINI, MYOGLOBIN in the last 168 hours.  Invalid input(s): CK ------------------------------------------------------------------------------------------------------------------ No results found for: BNP   Shon Hale M.D on 01/03/2017 at 5:05 PM  Between 7am to 7pm - Pager - [phone]  After 7pm go to  [email or web address] - password [email or web address]  [facility] -  Office  [phone]   Voice Recognition Reubin [place] dictation system was used to create this note, attempts have been made to correct errors. Please contact the author with questions and/or clarifications.

## 2017-01-03 NOTE — Progress Notes (Signed)
Severe agitation/restlessness bilateral soft wrist/ankle restraints initiated.

## 2017-01-04 NOTE — Progress Notes (Signed)
Patient Demographics:    Benjamin Banks, is a 83 y.o. male, DOB - [birth year], WUJ:[id number]  Admit date - 12020-01-117   Admitting Physician Briscoe Deutscherimothy S Opyd, MD  Outpatient Primary MD for the patient is Merlene LaughterStoneking, Hal, MD  LOS - 5  Chief Complaint  Patient presents with  . Hip Pain       Subjective:    Benjamin Banks today has no fevers, no emesis,   Less agitated, confusion persist   Assessment  & Plan :    Principal Problem:   Closed left hip fracture, initial encounter [facility](HCC) Active Problems:   Essential hypertension   Dementia   Depression   Hypothyroidism   Leukopenia   Thrombocytopenia (HCC)   Hypoxia   Acute lower UTI   Displaced fracture of left femoral neck (HCC)   Brief Narrative: 83 year old male with dementia hypertension, hypothyroidism, chronic iron deficiency anemia admitted from SNF following a mechanical fall and left hip pain noted to have displaced femoral neck fracture on the left, seen by orthopedics, underwent left hip hemiarthroplasty 11/21 postoperatively confused and agitated. Hospitalization complicated by increased delirium and agitation  Assessment & Plan:   1) Closed Displaced Femoral Neck Fracture Left Hip- Secondary to mechanical fall , Status post left hip hemiarthroplasty 12/31/16 by Dr. Samson FredericBrian Swinteck, -Postoperatively on aspirin 81 mg twice a day per orthopedics for DVT prophylaxis . -Will need SNF for rehabilitation when cognitively better. -PT eval completed  2)Hypertension- -HCTZ on hold, continue metoprolol 25 mg daily, continue hydralazine when necessary  3)Hypothyroidism- Continue home dose Synthroid.  4) E coli UTI--Complete IV ceftriaxone (last dose 01/04/17) per sensitivity report,   5)Dementia with Delirium and Agitation- Patient with postop/hospital psychosis and delirium superimposed on underlying dementia, improving slowly, still requiring  soft wrist and ankle restraints to avoid  injury to his newly surgically repaired left hip, continue as needed lorazepam Haldol, monitor EKG/QT.  Continue scheduled Remeron 15 mg nightly, Zoloft 25 mg daily, risperidone 0.5 mg nightly  6)Acute blood Loss/postop anemia superimposed on chronic Iron defi anemia-hemoglobin is stable above 10  7)Transient Afib- had transient AFib this admission,  continue Toprol 25 mg daily, currently in sinus rhythm.  Not a great candidate for anticoagulation given advanced age with history of dementia and recurrent falls   DVT prophylaxis:  Aspirin 81 mg twice a day Per orthopedics Code Status: DNR Family Communication: No family at bedside. Disposition Plan: SNF when agitation improved, recommend palliative care follow-up at SNF   Consultants:   Orthopedics: Dr. Ranell PatrickNorris 12020-01-117  Procedures:   -Left hip hemiarthroplasty 11/20   Antimicrobials:   IV Rocephin last dose 01/04/2017  Lab Results  Component Value Date   PLT 229 01/03/2017    Inpatient Medications  Scheduled Meds: . aspirin EC  81 mg Oral BID WC  . atorvastatin  10 mg Oral q1800  . busPIRone  5 mg Oral BID  . docusate sodium  200 mg Oral QPC breakfast  . feeding supplement  1 Container Oral BID BM  . feeding supplement (PRO-STAT SUGAR FREE 64)  30 mL Oral BID  . levothyroxine  50 mcg Oral QAC breakfast  . metoprolol succinate  25 mg Oral Daily  . mirtazapine  15 mg Oral QHS  .  multivitamin with minerals  1 tablet Oral Daily  . risperiDONE  0.5 mg Oral QHS  . sertraline  25 mg Oral Daily   Continuous Infusions: . cefTRIAXone (ROCEPHIN)  IV Stopped (01/03/17 1718)   PRN Meds:.acetaminophen **OR** acetaminophen, bisacodyl, haloperidol lactate, hydrALAZINE, HYDROcodone-acetaminophen, LORazepam, menthol-cetylpyridinium **OR** phenol, morphine injection, ondansetron **OR** ondansetron (ZOFRAN) IV, senna-docusate    Anti-infectives (From admission, onward)   Start      Dose/Rate Route Frequency Ordered Stop   12/30/16 1830  ceFAZolin (ANCEF) IVPB 2g/100 mL premix     2 g 200 mL/hr over 30 Minutes Intravenous Every 6 hours 12/30/16 1654 12/31/16 0100   12/30/16 1800  cefTRIAXone (ROCEPHIN) 1 g in dextrose 5 % 50 mL IVPB     1 g 100 mL/hr over 30 Minutes Intravenous Every 24 hours 12/30/16 1143 01/05/17 1759   12/30/16 1201  ceFAZolin (ANCEF) 2-4 GM/100ML-% IVPB    Comments:  Wylene SimmerShepherd, Karen   : cabinet override      12/30/16 1201 12/30/16 1303   12/30/16 1145  ceFAZolin (ANCEF) IVPB 2g/100 mL premix     2 g 200 mL/hr over 30 Minutes Intravenous On call to O.R. 12/30/16 0816 12/30/16 1308        Objective:   Vitals:   01/03/17 2149 01/03/17 2200 01/04/17 0547 01/04/17 1447  BP: (!) 159/102 (!) 150/82 (!) 125/56 (!) 148/79  Pulse: 95  (!) 55 75  Resp: 20  16 18   Temp: 100.1 F (37.8 C)  98 F (36.7 C) 98 F (36.7 C)  TempSrc: Axillary  Oral Oral  SpO2: 96%  96% 98%  Weight:      Height:        Wt Readings from Last 3 Encounters:  12/30/16 59.2 kg (130 lb 8.2 oz)  07/06/16 68 kg (150 lb)  04/29/16 68.2 kg (150 lb 4.8 oz)     Intake/Output Summary (Last 24 hours) at 01/04/2017 1633 Last data filed at 01/04/2017 1300 Gross per 24 hour  Intake 585 ml  Output -  Net 585 ml   Physical Exam  Gen:- Awake, agitated and confused HEENT:- [place].AT, No sclera icterus Neck-Supple Neck,No JVD,.  Lungs-  CTAB  CV- S1, S2 normal Abd-  +ve B.Sounds, Abd Soft, No tenderness,    Extremity/Skin:-Left hip postop wound is clean dry and intact Psych- less agitated, less confused,  significant cognitive deficits requiring soft wrist and ankle restraints    Data Review:   Micro Results Recent Results (from the past 240 hour(s))  Surgical PCR screen     Status: None   Collection Time: 12/30/16  4:51 AM  Result Value Ref Range Status   MRSA, PCR NEGATIVE NEGATIVE Final   Staphylococcus aureus NEGATIVE NEGATIVE Final    Comment: (NOTE) The Xpert  SA Assay (FDA approved for NASAL specimens in patients 81 years of age and older), is one component of a comprehensive surveillance program. It is not intended to diagnose infection nor to guide or monitor treatment.   Culture, Urine     Status: Abnormal   Collection Time: 12/30/16  8:51 AM  Result Value Ref Range Status   Specimen Description URINE, CLEAN CATCH  Final   Special Requests NONE  Final   Culture >=100,000 COLONIES/mL ESCHERICHIA COLI (A)  Final   Report Status 01/01/2017 FINAL  Final   Organism ID, Bacteria ESCHERICHIA COLI (A)  Final      Susceptibility   Escherichia coli - MIC*    AMPICILLIN >=32 RESISTANT Resistant  CEFAZOLIN >=64 RESISTANT Resistant     CEFTRIAXONE <=1 SENSITIVE Sensitive     CIPROFLOXACIN >=4 RESISTANT Resistant     GENTAMICIN <=1 SENSITIVE Sensitive     IMIPENEM <=0.25 SENSITIVE Sensitive     NITROFURANTOIN <=16 SENSITIVE Sensitive     TRIMETH/SULFA <=20 SENSITIVE Sensitive     AMPICILLIN/SULBACTAM >=32 RESISTANT Resistant     PIP/TAZO 64 INTERMEDIATE Intermediate     Extended ESBL NEGATIVE Sensitive     * >=100,000 COLONIES/mL ESCHERICHIA COLI    Radiology Reports Dg Chest 1 View  Result Date: 12/30/2016 CLINICAL DATA:  Initial evaluation for preoperative evaluation, left hip fracture. EXAM: CHEST 1 VIEW COMPARISON:  Prior radiograph from 04/28/2016. FINDINGS: Mild cardiomegaly, stable. Mediastinal silhouette within normal limits. Aortic atherosclerosis. Lungs hypoinflated. No focal infiltrates. No pulmonary edema or pleural effusion. No pneumothorax. No acute osseous abnormality. IMPRESSION: 1. Shallow lung inflation with no active cardiopulmonary disease identified. 2. Mild cardiomegaly, stable. Electronically Signed   By: Rise Mu M.D.   On: 12/30/2016 00:10   Pelvis Portable  Result Date: 12/30/2016 CLINICAL DATA:  Post LEFT hip arthroplasty EXAM: PORTABLE PELVIS 1-2 VIEWS COMPARISON:  Portable exam 1553 hours compared to  intraoperative images of 12/30/2016 FINDINGS: LEFT hip prosthesis identified in expected position. No definite fracture or dislocation. Bones appear demineralized. Scattered soft tissue gas. Catheter within urinary bladder. IMPRESSION: LEFT hip prosthesis without acute complication. Electronically Signed   By: Ulyses Southward M.D.   On: 12/30/2016 16:20   Dg Knee Left Port  Result Date: 12/30/2016 CLINICAL DATA:  Fall yesterday.  Left knee pain.  Initial encounter. EXAM: PORTABLE LEFT KNEE - 1-2 VIEW COMPARISON:  None. FINDINGS: No evidence of fracture, dislocation, or joint effusion. 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 Mild degenerative spurring of patella. Mild chondrocalcinosis also noted. Peripheral vascular calcification. IMPRESSION: No acute findings. Severe medial compartment osteoarthritis, with tibia varus. Electronically Signed   By: Myles Rosenthal M.D.   On: 12/30/2016 08:49   Dg C-arm 61-120 Min-no Report  Result Date: 12/30/2016 Fluoroscopy was utilized by the requesting physician.  No radiographic interpretation.   Dg Hip Operative Unilat W Or W/o Pelvis Left  Result Date: 12/30/2016 CLINICAL DATA:  Status post total hip replacement on the left EXAM: OPERATIVE LEFT HIP 1 VIEW TECHNIQUE: Fluoroscopic spot image(s) were submitted for interpretation post-operatively. FLUOROSCOPY TIME:  0 minutes 18 seconds; 1.75 mGy; 2 acquired images COMPARISON:  December 29, 2016 FINDINGS: There is a total hip replacement on the left with prosthetic components well-seated. No fracture or dislocation. Visualized right hip joint appears normal. IMPRESSION: Prosthetic components for total hip replacement on the left appear well seated. No fracture or dislocation evident. Electronically Signed   By: Bretta Bang III M.D.   On: 12/30/2016 14:40   Dg Hip Unilat With Pelvis 2-3 Views Left  Result Date: 102/04/202018 CLINICAL DATA:  Left hip pain after a fall this evening. EXAM: DG HIP  (WITH OR WITHOUT PELVIS) 2-3V LEFT COMPARISON:  None. FINDINGS: Transverse fracture of the left femoral neck with superior subluxation of the distal fracture fragment resulting in varus angulation. No dislocation. No definite inter trochanteric extension although rotation limits evaluation. Pelvis appears intact. SI joints and symphysis pubis are not displaced. Degenerative changes in the lower lumbar spine. IMPRESSION: Transverse fracture left femoral neck with varus angulation. No definite trochanteric extension although rotation limits evaluation. Electronically Signed   By: Burman Nieves M.D.   On: 102/04/202018 22:23     CBC Recent  Labs  Lab 12/29/16 2243 12/30/16 1610 12/30/16 0901 12/31/16 0602 01/01/17 0626 01/02/17 0851 01/03/17 0624  WBC 3.8* QUESTIONABLE RESULTS, RECOMMEND RECOLLECT TO VERIFY 13.8* 9.9 7.2 6.7 6.4  HGB 18.6* QUESTIONABLE RESULTS, RECOMMEND RECOLLECT TO VERIFY 10.6* 9.5* 9.2* 10.6* 10.3*  HCT 53.4* QUESTIONABLE RESULTS, RECOMMEND RECOLLECT TO VERIFY 32.1* 28.9* 28.2* 32.0* 30.6*  PLT 126* QUESTIONABLE RESULTS, RECOMMEND RECOLLECT TO VERIFY 227 195 193 220 229  MCV 94.8 QUESTIONABLE RESULTS, RECOMMEND RECOLLECT TO VERIFY 96.7 98.0 96.9 95.5 94.7  MCH 33.0 QUESTIONABLE RESULTS, RECOMMEND RECOLLECT TO VERIFY 31.9 32.2 31.6 31.6 31.9  MCHC 34.8 QUESTIONABLE RESULTS, RECOMMEND RECOLLECT TO VERIFY 33.0 32.9 32.6 33.1 33.7  RDW 13.6 QUESTIONABLE RESULTS, RECOMMEND RECOLLECT TO VERIFY 13.9 14.4 14.3 14.0 13.6  LYMPHSABS 0.2* QUESTIONABLE RESULTS, RECOMMEND RECOLLECT TO VERIFY 0.6* 0.9  --   --   --   MONOABS 0.0* QUESTIONABLE RESULTS, RECOMMEND RECOLLECT TO VERIFY 0.7 0.7  --   --   --   EOSABS 0.0 QUESTIONABLE RESULTS, RECOMMEND RECOLLECT TO VERIFY 0.0 0.1  --   --   --   BASOSABS 0.0 QUESTIONABLE RESULTS, RECOMMEND RECOLLECT TO VERIFY 0.0 0.0  --   --   --     Chemistries  Recent Labs  Lab 12/30/16 0611 12/30/16 0901 12/31/16 0602 01/01/17 0626 01/03/17 0624    NA QUESTIONABLE RESULTS, RECOMMEND RECOLLECT TO VERIFY 138 138 140 142  K QUESTIONABLE RESULTS, RECOMMEND RECOLLECT TO VERIFY 4.2 3.8 3.5 3.5  CL QUESTIONABLE RESULTS, RECOMMEND RECOLLECT TO VERIFY 104 107 107 106  CO2 QUESTIONABLE RESULTS, RECOMMEND RECOLLECT TO VERIFY 28 27 28 26   GLUCOSE QUESTIONABLE RESULTS, RECOMMEND RECOLLECT TO VERIFY 125* 102* 88 77  BUN QUESTIONABLE RESULTS, RECOMMEND RECOLLECT TO VERIFY 33* 32* 26* 21*  CREATININE QUESTIONABLE RESULTS, RECOMMEND RECOLLECT TO VERIFY 1.07 0.91 0.91 0.67  CALCIUM QUESTIONABLE RESULTS, RECOMMEND RECOLLECT TO VERIFY 8.5* 8.1* 8.3* 8.3*  MG  --   --  2.0  --   --    ------------------------------------------------------------------------------------------------------------------ No results for input(s): CHOL, HDL, LDLCALC, TRIG, CHOLHDL, LDLDIRECT in the last 72 hours.  No results found for: HGBA1C ------------------------------------------------------------------------------------------------------------------ No results for input(s): TSH, T4TOTAL, T3FREE, THYROIDAB in the last 72 hours.  Invalid input(s): FREET3 ------------------------------------------------------------------------------------------------------------------ No results for input(s): VITAMINB12, FOLATE, FERRITIN, TIBC, IRON, RETICCTPCT in the last 72 hours.  Coagulation profile Recent Labs  Lab 12/29/16 2243  INR 1.16    No results for input(s): DDIMER in the last 72 hours.  Cardiac Enzymes No results for input(s): CKMB, TROPONINI, MYOGLOBIN in the last 168 hours.  Invalid input(s): CK ------------------------------------------------------------------------------------------------------------------ No results found for: BNP   Shon Hale M.D on 01/04/2017 at 4:33 PM  Between 7am to 7pm - Pager - [phone]  After 7pm go to [email or web address] - password [email or web address]  [facility] -  Office  [phone]   Voice Recognition Reubin Milan dictation system  was used to create this note, attempts have been made to correct errors. Please contact the author with questions and/or clarifications.

## 2017-01-04 NOTE — Progress Notes (Signed)
Benjamin Banks  MRN: [id number] DOB/Age: [birth year] 83 y.o. [facility] Procedure: Procedure(s) (LRB): LEFT HIP HEMIARTHROPLASTY  ANTERIOR APPROACH (Left)     Subjective: Currently restrained due to delirium. States he needs help because he is "locked in "  Vital Signs Temp:  [98 F (36.7 C)-100.1 F (37.8 C)] 98 F (36.7 C) (11/25 0547) Pulse Rate:  [55-95] 55 (11/25 0547) Resp:  [16-20] 16 (11/25 0547) BP: (125-159)/(56-102) 125/56 (11/25 0547) SpO2:  [96 %-98 %] 96 % (11/25 0547)  Lab Results Recent Labs    01/02/17 0851 01/03/17 0624  WBC 6.7 6.4  HGB 10.6* 10.3*  HCT 32.0* 30.6*  PLT 220 229   BMET Recent Labs    01/03/17 0624  NA 142  K 3.5  CL 106  CO2 26  GLUCOSE 77  BUN 21*  CREATININE 0.67  CALCIUM 8.3*   INR  Date Value Ref Range Status  1January 10, 202018 1.16  Final     Exam Left hip dressing dry. Moves foot on command        Plan Continue care per medicine. No orthopedic needs currently  [facility],Benjamin Borchardt PA-C  01/04/2017, 12:27 PM Contact # [phone]

## 2017-01-05 DIAGNOSIS — S72002A Fracture of unspecified part of neck of left femur, initial encounter for closed fracture: Secondary | ICD-10-CM

## 2017-01-05 LAB — CBC
HCT: 27.5 % — ABNORMAL LOW (ref 39.0–52.0)
Hemoglobin: 9.2 g/dL — ABNORMAL LOW (ref 13.0–17.0)
MCH: 32.2 pg (ref 26.0–34.0)
MCHC: 33.5 g/dL (ref 30.0–36.0)
MCV: 96.2 fL (ref 78.0–100.0)
PLATELETS: 262 10*3/uL (ref 150–400)
RBC: 2.86 MIL/uL — AB (ref 4.22–5.81)
RDW: 13.9 % (ref 11.5–15.5)
WBC: 5.4 10*3/uL (ref 4.0–10.5)

## 2017-01-05 LAB — BASIC METABOLIC PANEL
ANION GAP: 6 (ref 5–15)
BUN: 23 mg/dL — ABNORMAL HIGH (ref 6–20)
CALCIUM: 8.3 mg/dL — AB (ref 8.9–10.3)
CO2: 27 mmol/L (ref 22–32)
Chloride: 112 mmol/L — ABNORMAL HIGH (ref 101–111)
Creatinine, Ser: 0.89 mg/dL (ref 0.61–1.24)
Glucose, Bld: 90 mg/dL (ref 65–99)
POTASSIUM: 3.4 mmol/L — AB (ref 3.5–5.1)
SODIUM: 145 mmol/L (ref 135–145)

## 2017-01-05 MED ORDER — POTASSIUM CHLORIDE CRYS ER 20 MEQ PO TBCR
40.0000 meq | EXTENDED_RELEASE_TABLET | Freq: Once | ORAL | Status: AC
  Administered 2017-01-05: 40 meq via ORAL
  Filled 2017-01-05: qty 2

## 2017-01-05 MED ORDER — DEXTROSE 5 % IV SOLN: 1.0000 g | INTRAVENOUS | Status: DC

## 2017-01-05 NOTE — Progress Notes (Signed)
Patient is in bed with soft no violent restrain as MD ordered due to very agitated, restless. RN assessed patient, offered fluid and food.

## 2017-01-05 NOTE — Progress Notes (Signed)
PT Cancellation Note  Patient Details Name: Benjamin Banks MRN: [id number] DOB: [birth year]   Cancelled Treatment:    Reason Eval/Treat Not Completed: Other (comment)(patient in restraints for agitation. )   Rada HayHill, Kaiven Vester Elizabeth 01/05/2017, 11:26 AM  Blanchard KelchKaren Deslyn Cavenaugh PT [phone]

## 2017-01-05 NOTE — Progress Notes (Signed)
OT Cancellation Note  Patient Details Name: Dawna PartLardric Jr Sisemore MRN: [id number] DOB: 01/15/1926   Cancelled Treatment:    Reason Eval/Treat Not Completed: Other (comment) patient in restraints for agitation. )    Ajaya Crutchfield, Metro KungLorraine D 01/05/2017, 11:58 AM

## 2017-01-05 NOTE — Progress Notes (Signed)
Redness noted on bilateral ankles from patient pulling at restraints. Restrains have been reapplied with mepilex bandages to relieve friction.

## 2017-01-05 NOTE — Progress Notes (Signed)
Patient ID: Benjamin Banks Havrilla, male   DOB: [birth year], 83 y.o.   MRN: [id number]  PROGRESS NOTE    Benjamin Banks Soller  WUJ:[id number] DOB: [birth year] DOA: 118-Mar-202018  PCP: Merlene LaughterStoneking, Hal, MD   Brief Narrative:  83 year old male with hypothyroidism, dementia who presented to ED status post fall and has sustained left hip fracture. She underwent left hip hemiarthroplasty 12/31/2016 and developed post-operative psychosis and delirium.  Assessment & Plan:   Principal Problem:   Closed left hip fracture, initial encounter (HCC) / Displaced fracture of left femoral neck (HCC) - S/P mechanical fall - S/P left hip hemiarthroplasty 12/31/2016 by Dr. Samson FredericBrian Swinteck - Continue aspirin   Active Problems:   Essential hypertension - Continue metoprolol     Hypokalemia - Supplemented - Follow up BMP in am    Dementia with delirium / Depression - Has had postoperative psychosis - Continue to monitor mental status  - Continue Buspar, remeron    Hypothyroidism - Continue synthroid     Leukopenia / Thrombocytopenia (HCC) - WBC and platelets are now WNL    Acute lower UTI due to E.Coli - Completed Rocephin     Acute postoperative blood loss anemia - Hgb stable      Transient A fib - Continue metoprolol  - Continue aspirin     Moderate protein calorie malnutrition - Continue nutritional supplementation     DVT prophylaxis: SCD's Code Status: DNR/DNI Family Communication: no family at the bedside  Disposition Plan: to SNF once stable    Consultants:   Ortho, Dr. Ranell PatrickNorris   Procedures:   Left hip hemiarthroplasty 12/30/2016  Antimicrobials:   Rocephin    Subjective: No overnight events.  Objective: Vitals:   01/04/17 2049 01/05/17 0610 01/05/17 0616 01/05/17 0855  BP: (!) 149/82 (!) 187/75 (!) 142/64   Pulse: 63 (!) 59  62  Resp: 18 18    Temp: 98 F (36.7 C) 98.2 F (36.8 C)    TempSrc: Axillary Axillary    SpO2: 100% 100%    Weight:      Height:          Intake/Output Summary (Last 24 hours) at 01/05/2017 1151 Last data filed at 01/05/2017 1000 Gross per 24 hour  Intake 545 ml  Output 150 ml  Net 395 ml   Filed Weights   12/30/16 0351  Weight: 59.2 kg (130 lb 8.2 oz)    Examination:  General exam: Appears in no acute distress  Respiratory system: Clear to auscultation. Respiratory effort normal. Cardiovascular system: S1 & S2 heard, Rate controlled  Gastrointestinal system: Abdomen is nondistended, soft and nontender. No organomegaly or masses felt. Normal bowel sounds heard. Central nervous system: has dementia, psychosis Extremities: palpable pulses Skin: warm, dry Psychiatry: little restless, not agitated   Data Reviewed: I have personally reviewed following labs and imaging studies  CBC: Recent Labs  Lab 12/29/16 2243 12/30/16 0901 12/31/16 0602 01/01/17 0626 01/02/17 0851 01/03/17 0624 01/05/17 0512  WBC 3.8* 13.8* 9.9 7.2 6.7 6.4 5.4  NEUTROABS 3.5 12.5* 8.3*  --   --   --   --   HGB 18.6* 10.6* 9.5* 9.2* 10.6* 10.3* 9.2*  HCT 53.4* 32.1* 28.9* 28.2* 32.0* 30.6* 27.5*  MCV 94.8 96.7 98.0 96.9 95.5 94.7 96.2  PLT 126* 227 195 193 220 229 262   Basic Metabolic Panel: Recent Labs  Lab 12/30/16 0901 12/31/16 0602 01/01/17 0626 01/03/17 0624 01/05/17 0512  NA 138 138 140 142 145  K 4.2  3.8 3.5 3.5 3.4*  CL 104 107 107 106 112*  CO2 28 27 28 26 27   GLUCOSE 125* 102* 88 77 90  BUN 33* 32* 26* 21* 23*  CREATININE 1.07 0.91 0.91 0.67 0.89  CALCIUM 8.5* 8.1* 8.3* 8.3* 8.3*  MG  --  2.0  --   --   --    GFR: Estimated Creatinine Clearance: 48 mL/min (by C-G formula based on SCr of 0.89 mg/dL). Liver Function Tests: No results for input(s): AST, ALT, ALKPHOS, BILITOT, PROT, ALBUMIN in the last 168 hours. No results for input(s): LIPASE, AMYLASE in the last 168 hours. No results for input(s): AMMONIA in the last 168 hours. Coagulation Profile: Recent Labs  Lab 12/29/16 2243  INR 1.16   Cardiac  Enzymes: No results for input(s): CKTOTAL, CKMB, CKMBINDEX, TROPONINI in the last 168 hours. BNP (last 3 results) No results for input(s): PROBNP in the last 8760 hours. HbA1C: No results for input(s): HGBA1C in the last 72 hours. CBG: No results for input(s): GLUCAP in the last 168 hours. Lipid Profile: No results for input(s): CHOL, HDL, LDLCALC, TRIG, CHOLHDL, LDLDIRECT in the last 72 hours. Thyroid Function Tests: No results for input(s): TSH, T4TOTAL, FREET4, T3FREE, THYROIDAB in the last 72 hours. Anemia Panel: No results for input(s): VITAMINB12, FOLATE, FERRITIN, TIBC, IRON, RETICCTPCT in the last 72 hours. Urine analysis:    Component Value Date/Time   COLORURINE YELLOW 12/30/2016 0830   APPEARANCEUR HAZY (A) 12/30/2016 0830   LABSPEC 1.019 12/30/2016 0830   PHURINE 5.0 12/30/2016 0830   GLUCOSEU NEGATIVE 12/30/2016 0830   HGBUR MODERATE (A) 12/30/2016 0830   BILIRUBINUR NEGATIVE 12/30/2016 0830   KETONESUR NEGATIVE 12/30/2016 0830   PROTEINUR NEGATIVE 12/30/2016 0830   NITRITE POSITIVE (A) 12/30/2016 0830   LEUKOCYTESUR LARGE (A) 12/30/2016 0830   Sepsis Labs: @LABRCNTIP (procalcitonin:4,lacticidven:4)    Surgical PCR screen     Status: None   Collection Time: 12/30/16  4:51 AM  Result Value Ref Range Status   MRSA, PCR NEGATIVE NEGATIVE Final   Staphylococcus aureus NEGATIVE NEGATIVE Final  Culture, Urine     Status: Abnormal   Collection Time: 12/30/16  8:51 AM  Result Value Ref Range Status   Specimen Description URINE, CLEAN CATCH  Final   Special Requests NONE  Final   Report Status 01/01/2017 FINAL  Final   Organism ID, Bacteria ESCHERICHIA COLI (A)  Final      Susceptibility   Escherichia coli - MIC*    AMPICILLIN >=32 RESISTANT Resistant     CEFAZOLIN >=64 RESISTANT Resistant     CEFTRIAXONE <=1 SENSITIVE Sensitive     CIPROFLOXACIN >=4 RESISTANT Resistant     GENTAMICIN <=1 SENSITIVE Sensitive     IMIPENEM <=0.25 SENSITIVE Sensitive      NITROFURANTOIN <=16 SENSITIVE Sensitive     TRIMETH/SULFA <=20 SENSITIVE Sensitive     AMPICILLIN/SULBACTAM >=32 RESISTANT Resistant     PIP/TAZO 64 INTERMEDIATE Intermediate     Extended ESBL NEGATIVE Sensitive     * >=100,000 COLONIES/mL ESCHERICHIA COLI      Radiology Studies: No results found.   Scheduled Meds: . aspirin EC  81 mg Oral BID WC  . atorvastatin  10 mg Oral q1800  . busPIRone  5 mg Oral BID  . docusate sodium  200 mg Oral QPC breakfast  . feeding supplement  1 Container Oral BID BM  . feeding supplement (PRO-STAT SUGAR FREE 64)  30 mL Oral BID  . levothyroxine  50 mcg  Oral QAC breakfast  . metoprolol succinate  25 mg Oral Daily  . mirtazapine  15 mg Oral QHS  . multivitamin with minerals  1 tablet Oral Daily  . risperiDONE  0.5 mg Oral QHS  . sertraline  25 mg Oral Daily   Continuous Infusions: . cefTRIAXone (ROCEPHIN)  IV Stopped (01/04/17 1818)     LOS: 6 days    Time spent: 25 minutes  Greater than 50% of the time spent on counseling and coordinating the care.   Manson Passey, MD [facility] Pager [phone]  If 7PM-7AM, please contact night-coverage [email or web address] Password [facility] 01/05/2017, 11:51 AM

## 2017-01-05 NOTE — Care Management Important Message (Signed)
Important Message  Patient Details  Name: Benjamin Banks MRN: [id number] Date of Birth: 1925-08-28   Medicare Important Message Given:  Yes    Caren MacadamFuller, Meral Geissinger 01/05/2017, 11:10 AMImportant Message  Patient Details  Name: Benjamin Banks MRN: [id number] Date of Birth: 1925-08-28   Medicare Important Message Given:  Yes    Caren MacadamFuller, Susa Bones 01/05/2017, 11:10 AM

## 2017-01-06 ENCOUNTER — Encounter (HOSPITAL_COMMUNITY): Payer: Self-pay | Admitting: Student

## 2017-01-06 DIAGNOSIS — I10 Essential (primary) hypertension: Secondary | ICD-10-CM | POA: Diagnosis not present

## 2017-01-06 DIAGNOSIS — Z7982 Long term (current) use of aspirin: Secondary | ICD-10-CM | POA: Diagnosis not present

## 2017-01-06 DIAGNOSIS — K5901 Slow transit constipation: Secondary | ICD-10-CM | POA: Diagnosis not present

## 2017-01-06 DIAGNOSIS — F330 Major depressive disorder, recurrent, mild: Secondary | ICD-10-CM | POA: Diagnosis not present

## 2017-01-06 DIAGNOSIS — E039 Hypothyroidism, unspecified: Secondary | ICD-10-CM | POA: Diagnosis not present

## 2017-01-06 DIAGNOSIS — R2681 Unsteadiness on feet: Secondary | ICD-10-CM | POA: Diagnosis not present

## 2017-01-06 DIAGNOSIS — E781 Pure hyperglyceridemia: Secondary | ICD-10-CM | POA: Diagnosis not present

## 2017-01-06 DIAGNOSIS — R638 Other symptoms and signs concerning food and fluid intake: Secondary | ICD-10-CM | POA: Diagnosis not present

## 2017-01-06 DIAGNOSIS — Z9181 History of falling: Secondary | ICD-10-CM | POA: Diagnosis not present

## 2017-01-06 LAB — BASIC METABOLIC PANEL
Anion gap: 7 (ref 5–15)
BUN: 23 mg/dL — AB (ref 6–20)
CALCIUM: 8.8 mg/dL — AB (ref 8.9–10.3)
CO2: 27 mmol/L (ref 22–32)
Chloride: 110 mmol/L (ref 101–111)
Creatinine, Ser: 0.79 mg/dL (ref 0.61–1.24)
GFR calc Af Amer: >60 mL/min (ref >60)
GLUCOSE: 144 mg/dL — AB (ref 65–99)
Potassium: 3.4 mmol/L — ABNORMAL LOW (ref 3.5–5.1)
Sodium: 144 mmol/L (ref 135–145)

## 2017-01-06 MED ORDER — POTASSIUM CHLORIDE CRYS ER 20 MEQ PO TBCR
40.0000 meq | EXTENDED_RELEASE_TABLET | Freq: Once | ORAL | Status: AC
  Administered 2017-01-06: 40 meq via ORAL
  Filled 2017-01-06: qty 2

## 2017-01-06 MED ORDER — HALOPERIDOL LACTATE 5 MG/ML IJ SOLN
1.0000 mg | Freq: Four times a day (QID) | INTRAMUSCULAR | Status: DC | PRN
  Administered 2017-01-06: 1 mg via INTRAMUSCULAR
  Filled 2017-01-06: qty 1

## 2017-01-06 MED ORDER — ENSURE ENLIVE PO LIQD
237.0000 mL | Freq: Two times a day (BID) | ORAL | Status: DC
  Administered 2017-01-06 – 2017-01-09 (×4): 237 mL via ORAL

## 2017-01-06 MED ORDER — HALOPERIDOL 1 MG PO TABS
1.0000 mg | ORAL_TABLET | Freq: Four times a day (QID) | ORAL | Status: DC | PRN
  Filled 2017-01-06: qty 1

## 2017-01-06 NOTE — Progress Notes (Signed)
Physical Therapy Treatment Patient Details Name: Benjamin Banks MRN: [id number] DOB: 1925-09-17 Today's Date: 01/06/2017    History of Present Illness 83 yo male admitted after sustaining a fall at home. S/P L hip hemiarthroplasty-anterior approach-no precautions. Hx of dementia, neuropathy, BPPV, polymyalgia rheumatica, anemia, frequent falls.     PT Comments    Pt awake and stirring around in bed. He was not agitated during session. He participated fairly well however he is much weaker compared to initial evaluation. Pt was able to get to a recliner with significant assistance from PT and OT. Will continue to follow and progress activity as able. Continue to recommend SNF.     Follow Up Recommendations  SNF     Equipment Recommendations  None recommended by PT    Recommendations for Other Services       Precautions / Restrictions Precautions Precautions: Fall Restrictions Weight Bearing Restrictions: No LLE Weight Bearing: Weight bearing as tolerated    Mobility  Bed Mobility Overal bed mobility: Needs Assistance Bed Mobility: Supine to Sit     Supine to sit: Mod assist;HOB elevated     General bed mobility comments: Multimodal cueing required. Increased time. Assist for LEs and trunk.   Transfers Overall transfer level: Needs assistance Equipment used: Rolling walker (2 wheeled) Transfers: Sit to/from [employer]Stand;Stand Pivot Transfers Sit to Stand: +2 physical assistance;+2 safety/equipment;Mod assist Stand pivot transfers: +2 safety/equipment;+2 physical assistance;Mod assist       General transfer comment: Assist to rise, stabilize, control descent, maneuver with RW. Pt tended to stay in a flexed posture (trunk and both knees). When cued to stand up tall, he could make correction but he wouldn't maintain upright posture beyond a few seconds. Stand pivot, bed to recliner, with RW. Pt attempted to sit before safely positioned in front of chair.    Ambulation/Gait Ambulation/Gait assistance: Mod assist;+2 physical assistance;+2 safety/equipment Ambulation Distance (Feet): 2 Feet Assistive device: Rolling walker (2 wheeled) Gait Pattern/deviations: Trunk flexed;Step-to pattern     General Gait Details: Assist to stabilize and support pt. Unable to go beyond a few steps due to poor posture and movement of feet.    Stairs            Wheelchair Mobility    Modified Rankin (Stroke Patients Only)       Balance Overall balance assessment: Needs assistance         Standing balance support: Bilateral upper extremity supported Standing balance-Leahy Scale: Poor                              Cognition Arousal/Alertness: Awake/alert Behavior During Therapy: WFL for tasks assessed/performed Overall Cognitive Status: History of cognitive impairments - at baseline                                 General Comments: pt was awake and moving in bed; followed one step commands. no agitation during session      Exercises      General Comments General comments (skin integrity, edema, etc.): pt calm and soft spoken through session.  Cues to initiate drinking ensure; pt chose flavor.  nursing providing supervision while pt is up      Pertinent Vitals/Pain Pain Assessment: Faces Faces Pain Scale: Hurts a little bit Pain Location: both hips and knees Pain Descriptors / Indicators: Discomfort Pain Intervention(s): Limited activity within patient's tolerance;Repositioned;Monitored during  session    Home Living                      Prior Function            PT Goals (current goals can now be found in the care plan section) Progress towards PT goals: Progressing toward goals    Frequency    Min 3X/week      PT Plan Current plan remains appropriate    Co-evaluation   Reason for Co-Treatment: For patient/therapist safety;Necessary to address cognition/behavior during functional  activity PT goals addressed during session: Mobility/safety with mobility OT goals addressed during session: ADL's and self-care      AM-PAC PT "6 Clicks" Daily Activity  Outcome Measure  Difficulty turning over in bed (including adjusting bedclothes, sheets and blankets)?: Unable Difficulty moving from lying on back to sitting on the side of the bed? : Unable Difficulty sitting down on and standing up from a chair with arms (e.g., wheelchair, bedside commode, etc,.)?: Unable Help needed moving to and from a bed to chair (including a wheelchair)?: Total Help needed walking in hospital room?: Total Help needed climbing 3-5 steps with a railing? : Total 6 Click Score: 6    End of Session Equipment Utilized During Treatment: Gait belt Activity Tolerance: Patient tolerated treatment well Patient left: in chair;with chair alarm set;with nursing/sitter in room   PT Visit Diagnosis: Muscle weakness (generalized) (M62.81);Difficulty in walking, not elsewhere classified (R26.2);Pain     Time: 1450-1515 PT Time Calculation (min) (ACUTE ONLY): 25 min  Charges:  $Therapeutic Activity: 8-22 mins                    G Codes:          Benjamin Banks, MPT Pager: [phone]

## 2017-01-06 NOTE — Progress Notes (Addendum)
Occupational Therapy Treatment and goal update Patient Details Name: Benjamin Banks MRN: [id number] DOB: 08/27/1925 Today's Date: 01/06/2017    History of present illness 83 yo male admitted after sustaining a fall at home. S/P L hip hemiarthroplasty-anterior approach-no precautions. Hx of dementia, neuropathy, BPPV, polymyalgia rheumatica, anemia, frequent falls.    OT comments  Pt cooperative; decline in functional status noted. Adjusted (downgraded) goals to day  Follow Up Recommendations  SNF    Equipment Recommendations  3 in 1 bedside commode    Recommendations for Other Services      Precautions / Restrictions Precautions Precautions: Fall Restrictions Weight Bearing Restrictions: No LLE Weight Bearing: Weight bearing as tolerated       Mobility Bed Mobility Overal bed mobility: Needs Assistance Bed Mobility: Supine to Sit     Supine to sit: Mod assist;HOB elevated     General bed mobility comments: Multimodal cueing required. Increased time. Assist for LEs and trunk.   Transfers Overall transfer level: Needs assistance Equipment used: Rolling walker (2 wheeled) Transfers: Sit to/from [employer];Stand Pivot Transfers Sit to Stand: +2 physical assistance;+2 safety/equipment;Mod assist Stand pivot transfers: +2 safety/equipment;+2 physical assistance;Mod assist       General transfer comment: Assist to rise, stabilize, control descent, maneuver with RW. Pt tended to stay in a flexed posture (trunk and both knees). When cued to stand up tall, he could make correction but he wouldn't maintain upright posture beyond a few seconds. Stand pivot, bed to recliner, with RW. Pt attempted to sit before safely positioned in front of chair.     Balance Overall balance assessment: Needs assistance         Standing balance support: Bilateral upper extremity supported Standing balance-Leahy Scale: Poor                             ADL either performed or  assessed with clinical judgement   ADL   Eating/Feeding: Minimal assistance;Sitting(ensure)   Grooming: Minimal assistance;Sitting(to find washcloth)           Upper Body Dressing : Maximal assistance;Sitting   Lower Body Dressing: Total assistance;Sit to/from stand   Toilet Transfer: Minimal assistance;+2 for safety/equipment;Stand-pivot(chair)   Toileting- Clothing Manipulation and Hygiene: Total assistance;Sit to/from stand         General ADL Comments: donned gown and clean socks.  condom catheter had come off in bed and pt urinated as soon as he stood up.  Assisted him with cleaning up. Pt did not participate with this; however, he attempted to reach for towel that was placed on floor for absorption of urine     Vision       Perception     Praxis      Cognition Arousal/Alertness: Awake/alert Behavior During Therapy: WFL for tasks assessed/performed Overall Cognitive Status: History of cognitive impairments - at baseline                                 General Comments: pt was awake and moving in bed; followed one step commands. no agitation during session        Exercises     Shoulder Instructions       General Comments pt calm and soft spoken through session.  Cues to initiate drinking ensure; pt chose flavor.  nursing providing supervision while pt is up    Pertinent Vitals/ Pain  Pain Assessment: Faces Faces Pain Scale: Hurts a little bit Pain Location: both hips and knees Pain Descriptors / Indicators: Discomfort Pain Intervention(s): Limited activity within patient's tolerance;Repositioned;Monitored during session  Home Living                                          Prior Functioning/Environment              Frequency           Progress Toward Goals  OT Goals(current goals can now be found in the care plan section)  Progress towards OT goals: Not progressing toward goals - comment(decline in  status since admission)  Acute Rehab OT Goals Time For Goal Achievement: 01/14/17 Potential to Achieve Goals: Fair ADL Goals Pt Will Perform Grooming: with supervision;sitting Pt Will Perform Lower Body Bathing: with min assist;sit to/from stand Pt Will Perform Lower Body Dressing: sit to/from stand;with mod assist Pt Will Transfer to Toilet: with min assist;bedside commode;stand pivot transfer Pt Will Perform Toileting - Clothing Manipulation and hygiene: with min assist;sit to/from stand  Plan      Co-evaluation    PT/OT/SLP Co-Evaluation/Treatment: Yes Reason for Co-Treatment: For patient/therapist safety;Necessary to address cognition/behavior during functional activity PT goals addressed during session: Mobility/safety with mobility OT goals addressed during session: ADL's and self-care      AM-PAC PT "6 Clicks" Daily Activity     Outcome Measure   Help from another person eating meals?: A Little Help from another person taking care of personal grooming?: A Little Help from another person toileting, which includes using toliet, bedpan, or urinal?: A Lot Help from another person bathing (including washing, rinsing, drying)?: A Lot Help from another person to put on and taking off regular upper body clothing?: A Lot Help from another person to put on and taking off regular lower body clothing?: Total 6 Click Score: 13    End of Session    OT Visit Diagnosis: Pain Pain - Right/Left: Left Pain - part of body: Hip   Activity Tolerance Patient tolerated treatment well   Patient Left in chair;with call bell/phone within reach;with nursing/sitter in room   Nurse Communication          Time: [id number] OT Time Calculation (min): 25 min  Charges: OT General Charges $OT Visit: 1 Visit OT Treatments $Self Care/Home Management : 8-22 mins  Benjamin Banks, Benjamin Banks [phone] 01/06/2017   Benjamin Banks 01/06/2017, 3:29 PM

## 2017-01-06 NOTE — Progress Notes (Signed)
Date: January 06, 2017 Pavneet Markwood, BSN, RN3, CCM  [phone] Chart and notes review for patient progress and needs. Will follow for case management and discharge needs. Next review date: [id number] 

## 2017-01-06 NOTE — Progress Notes (Signed)
Patient is increasingly agitated and restless; notified MD; waist soft no violent restrain applied as MD ordered. Vital sign was taken; patient was assessed and monitor closely.

## 2017-01-06 NOTE — Progress Notes (Signed)
Nutrition Follow-up  DOCUMENTATION CODES:   Not applicable  INTERVENTION:    Ensure Enlive po BID, each supplement provides 350 kcal and 20 grams of protein  Provide MVI daily  NUTRITION DIAGNOSIS:   Increased nutrient needs related to hip fracture, wound healing as evidenced by estimated needs.  Ongoing  GOAL:   Patient will meet greater than or equal to 90% of their needs  Not meeting  MONITOR:   PO intake, Supplement acceptance, Weight trends, Labs, Diet advancement  REASON FOR ASSESSMENT:   Consult Hip fracture protocol  ASSESSMENT:   Pt with PMH significant for dementia, HTN, chronic iron deficiency anemia, and BPH. Presents this admission with left hip fracture after a mechanical fall.    Pt agitated upon follow up, unable to speak.  Spoke with RN at bedside who reports pt appetite is improving. Meal completions charted as 30% average for last eight meals.  Nursing student states pt drinks more than he eats. Ensure observed at bedside. Pt mostly drink 50-75% of each Ensure given.  No recent weights have been obtained since last RD visit.  Will change boost to Ensure and continue to encourage PO intake.   Medications reviewed and include: colace, MVI with minerals Labs reviewed: K 3.4 (L) BUN 23 (H)   Diet Order:  Diet Heart Room service appropriate? Yes; Fluid consistency: Thin  EDUCATION NEEDS:   Not appropriate for education at this time  Skin:  Skin Assessment: Reviewed RN Assessment  Last BM:  01/02/17  Height:   Ht Readings from Last 1 Encounters:  12/30/16 5\' 10"  (1.778 m)    Weight:   Wt Readings from Last 1 Encounters:  12/30/16 130 lb 8.2 oz (59.2 kg)    Ideal Body Weight:  75.5 kg  BMI:  Body mass index is 18.73 kg/m.  Estimated Nutritional Needs:   Kcal:  2000-2200 kcal/day  Protein:  100-110 g/day  Fluid:  >2 L/day    Vanessa Kickarly Arland Usery RD, LDN Clinical Nutrition Pager # [phone]

## 2017-01-06 NOTE — Progress Notes (Signed)
OT Cancellation Note  Patient Details Name: Benjamin Banks MRN: [id number] DOB: February 19, 1925   Cancelled Treatment:    Reason Eval/Treat Not Completed: Fatigue/lethargy limiting ability to participate  Alexsandra Shontz 01/06/2017, 2:04 PM  Marica OtterMaryellen Joushua Dugar, OTR/L [phone] 01/06/2017

## 2017-01-06 NOTE — Progress Notes (Addendum)
LCSW following for SNF placement.   D[place]. Will discuss with nephew and get back to LCSW with decision.   [facility]. Will go to SNF once medically cleared. Needs bed choice.  Left message for POA, Darel HongJudy to give bed offers. LCSW left list of bed offers in patients room.   LCSW will continue to follow for SNF placement.   Beulah GandyBernette Jandi Swiger, LSCW [facility] [phone].

## 2017-01-06 NOTE — Progress Notes (Signed)
Patient ID: Benjamin Banks, male   DOB: 23-Sep-1925, 83 y.o.   MRN: [id number]  PROGRESS NOTE    Benjamin Banks  [id number] DOB: 23-Sep-1925 DOA: 1Sep 14, 202018  PCP: Merlene LaughterStoneking, Hal, MD   Brief Narrative:  83 year old male with hypothyroidism, dementia who presented to ED status post fall and has sustained left hip fracture. She underwent left hip hemiarthroplasty 12/31/2016 and developed post-operative psychosis and delirium.  Assessment & Plan:   Principal Problem:   Closed left hip fracture, initial encounter [facility](HCC) / Displaced fracture of left femoral neck (HCC) - S/P left hip hemiarthroplasty 12/31/2016 by Dr. Samson FredericBrian Swinteck - Continue aspirin - PT eval as pt tolerates  Active Problems:   Essential hypertension - Continue metoprolol     Hypokalemia - Continue to supplement     Dementia with delirium / Depression - Has had postoperative psychosis - Continue Buspar, remeron - Stable, remains confused     Hypothyroidism - Continue synthroid     Leukopenia / Thrombocytopenia (HCC) - Stable - Follow up CBC In am    Acute lower UTI due to E.Coli - Completed Rocephin     Acute postoperative blood loss anemia - Stable       Transient A fib - Continue aspirin and metoprolol     Moderate protein calorie malnutrition - In the context of chronic illness - Diet as tolerated     DVT prophylaxis: SCD's Code Status: DNR/DNI Family Communication: no family at the bedside  Disposition Plan: to SNF once free from restraints    Consultants:   Ortho, Dr. Ranell PatrickNorris   Procedures:   Left hip hemiarthroplasty 12/30/2016  Antimicrobials:   Rocephin   Subjective: No overnight events.  Objective: Vitals:   01/05/17 1400 01/05/17 2129 01/06/17 0546 01/06/17 1401  BP:  (!) 181/72 130/60   Pulse: (!) 58 (!) 59 82 (!) 54  Resp:  18 17 14   Temp:  98.2 F (36.8 C) 97.6 F (36.4 C)   TempSrc:  Oral Oral   SpO2:  98% 97% 94%  Weight:      Height:         Intake/Output Summary (Last 24 hours) at 01/06/2017 1532 Last data filed at 01/06/2017 0548 Gross per 24 hour  Intake 60 ml  Output 400 ml  Net -340 ml   Filed Weights   12/30/16 0351  Weight: 59.2 kg (130 lb 8.2 oz)    Physical Exam  Constitutional: Appears well-developed and well-nourished. No distress.  HENT: Normocephalic. External right and left ear normal. Oropharynx is clear and moist.  Eyes: Conjunctivae and EOM are normal. PERRLA, no scleral icterus.  Neck: Normal ROM. Neck supple. No JVD. No tracheal deviation. No thyromegaly.  CVS: Rate controlled, S1/S2 + Pulmonary: Effort and breath sounds normal, no stridor, rhonchi, wheezes, rales.  Abdominal: Soft. BS +,  no distension, tenderness, rebound or guarding.  Musculoskeletal:No edema and no tenderness.  Lymphadenopathy: No lymphadenopathy noted, cervical, inguinal. Neuro: Alert. Disoriented Skin: Skin is warm and dry.  Psychiatric: Normal mood and affect.     Data Reviewed: I have personally reviewed following labs and imaging studies  CBC: Recent Labs  Lab 12/29/16 2243 12/30/16 0901 12/31/16 0602 01/01/17 0626 01/02/17 0851 01/03/17 0624 01/05/17 0512  WBC 3.8* 13.8* 9.9 7.2 6.7 6.4 5.4  NEUTROABS 3.5 12.5* 8.3*  --   --   --   --   HGB 18.6* 10.6* 9.5* 9.2* 10.6* 10.3* 9.2*  HCT 53.4* 32.1* 28.9* 28.2* 32.0* 30.6*  27.5*  MCV 94.8 96.7 98.0 96.9 95.5 94.7 96.2  PLT 126* 227 195 193 220 229 262   Basic Metabolic Panel: Recent Labs  Lab 12/31/16 0602 01/01/17 0626 01/03/17 0624 01/05/17 0512 01/06/17 0555  NA 138 140 142 145 144  K 3.8 3.5 3.5 3.4* 3.4*  CL 107 107 106 112* 110  CO2 27 28 26 27 27   GLUCOSE 102* 88 77 90 144*  BUN 32* 26* 21* 23* 23*  CREATININE 0.91 0.91 0.67 0.89 0.79  CALCIUM 8.1* 8.3* 8.3* 8.3* 8.8*  MG 2.0  --   --   --   --    GFR: Estimated Creatinine Clearance: 53.4 mL/min (by C-G formula based on SCr of 0.79 mg/dL). Liver Function Tests: No results for  input(s): AST, ALT, ALKPHOS, BILITOT, PROT, ALBUMIN in the last 168 hours. No results for input(s): LIPASE, AMYLASE in the last 168 hours. No results for input(s): AMMONIA in the last 168 hours. Coagulation Profile: No results for input(s): INR, PROTIME in the last 168 hours. Cardiac Enzymes: No results for input(s): CKTOTAL, CKMB, CKMBINDEX, TROPONINI in the last 168 hours. BNP (last 3 results) No results for input(s): PROBNP in the last 8760 hours. HbA1C: No results for input(s): HGBA1C in the last 72 hours. CBG: No results for input(s): GLUCAP in the last 168 hours. Lipid Profile: No results for input(s): CHOL, HDL, LDLCALC, TRIG, CHOLHDL, LDLDIRECT in the last 72 hours. Thyroid Function Tests: No results for input(s): TSH, T4TOTAL, FREET4, T3FREE, THYROIDAB in the last 72 hours. Anemia Panel: No results for input(s): VITAMINB12, FOLATE, FERRITIN, TIBC, IRON, RETICCTPCT in the last 72 hours. Urine analysis:    Component Value Date/Time   COLORURINE YELLOW 12/30/2016 0830   APPEARANCEUR HAZY (A) 12/30/2016 0830   LABSPEC 1.019 12/30/2016 0830   PHURINE 5.0 12/30/2016 0830   GLUCOSEU NEGATIVE 12/30/2016 0830   HGBUR MODERATE (A) 12/30/2016 0830   BILIRUBINUR NEGATIVE 12/30/2016 0830   KETONESUR NEGATIVE 12/30/2016 0830   PROTEINUR NEGATIVE 12/30/2016 0830   NITRITE POSITIVE (A) 12/30/2016 0830   LEUKOCYTESUR LARGE (A) 12/30/2016 0830   Sepsis Labs: @LABRCNTIP (procalcitonin:4,lacticidven:4)    Surgical PCR screen     Status: None   Collection Time: 12/30/16  4:51 AM  Result Value Ref Range Status   MRSA, PCR NEGATIVE NEGATIVE Final   Staphylococcus aureus NEGATIVE NEGATIVE Final  Culture, Urine     Status: Abnormal   Collection Time: 12/30/16  8:51 AM  Result Value Ref Range Status   Specimen Description URINE, CLEAN CATCH  Final   Special Requests NONE  Final   Report Status 01/01/2017 FINAL  Final   Organism ID, Bacteria ESCHERICHIA COLI (A)  Final       Susceptibility   Escherichia coli - MIC*    AMPICILLIN >=32 RESISTANT Resistant     CEFAZOLIN >=64 RESISTANT Resistant     CEFTRIAXONE <=1 SENSITIVE Sensitive     CIPROFLOXACIN >=4 RESISTANT Resistant     GENTAMICIN <=1 SENSITIVE Sensitive     IMIPENEM <=0.25 SENSITIVE Sensitive     NITROFURANTOIN <=16 SENSITIVE Sensitive     TRIMETH/SULFA <=20 SENSITIVE Sensitive     AMPICILLIN/SULBACTAM >=32 RESISTANT Resistant     PIP/TAZO 64 INTERMEDIATE Intermediate     Extended ESBL NEGATIVE Sensitive     * >=100,000 COLONIES/mL ESCHERICHIA COLI      Radiology Studies: No results found.   Scheduled Meds: . aspirin EC  81 mg Oral BID WC  . atorvastatin  10 mg Oral  q1800  . busPIRone  5 mg Oral BID  . docusate sodium  200 mg Oral QPC breakfast  . feeding supplement (ENSURE ENLIVE)  237 mL Oral BID BM  . levothyroxine  50 mcg Oral QAC breakfast  . metoprolol succinate  25 mg Oral Daily  . mirtazapine  15 mg Oral QHS  . multivitamin with minerals  1 tablet Oral Daily  . potassium chloride  40 mEq Oral Once  . risperiDONE  0.5 mg Oral QHS  . sertraline  25 mg Oral Daily   Continuous Infusions:    LOS: 7 days    Time spent: 25 minutes  Greater than 50% of the time spent on counseling and coordinating the care.   Manson Passey, MD [facility] Pager [phone]  If 7PM-7AM, please contact night-coverage [email or web address] Password [facility] 01/06/2017, 3:32 PM

## 2017-01-07 NOTE — Progress Notes (Signed)
Patient ID: Benjamin Banks, male   DOB: [birth year], 83 y.o.   MRN: [id number]  PROGRESS NOTE    Benjamin Banks  LKG:[id number] DOB: [birth year] DOA: 1Jan 16, 202018  PCP: Merlene LaughterStoneking, Hal, MD   Brief Narrative:  83 year old male with hypothyroidism, dementia who presented to ED status post fall and has sustained left hip fracture. She underwent left hip hemiarthroplasty 12/31/2016 and developed post-operative psychosis and delirium.  Assessment & Plan:   Principal Problem:   Closed left hip fracture, initial encounter [facility](HCC) / Displaced fracture of left femoral neck (HCC) - S/P left hip hemiarthroplasty 12/31/2016 by Dr. Samson FredericBrian Swinteck - Continue aspirin daily - Patient will go to skilled nursing facility once he is free from restraints 24 hours  Active Problems:   Essential hypertension - Continue metoprolol    Hypokalemia - Supplemented - Follow-up BMP tomorrow morning    Dementia with delirium / Depression - Developed postoperative psychosis superimposed on his dementia - Continue BuSpar and Remeron - Agitated, restless overnight    Hypothyroidism - Continue Synthroid    Leukopenia / Thrombocytopenia (HCC) - White blood cell count and platelets are now within normal limits    Acute lower UTI due to E.Coli - Completed Rocephin    Acute postoperative blood loss anemia - Hemoglobin stable     Transient A fib - Continue aspirin and metoprolol    Moderate protein calorie malnutrition - Diet as tolerated    DVT prophylaxis: SCDs Code Status: DNR/DNI Family Communication:  no family at the bedside  Disposition Plan: To skilled nursing facility once patient is free from restraints for 24 hours   Consultants:   Ortho, Dr. Ranell PatrickNorris   Procedures:   Left hip hemiarthroplasty 12/30/2016  Antimicrobials:   Rocephin   Subjective: Agitated and restless overnight.  Objective: Vitals:   01/06/17 0546 01/06/17 1300 01/06/17 1401 01/06/17 2225  BP: 130/60  129/78  (!) 160/51  Pulse: 82 88 (!) 54 (!) 44  Resp: 17 18 14 14   Temp: 97.6 F (36.4 C) 97.8 F (36.6 C)  (!) 97.5 F (36.4 C)  TempSrc: Oral Axillary  Axillary  SpO2: 97% 98% 94%   Weight:      Height:        Intake/Output Summary (Last 24 hours) at 01/07/2017 0659 Last data filed at 01/06/2017 1700 Gross per 24 hour  Intake 360 ml  Output 125 ml  Net 235 ml   Filed Weights   12/30/16 0351  Weight: 59.2 kg (130 lb 8.2 oz)    Physical Exam  Constitutional: Appears calm but overnight agitated CVS: Rate controlled, S1/S2 + Pulmonary: Effort and breath sounds normal, no stridor, rhonchi, wheezes, rales.  Abdominal: Soft. BS +,  no distension, tenderness, rebound or guarding.  Musculoskeletal: No edema and no tenderness.  Lymphadenopathy: No lymphadenopathy noted, cervical, inguinal. Neuro: Alert. No cranial nerve deficit. Skin: Skin is warm and dry.  Psychiatric: Normal mood and affect.       Data Reviewed: I have personally reviewed following labs and imaging studies  CBC: Recent Labs  Lab 12/29/16 2243 12/30/16 0901 12/31/16 0602 01/01/17 0626 01/02/17 0851 01/03/17 0624 01/05/17 0512  WBC 3.8* 13.8* 9.9 7.2 6.7 6.4 5.4  NEUTROABS 3.5 12.5* 8.3*  --   --   --   --   HGB 18.6* 10.6* 9.5* 9.2* 10.6* 10.3* 9.2*  HCT 53.4* 32.1* 28.9* 28.2* 32.0* 30.6* 27.5*  MCV 94.8 96.7 98.0 96.9 95.5 94.7 96.2  PLT 126* 227 195 193  220 229 262   Basic Metabolic Panel: Recent Labs  Lab 01/01/17 0626 01/03/17 0624 01/05/17 0512 01/06/17 0555  NA 140 142 145 144  K 3.5 3.5 3.4* 3.4*  CL 107 106 112* 110  CO2 28 26 27 27   GLUCOSE 88 77 90 144*  BUN 26* 21* 23* 23*  CREATININE 0.91 0.67 0.89 0.79  CALCIUM 8.3* 8.3* 8.3* 8.8*   GFR: Estimated Creatinine Clearance: 53.4 mL/min (by C-G formula based on SCr of 0.79 mg/dL). Liver Function Tests: No results for input(s): AST, ALT, ALKPHOS, BILITOT, PROT, ALBUMIN in the last 168 hours. No results for input(s):  LIPASE, AMYLASE in the last 168 hours. No results for input(s): AMMONIA in the last 168 hours. Coagulation Profile: No results for input(s): INR, PROTIME in the last 168 hours. Cardiac Enzymes: No results for input(s): CKTOTAL, CKMB, CKMBINDEX, TROPONINI in the last 168 hours. BNP (last 3 results) No results for input(s): PROBNP in the last 8760 hours. HbA1C: No results for input(s): HGBA1C in the last 72 hours. CBG: No results for input(s): GLUCAP in the last 168 hours. Lipid Profile: No results for input(s): CHOL, HDL, LDLCALC, TRIG, CHOLHDL, LDLDIRECT in the last 72 hours. Thyroid Function Tests: No results for input(s): TSH, T4TOTAL, FREET4, T3FREE, THYROIDAB in the last 72 hours. Anemia Panel: No results for input(s): VITAMINB12, FOLATE, FERRITIN, TIBC, IRON, RETICCTPCT in the last 72 hours. Urine analysis:    Component Value Date/Time   COLORURINE YELLOW 12/30/2016 0830   APPEARANCEUR HAZY (A) 12/30/2016 0830   LABSPEC 1.019 12/30/2016 0830   PHURINE 5.0 12/30/2016 0830   GLUCOSEU NEGATIVE 12/30/2016 0830   HGBUR MODERATE (A) 12/30/2016 0830   BILIRUBINUR NEGATIVE 12/30/2016 0830   KETONESUR NEGATIVE 12/30/2016 0830   PROTEINUR NEGATIVE 12/30/2016 0830   NITRITE POSITIVE (A) 12/30/2016 0830   LEUKOCYTESUR LARGE (A) 12/30/2016 0830   Sepsis Labs: @LABRCNTIP (procalcitonin:4,lacticidven:4)    Surgical PCR screen     Status: None   Collection Time: 12/30/16  4:51 AM  Result Value Ref Range Status   MRSA, PCR NEGATIVE NEGATIVE Final   Staphylococcus aureus NEGATIVE NEGATIVE Final  Culture, Urine     Status: Abnormal   Collection Time: 12/30/16  8:51 AM  Result Value Ref Range Status   Specimen Description URINE, CLEAN CATCH  Final   Special Requests NONE  Final   Report Status 01/01/2017 FINAL  Final   Organism ID, Bacteria ESCHERICHIA COLI (A)  Final      Susceptibility   Escherichia coli - MIC*    AMPICILLIN >=32 RESISTANT Resistant     CEFAZOLIN >=64 RESISTANT  Resistant     CEFTRIAXONE <=1 SENSITIVE Sensitive     CIPROFLOXACIN >=4 RESISTANT Resistant     GENTAMICIN <=1 SENSITIVE Sensitive     IMIPENEM <=0.25 SENSITIVE Sensitive     NITROFURANTOIN <=16 SENSITIVE Sensitive     TRIMETH/SULFA <=20 SENSITIVE Sensitive     AMPICILLIN/SULBACTAM >=32 RESISTANT Resistant     PIP/TAZO 64 INTERMEDIATE Intermediate     Extended ESBL NEGATIVE Sensitive     * >=100,000 COLONIES/mL ESCHERICHIA COLI      Radiology Studies: No results found.   Scheduled Meds: . aspirin EC  81 mg Oral BID WC  . atorvastatin  10 mg Oral q1800  . busPIRone  5 mg Oral BID  . docusate sodium  200 mg Oral QPC breakfast  . feeding supplement (ENSURE ENLIVE)  237 mL Oral BID BM  . levothyroxine  50 mcg Oral QAC breakfast  .  metoprolol succinate  25 mg Oral Daily  . mirtazapine  15 mg Oral QHS  . multivitamin with minerals  1 tablet Oral Daily  . risperiDONE  0.5 mg Oral QHS  . sertraline  25 mg Oral Daily   Continuous Infusions:    LOS: 8 days    Time spent: 25 minutes  Greater than 50% of the time spent on counseling and coordinating the care.   Manson PasseyAlma Javaria Knapke, MD [facility] Pager [phone]  If 7PM-7AM, please contact night-coverage [email or web address] Password [email or web address] 01/07/2017, 6:59 AM

## 2017-01-08 LAB — BASIC METABOLIC PANEL
ANION GAP: 6 (ref 5–15)
BUN: 18 mg/dL (ref 6–20)
CHLORIDE: 107 mmol/L (ref 101–111)
CO2: 27 mmol/L (ref 22–32)
CREATININE: 0.84 mg/dL (ref 0.61–1.24)
Calcium: 8.7 mg/dL — ABNORMAL LOW (ref 8.9–10.3)
GFR calc non Af Amer: >60 mL/min (ref >60)
Glucose, Bld: 96 mg/dL (ref 65–99)
POTASSIUM: 3.7 mmol/L (ref 3.5–5.1)
SODIUM: 140 mmol/L (ref 135–145)

## 2017-01-08 NOTE — Progress Notes (Signed)
Physical Therapy Treatment Patient Details Name: Benjamin Banks MRN: [id number] DOB: November 09, 1925 Today's Date: 01/08/2017    History of Present Illness 83 yo male admitted after sustaining a fall at home. S/P L hip hemiarthroplasty-anterior approach-no precautions. Hx of dementia, neuropathy, BPPV, polymyalgia rheumatica, anemia, frequent falls.     PT Comments    Progressing slowly with mobility. Pt participated fairly well. He was cooperative. Continue to recommend SNF.    Follow Up Recommendations  SNF     Equipment Recommendations  None recommended by PT    Recommendations for Other Services       Precautions / Restrictions Precautions Precautions: Fall Restrictions Weight Bearing Restrictions: No LLE Weight Bearing: Weight bearing as tolerated    Mobility  Bed Mobility Overal bed mobility: Needs Assistance       Supine to sit: Min guard Sit to supine: Min guard   General bed mobility comments: for safety  Transfers Overall transfer level: Needs assistance Equipment used: Rolling walker (2 wheeled) Transfers: Sit to/from Stand Sit to Stand: Min assist;+2 physical assistance;+2 safety/equipment Stand pivot transfers: Min assist;+2 physical assistance;+2 safety/equipment       General transfer comment: Assist to rise, steady, control descent. Pt attempts to sit before safely positioned, especially after performing a stand pivot.   Ambulation/Gait Ambulation/Gait assistance: Min assist;+2 safety/equipment Ambulation Distance (Feet): 25 Feet(x2) Assistive device: Rolling walker (2 wheeled) Gait Pattern/deviations: Step-to pattern;Step-through pattern;Decreased stride length;Trunk flexed     General Gait Details: Multimodal cueing for safety, distance from RW, step length. Pt does not make corrections when cued. Assist to stabilize and maneuver safely with a RW. Followed closely with a recliner. Seated rest break taken/needed due to fatigue.    Stairs            Wheelchair Mobility    Modified Rankin (Stroke Patients Only)       Balance Overall balance assessment: Needs assistance;History of Falls         Standing balance support: Bilateral upper extremity supported Standing balance-Leahy Scale: Poor                              Cognition Arousal/Alertness: Awake/alert Behavior During Therapy: WFL for tasks assessed/performed Overall Cognitive Status: History of cognitive impairments - at baseline                                 General Comments: awake and cooperative      Exercises      General Comments        Pertinent Vitals/Pain Pain Assessment: Faces Faces Pain Scale: No hurt Pain Intervention(s): Limited activity within patient's tolerance;Monitored during session;Repositioned    Home Living                      Prior Function            PT Goals (current goals can now be found in the care plan section) Progress towards PT goals: Progressing toward goals    Frequency           PT Plan Current plan remains appropriate    Co-evaluation   Reason for Co-Treatment: For patient/therapist safety PT goals addressed during session: Mobility/safety with mobility OT goals addressed during session: ADL's and self-care      AM-PAC PT "6 Clicks" Daily Activity  Outcome Measure  Difficulty turning over in  bed (including adjusting bedclothes, sheets and blankets)?: A Little Difficulty moving from lying on back to sitting on the side of the bed? : A Little Difficulty sitting down on and standing up from a chair with arms (e.g., wheelchair, bedside commode, etc,.)?: Unable Help needed moving to and from a bed to chair (including a wheelchair)?: A Lot Help needed walking in hospital room?: A Lot Help needed climbing 3-5 steps with a railing? : Total 6 Click Score: 12    End of Session Equipment Utilized During Treatment: Gait belt Activity Tolerance: Patient  tolerated treatment well Patient left: in bed;with call bell/phone within reach;with bed alarm set   PT Visit Diagnosis: Difficulty in walking, not elsewhere classified (R26.2);Muscle weakness (generalized) (M62.81)     Time: [id number] PT Time Calculation (min) (ACUTE ONLY): 17 min  Charges:  $Gait Training: 8-22 mins                    G Codes:          Rebeca AlertJannie Oshen Wlodarczyk, MPT Pager: [phone]

## 2017-01-08 NOTE — Progress Notes (Addendum)
Patient ID: Benjamin Banks, male   DOB: Jul 01, 1925, 83 y.o.   MRN: [id number]  PROGRESS NOTE    Benjamin PartLardric Jr Graver  WUJ:[id number] DOB: Jul 01, 1925 DOA: [id number]  PCP: Merlene LaughterStoneking, Hal, MD   Brief Narrative:  83 year old male with hypothyroidism, dementia who presented to ED status post fall and has sustained left hip fracture. She underwent left hip hemiarthroplasty 12/31/2016 and developed post-operative psychosis and delirium.  Assessment & Plan:   Principal Problem:   Closed left hip fracture, initial encounter [facility](HCC) / Displaced fracture of left femoral neck (HCC) - S/P left hip hemiarthroplasty 12/31/2016 by Dr. Samson FredericBrian Swinteck - Daily aspirin - To SNF on discharge once no restraints for 24 hours   Active Problems:   Essential hypertension - Continue metoprolol     Hypokalemia - Supplemented and WNL    Dementia with delirium / Depression - Developed postoperative psychosis superimposed on his dementia - Continue Buspar and Remeron     Hypothyroidism - Continue synthroid     Leukopenia / Thrombocytopenia (HCC) - Counts WNL    Acute lower UTI due to E.Coli - Completed rocephin     Acute postoperative blood loss anemia - Hgb stable      Transient A fib - Continue aspirin and metoprolol     Moderate protein calorie malnutrition - As tolerated     DVT prophylaxis: SCD's Code Status: DNR/DNI Family Communication:  No family at the bedside  Disposition Plan: to SNF once no restraints for 24 hours    Consultants:   Ortho, Dr. Ranell PatrickNorris   Procedures:   Left hip hemiarthroplasty 12/30/2016  Antimicrobials:   Rocephin   Subjective: No overnight events.  Objective: Vitals:   01/07/17 0733 01/07/17 1345 01/07/17 1938 01/08/17 0535  BP:  136/64 (!) 157/66 (!) 156/75  Pulse:  (!) 51 (!) 57 66  Resp:  16 20 20   Temp:  97.7 F (36.5 C) 98.4 F (36.9 C) 97.9 F (36.6 C)  TempSrc:  Axillary Oral Oral  SpO2: 98% 100% 97% 100%  Weight:      Height:         Intake/Output Summary (Last 24 hours) at 01/08/2017 [id number] Last data filed at 01/07/2017 1700 Gross per 24 hour  Intake 0 ml  Output 275 ml  Net -275 ml   Filed Weights   12/30/16 0351  Weight: 59.2 kg (130 lb 8.2 oz)    Physical Exam  Constitutional: Appears in no distress.  CVS: Rate controlled, S1/S2 + Pulmonary: Effort and breath sounds normal, no stridor, rhonchi, wheezes, rales.  Abdominal: Soft. BS +,  no distension, tenderness, rebound or guarding.  Musculoskeletal: Normal range of motion. No edema and no tenderness.  Lymphadenopathy: No lymphadenopathy noted, cervical, inguinal. Neuro: Alert. Normal reflexes, muscle tone coordination. No cranial nerve deficit. Skin: Skin is warm and dry.  Psychiatric: Not restless or agitated     Data Reviewed: I have personally reviewed following labs and imaging studies  CBC: Recent Labs  Lab 12/29/16 2243 12/30/16 0901 12/31/16 0602 01/01/17 0626 01/02/17 0851 01/03/17 0624 01/05/17 0512  WBC 3.8* 13.8* 9.9 7.2 6.7 6.4 5.4  NEUTROABS 3.5 12.5* 8.3*  --   --   --   --   HGB 18.6* 10.6* 9.5* 9.2* 10.6* 10.3* 9.2*  HCT 53.4* 32.1* 28.9* 28.2* 32.0* 30.6* 27.5*  MCV 94.8 96.7 98.0 96.9 95.5 94.7 96.2  PLT 126* 227 195 193 220 229 262   Basic Metabolic Panel: Recent Labs  Lab 01/03/17  1610 01/05/17 0512 01/06/17 0555 01/08/17 0603  NA 142 145 144 140  K 3.5 3.4* 3.4* 3.7  CL 106 112* 110 107  CO2 26 27 27 27   GLUCOSE 77 90 144* 96  BUN 21* 23* 23* 18  CREATININE 0.67 0.89 0.79 0.84  CALCIUM 8.3* 8.3* 8.8* 8.7*   GFR: Estimated Creatinine Clearance: 50.9 mL/min (by C-G formula based on SCr of 0.84 mg/dL). Liver Function Tests: No results for input(s): AST, ALT, ALKPHOS, BILITOT, PROT, ALBUMIN in the last 168 hours. No results for input(s): LIPASE, AMYLASE in the last 168 hours. No results for input(s): AMMONIA in the last 168 hours. Coagulation Profile: No results for input(s): INR, PROTIME in the last  168 hours. Cardiac Enzymes: No results for input(s): CKTOTAL, CKMB, CKMBINDEX, TROPONINI in the last 168 hours. BNP (last 3 results) No results for input(s): PROBNP in the last 8760 hours. HbA1C: No results for input(s): HGBA1C in the last 72 hours. CBG: No results for input(s): GLUCAP in the last 168 hours. Lipid Profile: No results for input(s): CHOL, HDL, LDLCALC, TRIG, CHOLHDL, LDLDIRECT in the last 72 hours. Thyroid Function Tests: No results for input(s): TSH, T4TOTAL, FREET4, T3FREE, THYROIDAB in the last 72 hours. Anemia Panel: No results for input(s): VITAMINB12, FOLATE, FERRITIN, TIBC, IRON, RETICCTPCT in the last 72 hours. Urine analysis:    Component Value Date/Time   COLORURINE YELLOW 12/30/2016 0830   APPEARANCEUR HAZY (A) 12/30/2016 0830   LABSPEC 1.019 12/30/2016 0830   PHURINE 5.0 12/30/2016 0830   GLUCOSEU NEGATIVE 12/30/2016 0830   HGBUR MODERATE (A) 12/30/2016 0830   BILIRUBINUR NEGATIVE 12/30/2016 0830   KETONESUR NEGATIVE 12/30/2016 0830   PROTEINUR NEGATIVE 12/30/2016 0830   NITRITE POSITIVE (A) 12/30/2016 0830   LEUKOCYTESUR LARGE (A) 12/30/2016 0830   Sepsis Labs: @LABRCNTIP (procalcitonin:4,lacticidven:4)    Surgical PCR screen     Status: None   Collection Time: 12/30/16  4:51 AM  Result Value Ref Range Status   MRSA, PCR NEGATIVE NEGATIVE Final   Staphylococcus aureus NEGATIVE NEGATIVE Final  Culture, Urine     Status: Abnormal   Collection Time: 12/30/16  8:51 AM  Result Value Ref Range Status   Specimen Description URINE, CLEAN CATCH  Final   Special Requests NONE  Final   Report Status 01/01/2017 FINAL  Final   Organism ID, Bacteria ESCHERICHIA COLI (A)  Final      Susceptibility   Escherichia coli - MIC*    AMPICILLIN >=32 RESISTANT Resistant     CEFAZOLIN >=64 RESISTANT Resistant     CEFTRIAXONE <=1 SENSITIVE Sensitive     CIPROFLOXACIN >=4 RESISTANT Resistant     GENTAMICIN <=1 SENSITIVE Sensitive     IMIPENEM <=0.25 SENSITIVE  Sensitive     NITROFURANTOIN <=16 SENSITIVE Sensitive     TRIMETH/SULFA <=20 SENSITIVE Sensitive     AMPICILLIN/SULBACTAM >=32 RESISTANT Resistant     PIP/TAZO 64 INTERMEDIATE Intermediate     Extended ESBL NEGATIVE Sensitive     * >=100,000 COLONIES/mL ESCHERICHIA COLI      Radiology Studies: No results found.   Scheduled Meds: . aspirin EC  81 mg Oral BID WC  . atorvastatin  10 mg Oral q1800  . busPIRone  5 mg Oral BID  . docusate sodium  200 mg Oral QPC breakfast  . feeding supplement (ENSURE ENLIVE)  237 mL Oral BID BM  . levothyroxine  50 mcg Oral QAC breakfast  . metoprolol succinate  25 mg Oral Daily  . mirtazapine  15  mg Oral QHS  . multivitamin with minerals  1 tablet Oral Daily  . risperiDONE  0.5 mg Oral QHS  . sertraline  25 mg Oral Daily   Continuous Infusions:    LOS: 9 days    Time spent: 25 minutes  Greater than 50% of the time spent on counseling and coordinating the care.   Manson PasseyAlma Kashaun Bebo, MD [facility] Pager [phone]  If 7PM-7AM, please contact night-coverage [email or web address] Password [email or web address] 01/08/2017, 6:42 AM

## 2017-01-08 NOTE — Progress Notes (Signed)
Occupational Therapy Treatment Patient Details Name: Benjamin Banks Cassetta MRN: [id number] DOB: Oct 05, 1925 Today's Date: 01/08/2017    History of present illness 83 yo male admitted after sustaining a fall at home. S/P L hip hemiarthroplasty-anterior approach-no precautions. Hx of dementia, neuropathy, BPPV, polymyalgia rheumatica, anemia, frequent falls.    OT comments  Pt cooperative. Ambulated in hall and assisted with pulling up socks.  No sign of pain today  Follow Up Recommendations  SNF    Equipment Recommendations  3 in 1 bedside commode    Recommendations for Other Services      Precautions / Restrictions Precautions Precautions: Fall Restrictions LLE Weight Bearing: Weight bearing as tolerated       Mobility Bed Mobility         Supine to sit: Min guard Sit to supine: Min guard   General bed mobility comments: for safety  Transfers   Equipment used: Rolling walker (2 wheeled)   Sit to Stand: Min assist;+2 safety/equipment         General transfer comment:  Assist to rise and steadying assistance.      Balance                                           ADL either performed or assessed with clinical judgement   ADL                       Lower Body Dressing: Maximal assistance;Sit to/from stand   Toilet Transfer: Minimal assistance;+2 for safety/equipment;Stand-pivot   Toileting- Clothing Manipulation and Hygiene: Total assistance;Sit to/from stand         General ADL Comments: pt reached to adjust socks.  Stated need to use bathroom, but did not have time to place urinal initially. Placed it again after pt had accident; he did not assist with holding urinal nor cleaning up.  Pt much more interactive today. Ambulated in hall and returned to bed after this.  Min +2 for safety.  Pt did not keep walker close enough to body.  Began to pick it up when stablized by therapist     Vision       Perception     Praxis       Cognition Arousal/Alertness: Awake/alert Behavior During Therapy: WFL for tasks assessed/performed Overall Cognitive Status: History of cognitive impairments - at baseline. Reoriented to place and situation                                 General Comments: awake and cooperative        Exercises     Shoulder Instructions       General Comments      Pertinent Vitals/ Pain       Pain Assessment: Faces Faces Pain Scale: No hurt Pain Intervention(s): Limited activity within patient's tolerance;Monitored during session;Repositioned  Home Living                                          Prior Functioning/Environment              Frequency           Progress Toward Goals  OT Goals(current goals can now be found  in the care plan section)  Progress towards OT goals: Progressing toward goals     Plan      Co-evaluation    PT/OT/SLP Co-Evaluation/Treatment: Yes Reason for Co-Treatment: For patient/therapist safety PT goals addressed during session: Mobility/safety with mobility OT goals addressed during session: ADL's and self-care      AM-PAC PT "6 Clicks" Daily Activity     Outcome Measure   Help from another person eating meals?: A Little Help from another person taking care of personal grooming?: A Little Help from another person toileting, which includes using toliet, bedpan, or urinal?: Total Help from another person bathing (including washing, rinsing, drying)?: A Lot Help from another person to put on and taking off regular upper body clothing?: A Lot Help from another person to put on and taking off regular lower body clothing?: Total 6 Click Score: 12    End of Session    OT Visit Diagnosis: Pain Pain - Right/Left: Left Pain - part of body: Hip   Activity Tolerance Patient tolerated treatment well   Patient Left in bed;with call bell/phone within reach;with bed alarm set   Nurse Communication           Time: 1610-9604 OT Time Calculation (min): 19 min  Charges: OT General Charges $OT Visit: 1 Visit OT Treatments $Self Care/Home Management : 8-22 mins  Marica Otter, OTR/L [phone] 01/08/2017   Shaine Mount 01/08/2017, 3:26 PM

## 2017-01-08 NOTE — Care Management Important Message (Signed)
Important Message  Patient Details  Name: Benjamin Banks Ozturk MRN: [id number] Date of Birth: 1925-11-22   Medicare Important Message Given:  Yes    Caren MacadamFuller, Anyelina Claycomb 01/08/2017, 11:45 AMImportant Message  Patient Details  Name: Benjamin Banks Victory MRN: [id number] Date of Birth: 1925-11-22   Medicare Important Message Given:  Yes    Caren MacadamFuller, Caycee Wanat 01/08/2017, 11:44 AM

## 2017-01-09 DIAGNOSIS — R41841 Cognitive communication deficit: Secondary | ICD-10-CM | POA: Diagnosis not present

## 2017-01-09 DIAGNOSIS — I10 Essential (primary) hypertension: Secondary | ICD-10-CM | POA: Diagnosis not present

## 2017-01-09 DIAGNOSIS — R410 Disorientation, unspecified: Secondary | ICD-10-CM | POA: Diagnosis not present

## 2017-01-09 DIAGNOSIS — F0391 Unspecified dementia with behavioral disturbance: Secondary | ICD-10-CM | POA: Diagnosis not present

## 2017-01-09 DIAGNOSIS — R2689 Other abnormalities of gait and mobility: Secondary | ICD-10-CM | POA: Diagnosis not present

## 2017-01-09 DIAGNOSIS — F332 Major depressive disorder, recurrent severe without psychotic features: Secondary | ICD-10-CM | POA: Diagnosis not present

## 2017-01-09 DIAGNOSIS — I480 Paroxysmal atrial fibrillation: Secondary | ICD-10-CM | POA: Diagnosis not present

## 2017-01-09 DIAGNOSIS — S72009A Fracture of unspecified part of neck of unspecified femur, initial encounter for closed fracture: Secondary | ICD-10-CM | POA: Diagnosis not present

## 2017-01-09 DIAGNOSIS — E039 Hypothyroidism, unspecified: Secondary | ICD-10-CM | POA: Diagnosis not present

## 2017-01-09 DIAGNOSIS — R4182 Altered mental status, unspecified: Secondary | ICD-10-CM | POA: Diagnosis not present

## 2017-01-09 DIAGNOSIS — Z4789 Encounter for other orthopedic aftercare: Secondary | ICD-10-CM | POA: Diagnosis not present

## 2017-01-09 DIAGNOSIS — F322 Major depressive disorder, single episode, severe without psychotic features: Secondary | ICD-10-CM | POA: Diagnosis not present

## 2017-01-09 DIAGNOSIS — N390 Urinary tract infection, site not specified: Secondary | ICD-10-CM | POA: Diagnosis not present

## 2017-01-09 DIAGNOSIS — E46 Unspecified protein-calorie malnutrition: Secondary | ICD-10-CM | POA: Diagnosis not present

## 2017-01-09 DIAGNOSIS — M6281 Muscle weakness (generalized): Secondary | ICD-10-CM | POA: Diagnosis not present

## 2017-01-09 DIAGNOSIS — S72002A Fracture of unspecified part of neck of left femur, initial encounter for closed fracture: Secondary | ICD-10-CM | POA: Diagnosis not present

## 2017-01-09 DIAGNOSIS — R278 Other lack of coordination: Secondary | ICD-10-CM | POA: Diagnosis not present

## 2017-01-09 LAB — BASIC METABOLIC PANEL
Anion gap: 6 (ref 5–15)
BUN: 17 mg/dL (ref 6–20)
CHLORIDE: 106 mmol/L (ref 101–111)
CO2: 25 mmol/L (ref 22–32)
CREATININE: 0.78 mg/dL (ref 0.61–1.24)
Calcium: 8.5 mg/dL — ABNORMAL LOW (ref 8.9–10.3)
Glucose, Bld: 94 mg/dL (ref 65–99)
POTASSIUM: 3.8 mmol/L (ref 3.5–5.1)
SODIUM: 137 mmol/L (ref 135–145)

## 2017-01-09 MED ORDER — HALOPERIDOL 1 MG PO TABS: 1.0000 mg | ORAL_TABLET | Freq: Four times a day (QID) | ORAL | 0 refills | Status: DC | PRN

## 2017-01-09 MED ORDER — BUSPIRONE HCL 5 MG PO TABS: 5.0000 mg | ORAL_TABLET | Freq: Two times a day (BID) | ORAL | 0 refills | Status: DC

## 2017-01-09 MED ORDER — LORAZEPAM 0.5 MG PO TABS
0.5000 mg | ORAL_TABLET | Freq: Four times a day (QID) | ORAL | 0 refills | Status: AC | PRN
Start: 2017-01-09 — End: 2017-02-08

## 2017-01-09 MED ORDER — METOPROLOL SUCCINATE ER 25 MG PO TB24: 25.0000 mg | ORAL_TABLET | Freq: Every day | ORAL | 0 refills | Status: DC

## 2017-01-09 MED ORDER — SERTRALINE HCL 25 MG PO TABS: 25.0000 mg | ORAL_TABLET | Freq: Every day | ORAL | 0 refills | Status: DC

## 2017-01-09 MED ORDER — RISPERIDONE 0.5 MG PO TABS: 0.5000 mg | ORAL_TABLET | Freq: Every day | ORAL | 0 refills | Status: DC

## 2017-01-09 MED ORDER — LORAZEPAM 1 MG PO TABS
1.0000 mg | ORAL_TABLET | Freq: Once | ORAL | Status: AC
  Administered 2017-01-09: 1 mg via ORAL
  Filled 2017-01-09: qty 1

## 2017-01-09 MED ORDER — ACETAMINOPHEN 325 MG PO TABS: 650.0000 mg | ORAL_TABLET | Freq: Four times a day (QID) | ORAL | 0 refills | Status: DC | PRN

## 2017-01-09 MED ORDER — MIRTAZAPINE 15 MG PO TABS: 15.0000 mg | ORAL_TABLET | Freq: Every day | ORAL | 0 refills | Status: DC

## 2017-01-09 MED ORDER — ENSURE ENLIVE PO LIQD: 237.0000 mL | Freq: Two times a day (BID) | ORAL | 12 refills | Status: DC

## 2017-01-09 MED ORDER — SENNOSIDES-DOCUSATE SODIUM 8.6-50 MG PO TABS: 1.0000 | ORAL_TABLET | Freq: Every evening | ORAL | 0 refills | Status: DC | PRN

## 2017-01-09 NOTE — Clinical Social Work Placement (Signed)
    10:36 AM Family chooses bed at [facility] Patient will transport by [facility]. LCSW confirmed bed with facility. LCSW faxed dc paperwork via HUB. LCSW notified family of dc.  RN report #: [id number]  LCSW signing off. No further CSW needs.   CLINICAL SOCIAL WORK PLACEMENT  NOTE  Date:  01/09/2017  Patient Details  Name: Benjamin Banks MRN: [id number] Date of Birth: Jun 19, 1925  Clinical Social Work is seeking post-discharge placement for this patient at the Skilled  Nursing Facility level of care (*CSW will initial, date and re-position this form in  chart as items are completed):  Yes   Patient/family provided with [facility] Clinical Social Work Department's list of facilities offering this level of care within the geographic area requested by the patient (or if unable, by the patient's family).  Yes   Patient/family informed of their freedom to choose among providers that offer the needed level of care, that participate in Medicare, Medicaid or managed care program needed by the patient, have an available bed and are willing to accept the patient.      Patient/family informed of [facility]'s ownership interest in [facility] and [facility], as well as of the fact that they are under no obligation to receive care at these facilities.  PASRR submitted to EDS on       PASRR number received on 01/02/17     Existing PASRR number confirmed on 01/02/17     FL2 transmitted to all facilities in geographic area requested by pt/family on 01/02/17     FL2 transmitted to all facilities within larger geographic area on       Patient informed that his/her managed care company has contracts with or will negotiate with certain facilities, including the following:        Yes   Patient/family informed of bed offers received.  [facility]     Physician recommends and [facility]    [facility]  [facility] on 01[facility].  Patient to be transferred to facility by EMS     Patient family notified on 01/09/17 of transfer.  Name of family member notified:  Nadyne CoombesJudy Lambert, [place]     PHYSICIAN       Additional Comment:    _______________________________________________ Coralyn HellingBernette Shayonna Ocampo, LCSW 01/09/2017, 10:36 AM

## 2017-01-09 NOTE — Discharge Instructions (Signed)
°Dr. Brian Swinteck °Joint Replacement Specialist °[facility] °[address] °[place], [place] [address] °[phone] ° ° °TOTAL HIP REPLACEMENT POSTOPERATIVE DIRECTIONS ° ° ° °Hip Rehabilitation, Guidelines Following Surgery  ° °WEIGHT BEARING °Weight bearing as tolerated with assist device (walker, cane, etc) as directed, use it as long as suggested by your surgeon or therapist, typically at least 4-6 weeks. ° °The results of a hip operation are greatly improved after range of motion and muscle strengthening exercises. Follow all safety measures which are given to protect your hip. If any of these exercises cause increased pain or swelling in your joint, decrease the amount until you are comfortable again. Then slowly increase the exercises. Call your caregiver if you have problems or questions.  ° °HOME CARE INSTRUCTIONS  °Most of the following instructions are designed to prevent the dislocation of your new hip.  °Remove items at home which could result in a fall. This includes throw rugs or furniture in walking pathways.  °Continue medications as instructed at time of discharge. °· You may have some home medications which will be placed on hold until you complete the course of blood thinner medication. °· You may start showering once you are discharged home. Do not remove your dressing. °Do not put on socks or shoes without following the instructions of your caregivers.   °Sit on chairs with arms. Use the chair arms to help push yourself up when arising.  °Arrange for the use of a toilet seat elevator so you are not sitting low.  °· Walk with walker as instructed.  °You may resume a sexual relationship in one month or when given the OK by your caregiver.  °Use walker as long as suggested by your caregivers.  °You may put full weight on your legs and walk as much as is comfortable. °Avoid periods of inactivity such as sitting longer than an hour when not asleep. This helps prevent  blood clots.  °You may return to work once you are cleared by your surgeon.  °Do not drive a car for 6 weeks or until released by your surgeon.  °Do not drive while taking narcotics.  °Wear elastic stockings for two weeks following surgery during the day but you may remove then at night.  °Make sure you keep all of your appointments after your operation with all of your doctors and caregivers. You should call the office at the above phone number and make an appointment for approximately two weeks after the date of your surgery. °Please pick up a stool softener and laxative for home use as long as you are requiring pain medications. °· ICE to the affected hip every three hours for 30 minutes at a time and then as needed for pain and swelling. Continue to use ice on the hip for pain and swelling from surgery. You may notice swelling that will progress down to the foot and ankle.  This is normal after surgery.  Elevate the leg when you are not up walking on it.   °It is important for you to complete the blood thinner medication as prescribed by your doctor. °· Continue to use the breathing machine which will help keep your temperature down.  It is common for your temperature to cycle up and down following surgery, especially at night when you are not up moving around and exerting yourself.  The breathing machine keeps your lungs expanded and your temperature down. ° °RANGE OF MOTION AND STRENGTHENING EXERCISES  °These exercises are   designed to help you keep full movement of your hip joint. Follow your caregiver's or physical therapist's instructions. Perform all exercises about fifteen times, three times per day or as directed. Exercise both hips, even if you have had only one joint replacement. These exercises can be done on a training (exercise) mat, on the floor, on a table or on a bed. Use whatever works the best and is most comfortable for you. Use music or television while you are exercising so that the exercises  are a pleasant break in your day. This will make your life better with the exercises acting as a break in routine you can look forward to.  °Lying on your back, slowly slide your foot toward your buttocks, raising your knee up off the floor. Then slowly slide your foot back down until your leg is straight again.  °Lying on your back spread your legs as far apart as you can without causing discomfort.  °Lying on your side, raise your upper leg and foot straight up from the floor as far as is comfortable. Slowly lower the leg and repeat.  °Lying on your back, tighten up the muscle in the front of your thigh (quadriceps muscles). You can do this by keeping your leg straight and trying to raise your heel off the floor. This helps strengthen the largest muscle supporting your knee.  °Lying on your back, tighten up the muscles of your buttocks both with the legs straight and with the knee bent at a comfortable angle while keeping your heel on the floor.  ° °SKILLED REHAB INSTRUCTIONS: °If the patient is transferred to a skilled rehab facility following release from the hospital, a list of the current medications will be sent to the facility for the patient to continue.  When discharged from the skilled rehab facility, please have the facility set up the patient's Home Health Physical Therapy prior to being released. Also, the skilled facility will be responsible for providing the patient with their medications at time of release from the facility to include their pain medication and their blood thinner medication. If the patient is still at the rehab facility at time of the two week follow up appointment, the skilled rehab facility will also need to assist the patient in arranging follow up appointment in our office and any transportation needs. ° °MAKE SURE YOU:  °Understand these instructions.  °Will watch your condition.  °Will get help right away if you are not doing well or get worse. ° °Pick up stool softner and  laxative for home use following surgery while on pain medications. °Do not remove your dressing. °The dressing is waterproof--it is OK to take showers. °Continue to use ice for pain and swelling after surgery. °Do not use any lotions or creams on the incision until instructed by your surgeon. °Total Hip Protocol. ° ° °

## 2017-01-09 NOTE — Progress Notes (Signed)
Date: January 09, 2017 Chart review for discharge needs:  None found for case management. Patient has no questions concerning post hospital care. 

## 2017-01-09 NOTE — Discharge Summary (Signed)
Physician Discharge Summary  Benjamin Banks ZOX:[id number] DOB: 1925-03-31 DOA: 106-09-2016  PCP: Merlene Laughter, MD  Admit date: 106-09-2016 Discharge date: 01/09/2017  Recommendations for Outpatient Follow-up:  1. Follow up with PCP in 1 week after discharge   Discharge Diagnoses:  Principal Problem:   Closed left hip fracture, initial encounter [facility]) Active Problems:   Essential hypertension   Dementia   Depression   Hypothyroidism   Leukopenia   Thrombocytopenia (HCC)   Hypoxia   Acute lower UTI   Displaced fracture of left femoral neck (HCC)    Discharge Condition: stable   Diet recommendation: as tolerated   History of present illness:  83 year old male with hypothyroidism, dementia who presented to ED status post fall and has sustained left hip fracture. She underwent left hip hemiarthroplasty 12/31/2016 and developed post-operative psychosis and delirium.   Hospital Course:  Principal Problem:   Closed left hip fracture, initial encounter [facility]) / Displaced fracture of left femoral neck (HCC) - S/P left hip hemiarthroplasty 12/31/2016 by Dr. Samson Frederic - Continue aspirin   Active Problems:   Essential hypertension - On metoprolol     Hypokalemia - Supplemented and WNL    Dementia with delirium / Depression - Developed postoperative psychosis superimposed on his dementia - Continue Buspar and Remeron  - Stable     Hypothyroidism - Continue synthroid     Leukopenia / Thrombocytopenia (HCC) - Stable     Acute lower UTI due to E.Coli - Completed rocephin     Acute postoperative blood loss anemia - Hgb stable      Transient A fib - Continue aspirin and metoprolol - Stable     Moderate protein calorie malnutrition = Continue nutritional supplementation     DVT prophylaxis: SCD's Code Status: DNR/DNI Family Communication:  No family at the bedside     Consultants:   Ortho, Dr. Ranell Patrick   Procedures:   Left hip  hemiarthroplasty 12/30/2016  Antimicrobials:   Rocephin      Signed:  Manson Passey, MD  [facility] 01/09/2017, 7:24 AM  Pager #: [phone]  Time spent in minutes: more than 30 minutes   Discharge Exam: Vitals:   01/08/17 2122 01/09/17 0513  BP: 111/74 (!) 134/55  Pulse: 69 (!) 47  Resp: 20 (!) 9  Temp: 98.8 F (37.1 C) (!) 97.1 F (36.2 C)  SpO2:     Vitals:   01/08/17 0535 01/08/17 1414 01/08/17 2122 01/09/17 0513  BP: (!) 156/75 128/75 111/74 (!) 134/55  Pulse: 66 (!) 58 69 (!) 47  Resp: 20 18 20  (!) 9  Temp: 97.9 F (36.6 C) 97.9 F (36.6 C) 98.8 F (37.1 C) (!) 97.1 F (36.2 C)  TempSrc: Oral Oral Axillary Axillary  SpO2: 100% 99%    Weight:      Height:        General: Pt is not in acute distress Cardiovascular: Rate controlled, S1/S2 + Respiratory: Clear to auscultation bilaterally, no wheezing, no crackles, no rhonchi Abdominal: Soft, non tender, non distended, bowel sounds +, no guarding Extremities: no cyanosis, pulses palpable bilaterally DP and PT Neuro:not able to assess psychiatric status due to dementia   Discharge Instructions  Discharge Instructions    Call MD for:  persistant nausea and vomiting   Complete by:  As directed    Call MD for:  redness, tenderness, or signs of infection (pain, swelling, redness, odor or green/yellow discharge around incision site)   Complete by:  As directed  Call MD for:  severe uncontrolled pain   Complete by:  As directed    Diet - low sodium heart healthy   Complete by:  As directed    Increase activity slowly   Complete by:  As directed      Allergies as of 01/09/2017   No Active Allergies     Medication List    STOP taking these medications   hydrochlorothiazide 12.5 MG capsule Commonly known as:  MICROZIDE   NON FORMULARY     TAKE these medications   acetaminophen 325 MG tablet Commonly known as:  TYLENOL Take 2 tablets (650 mg total) by mouth every 6 (six) hours as  needed for mild pain (or Fever >/= 101).   aspirin 81 MG tablet Take 1 tablet (81 mg total) by mouth 2 (two) times daily with a meal. What changed:  when to take this   atorvastatin 10 MG tablet Commonly known as:  LIPITOR Take 10 mg by mouth daily.   busPIRone 5 MG tablet Commonly known as:  BUSPAR Take 1 tablet (5 mg total) by mouth 2 (two) times daily. What changed:    medication strength  how much to take   CERTAGEN PO Take 1 tablet by mouth daily.   docusate sodium 100 MG capsule Commonly known as:  COLACE Take 2 capsules (200 mg total) by mouth 2 (two) times daily. What changed:  when to take this   feeding supplement (ENSURE ENLIVE) Liqd Take 237 mLs by mouth 2 (two) times daily between meals.   feeding supplement (PRO-STAT SUGAR FREE 64) Liqd Take 30 mLs by mouth 2 (two) times daily.   haloperidol 1 MG tablet Commonly known as:  HALDOL Take 1 tablet (1 mg total) by mouth every 6 (six) hours as needed for agitation.   HYDROcodone-acetaminophen 5-325 MG tablet Commonly known as:  NORCO/VICODIN Take 1-2 tablets by mouth every 6 (six) hours as needed (left hip pain).   LORazepam 0.5 MG tablet Commonly known as:  ATIVAN Take 1 tablet (0.5 mg total) by mouth every 6 (six) hours as needed for anxiety.   metoprolol succinate 25 MG 24 hr tablet Commonly known as:  TOPROL-XL Take 1 tablet (25 mg total) by mouth daily.   mirtazapine 15 MG tablet Commonly known as:  REMERON Take 1 tablet (15 mg total) by mouth at bedtime.   risperiDONE 0.5 MG tablet Commonly known as:  RISPERDAL Take 1 tablet (0.5 mg total) by mouth at bedtime.   senna-docusate 8.6-50 MG tablet Commonly known as:  Senokot-S Take 1 tablet by mouth at bedtime as needed for mild constipation.   sertraline 25 MG tablet Commonly known as:  ZOLOFT Take 1 tablet (25 mg total) by mouth daily.   SYNTHROID 50 MCG tablet Generic drug:  levothyroxine Take 50 mcg by mouth daily before breakfast.       Follow-up Information    Swinteck, Arlys JohnBrian, MD. Schedule an appointment as soon as possible for a visit in 2 week(s).   Specialty:  Orthopedic Surgery Why:  For wound re-check Contact information: [address] [place] [address] [phone]        Merlene LaughterStoneking, Hal, MD. Schedule an appointment as soon as possible for a visit.   Specialty:  Internal Medicine Contact information: [address] [place] [address] [phone]            The results of significant diagnostics from this hospitalization (including imaging, microbiology, ancillary and laboratory) are  listed below for reference.    Significant Diagnostic Studies: Dg Chest 1 View  Result Date: 12/30/2016 CLINICAL DATA:  Initial evaluation for preoperative evaluation, left hip fracture. EXAM: CHEST 1 VIEW COMPARISON:  Prior radiograph from 04/28/2016. FINDINGS: Mild cardiomegaly, stable. Mediastinal silhouette within normal limits. Aortic atherosclerosis. Lungs hypoinflated. No focal infiltrates. No pulmonary edema or pleural effusion. No pneumothorax. No acute osseous abnormality. IMPRESSION: 1. Shallow lung inflation with no active cardiopulmonary disease identified. 2. Mild cardiomegaly, stable. Electronically Signed   By: Rise MuBenjamin  McClintock M.D.   On: 12/30/2016 00:10   Pelvis Portable  Result Date: 12/30/2016 CLINICAL DATA:  Post LEFT hip arthroplasty EXAM: PORTABLE PELVIS 1-2 VIEWS COMPARISON:  Portable exam 1553 hours compared to intraoperative images of 12/30/2016 FINDINGS: LEFT hip prosthesis identified in expected position. No definite fracture or dislocation. Bones appear demineralized. Scattered soft tissue gas. Catheter within urinary bladder. IMPRESSION: LEFT hip prosthesis without acute complication. Electronically Signed   By: Ulyses SouthwardMark  Boles M.D.   On: 12/30/2016 16:20   Dg Knee Left Port  Result Date: 12/30/2016 CLINICAL DATA:  Fall yesterday.  Left knee pain.   Initial encounter. EXAM: PORTABLE LEFT KNEE - 1-2 VIEW COMPARISON:  None. FINDINGS: No evidence of fracture, dislocation, or joint effusion. 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 Mild degenerative spurring of patella. Mild chondrocalcinosis also noted. Peripheral vascular calcification. IMPRESSION: No acute findings. Severe medial compartment osteoarthritis, with tibia varus. Electronically Signed   By: Myles RosenthalJohn  Stahl M.D.   On: 12/30/2016 08:49   Dg C-arm 61-120 Min-no Report  Result Date: 12/30/2016 Fluoroscopy was utilized by the requesting physician.  No radiographic interpretation.   Dg Hip Operative Unilat W Or W/o Pelvis Left  Result Date: 12/30/2016 CLINICAL DATA:  Status post total hip replacement on the left EXAM: OPERATIVE LEFT HIP 1 VIEW TECHNIQUE: Fluoroscopic spot image(s) were submitted for interpretation post-operatively. FLUOROSCOPY TIME:  0 minutes 18 seconds; 1.75 mGy; 2 acquired images COMPARISON:  December 29, 2016 FINDINGS: There is a total hip replacement on the left with prosthetic components well-seated. No fracture or dislocation. Visualized right hip joint appears normal. IMPRESSION: Prosthetic components for total hip replacement on the left appear well seated. No fracture or dislocation evident. Electronically Signed   By: Bretta BangWilliam  Woodruff III M.D.   On: 12/30/2016 14:40   Dg Hip Unilat With Pelvis 2-3 Views Left  Result Date: 129-Jun-202018 CLINICAL DATA:  Left hip pain after a fall this evening. EXAM: DG HIP (WITH OR WITHOUT PELVIS) 2-3V LEFT COMPARISON:  None. FINDINGS: Transverse fracture of the left femoral neck with superior subluxation of the distal fracture fragment resulting in varus angulation. No dislocation. No definite inter trochanteric extension although rotation limits evaluation. Pelvis appears intact. SI joints and symphysis pubis are not displaced. Degenerative changes in the lower lumbar spine. IMPRESSION: Transverse fracture left femoral  neck with varus angulation. No definite trochanteric extension although rotation limits evaluation. Electronically Signed   By: Burman NievesWilliam  Stevens M.D.   On: 129-Jun-202018 22:23    Microbiology: Recent Results (from the past 240 hour(s))  Culture, Urine     Status: Abnormal   Collection Time: 12/30/16  8:51 AM  Result Value Ref Range Status   Specimen Description URINE, CLEAN CATCH  Final   Special Requests NONE  Final   Culture >=100,000 COLONIES/mL ESCHERICHIA COLI (A)  Final   Report Status 01/01/2017 FINAL  Final   Organism ID, Bacteria ESCHERICHIA COLI (A)  Final      Susceptibility  Escherichia coli - MIC*    AMPICILLIN >=32 RESISTANT Resistant     CEFAZOLIN >=64 RESISTANT Resistant     CEFTRIAXONE <=1 SENSITIVE Sensitive     CIPROFLOXACIN >=4 RESISTANT Resistant     GENTAMICIN <=1 SENSITIVE Sensitive     IMIPENEM <=0.25 SENSITIVE Sensitive     NITROFURANTOIN <=16 SENSITIVE Sensitive     TRIMETH/SULFA <=20 SENSITIVE Sensitive     AMPICILLIN/SULBACTAM >=32 RESISTANT Resistant     PIP/TAZO 64 INTERMEDIATE Intermediate     Extended ESBL NEGATIVE Sensitive     * >=100,000 COLONIES/mL ESCHERICHIA COLI     Labs: Basic Metabolic Panel: Recent Labs  Lab 01/03/17 0624 01/05/17 0512 01/06/17 0555 01/08/17 0603 01/09/17 0552  NA 142 145 144 140 137  K 3.5 3.4* 3.4* 3.7 3.8  CL 106 112* 110 107 106  CO2 26 27 27 27 25   GLUCOSE 77 90 144* 96 94  BUN 21* 23* 23* 18 17  CREATININE 0.67 0.89 0.79 0.84 0.78  CALCIUM 8.3* 8.3* 8.8* 8.7* 8.5*   Liver Function Tests: No results for input(s): AST, ALT, ALKPHOS, BILITOT, PROT, ALBUMIN in the last 168 hours. No results for input(s): LIPASE, AMYLASE in the last 168 hours. No results for input(s): AMMONIA in the last 168 hours. CBC: Recent Labs  Lab 01/02/17 0851 01/03/17 0624 01/05/17 0512  WBC 6.7 6.4 5.4  HGB 10.6* 10.3* 9.2*  HCT 32.0* 30.6* 27.5*  MCV 95.5 94.7 96.2  PLT 220 229 262   Cardiac Enzymes: No results for  input(s): CKTOTAL, CKMB, CKMBINDEX, TROPONINI in the last 168 hours. BNP: BNP (last 3 results) No results for input(s): BNP in the last 8760 hours.  ProBNP (last 3 results) No results for input(s): PROBNP in the last 8760 hours.  CBG: No results for input(s): GLUCAP in the last 168 hours.

## 2017-01-09 NOTE — Progress Notes (Signed)
Report called to Alberta at [facility]. All questions answered.  

## 2017-01-09 NOTE — Progress Notes (Signed)
Social Work notified of discharge orders.

## 2017-02-04 DIAGNOSIS — Z7982 Long term (current) use of aspirin: Secondary | ICD-10-CM | POA: Diagnosis not present

## 2017-02-04 DIAGNOSIS — R2681 Unsteadiness on feet: Secondary | ICD-10-CM | POA: Diagnosis not present

## 2017-02-04 DIAGNOSIS — K5901 Slow transit constipation: Secondary | ICD-10-CM | POA: Diagnosis not present

## 2017-02-04 DIAGNOSIS — R638 Other symptoms and signs concerning food and fluid intake: Secondary | ICD-10-CM | POA: Diagnosis not present

## 2017-02-04 DIAGNOSIS — Z9181 History of falling: Secondary | ICD-10-CM | POA: Diagnosis not present

## 2017-02-04 DIAGNOSIS — F330 Major depressive disorder, recurrent, mild: Secondary | ICD-10-CM | POA: Diagnosis not present

## 2017-02-04 DIAGNOSIS — E039 Hypothyroidism, unspecified: Secondary | ICD-10-CM | POA: Diagnosis not present

## 2017-02-04 DIAGNOSIS — I10 Essential (primary) hypertension: Secondary | ICD-10-CM | POA: Diagnosis not present

## 2017-02-04 DIAGNOSIS — E781 Pure hyperglyceridemia: Secondary | ICD-10-CM | POA: Diagnosis not present

## 2017-03-03 ENCOUNTER — Other Ambulatory Visit: Payer: Self-pay | Admitting: Internal Medicine

## 2017-03-03 DIAGNOSIS — S72002A Fracture of unspecified part of neck of left femur, initial encounter for closed fracture: Secondary | ICD-10-CM

## 2017-03-03 DIAGNOSIS — F0391 Unspecified dementia with behavioral disturbance: Secondary | ICD-10-CM

## 2017-03-03 MED ORDER — HYDROCODONE-ACETAMINOPHEN 5-325 MG PO TABS: ORAL_TABLET | ORAL | 0 refills | Status: DC

## 2017-03-03 MED ORDER — LORAZEPAM 0.5 MG PO TABS: 0.5000 mg | ORAL_TABLET | Freq: Four times a day (QID) | ORAL | 1 refills | Status: DC | PRN

## 2017-03-17 ENCOUNTER — Non-Acute Institutional Stay (SKILLED_NURSING_FACILITY): Payer: Medicare Other | Admitting: Internal Medicine

## 2017-03-17 ENCOUNTER — Encounter: Payer: Self-pay | Admitting: Internal Medicine

## 2017-03-17 DIAGNOSIS — R296 Repeated falls: Secondary | ICD-10-CM

## 2017-03-17 DIAGNOSIS — E039 Hypothyroidism, unspecified: Secondary | ICD-10-CM | POA: Diagnosis not present

## 2017-03-17 DIAGNOSIS — F01518 Vascular dementia, unspecified severity, with other behavioral disturbance: Secondary | ICD-10-CM

## 2017-03-17 DIAGNOSIS — M810 Age-related osteoporosis without current pathological fracture: Secondary | ICD-10-CM

## 2017-03-17 DIAGNOSIS — R627 Adult failure to thrive: Secondary | ICD-10-CM

## 2017-03-17 DIAGNOSIS — D696 Thrombocytopenia, unspecified: Secondary | ICD-10-CM | POA: Diagnosis not present

## 2017-03-17 DIAGNOSIS — D72819 Decreased white blood cell count, unspecified: Secondary | ICD-10-CM | POA: Diagnosis not present

## 2017-03-17 DIAGNOSIS — S72002A Fracture of unspecified part of neck of left femur, initial encounter for closed fracture: Secondary | ICD-10-CM

## 2017-03-17 DIAGNOSIS — F0151 Vascular dementia with behavioral disturbance: Secondary | ICD-10-CM | POA: Diagnosis not present

## 2017-03-17 DIAGNOSIS — E785 Hyperlipidemia, unspecified: Secondary | ICD-10-CM | POA: Diagnosis not present

## 2017-03-17 DIAGNOSIS — I10 Essential (primary) hypertension: Secondary | ICD-10-CM

## 2017-03-17 NOTE — Progress Notes (Signed)
Patient ID: Benjamin Banks, male   DOB: Sep 23, 1925, 84 y.o.   MRN: [id number]  Provider:  Gwenith Spitziffany L. Renato Gailseed, D.O., C.M.D. [facility] Room [facility]: 314 Memory Care Place of Service:  SNF (31)  PCP: [name], Primitivo Merkey L, DO Patient Care Team: [name], Audryna Wendt L, DO as PCP - General (Geriatric Medicine) Samson FredericSwinteck, Brian, MD as Consulting Physician (Orthopedic Surgery)  Extended Emergency Contact Information Primary Emergency Contact: Lambeth,Judith Address: [address]          [place], [place] [address] [place] Home Phone: [phone] Mobile Phone: [phone] Relation: Relative Secondary Emergency Contact: Curley SpiceBowman,Una  [place] Home Phone: [phone] Relation: Niece  Code Status: DNR Goals of Care: Advanced Directive information Advanced Directives 12/30/2016  Does Patient Have a Medical Advance Directive? Yes  Type of Advance Directive Healthcare Power of Attorney  Does patient want to make changes to medical advance directive? No - Patient declined  Copy of Healthcare Power of Attorney in Chart? No - copy requested  Would patient like information on creating a medical advance directive? -   Chief Complaint  Patient presents with  . Establish Care    new patient  . New Admit To SNF    memory care admission  . ACP    DNR, HCPOA (made copies)    HPI: Patient is a 84 y.o. male seen today for admission to [facility] due to progressive dementia.  Records indicate he's had a prior head injury or some sort.  Upon review of the limited records available from his previous PCP, in march of last year, he had begun struggling in the home environment with medications-- missing them often and, therefore, having a grossly elevated TSH.    Per epic records, he sustained a fall 04/28/16 and was noted then to have dementia, failure to thrive, acute kidney injury, hypothyroidism, depression, htn,  hyperlipidemia and Gilbert's syndrome.  He had a right elbow wound infection at that time.  It was noted even then that he was partially dependent in ADLs.  Quote from that admission note 04/28/16 reads:  "Several concerned friends/neighbors are at bedside. Per report, pt has been slowly declining in the past several months. pt lives at home alone. Per his friends/neighbors, they have been having to provide more care for him by feeding him and making sure he takes his medicines. They state he has recently been having difficulty walking. He had an unwitnessed fall earlier today and sustained a head injury, developed hematoma in the back of his head and laceration. Today they noticed that all his pills were not in his pill box and they belive he may have taken all of them at once(?3 days worth). Pt has small wound in right elbow wound with infection which has not been treated. Per his friends/neighbors and nephew, they are working on establishing medical POA."  He had negative CTs of head and neck then for acute abnormalities but was admitted, hydrated and wound infection treated.  His severe hypothyroidism was noted with TSH over 65 (friends and nephew had noted 1-2 doses taken per week only due to his noncompliance) .  At that time, he was on zoloft alone for his depression.  He also had SIADH treated with 1500 cc fluid restriction and lasix at that time.  He was discharged to [facility] 05/03/16 to complete 5 more days of doxy. Repeat BMP was recommended.    Apparently, from there, he was discharged  to [facility] and sustained a fall there on 07/06/16 with a laceration of his left eye and skin tear to his right elbow and right knee.  In ED, lacerations were repaired.  CT brain was again negative for acute injury.  He was sent back to [facility].    11/23/16, he again sustained a fall at [facility] that was unwitnessed, had a right temporaparietal head laceration and staples were placed.  CT brain  was negative.  Rib xrays could not be done due to pt not staying still.  CT neck showed spinous process fxs of T1 and T2, but not tender there.  Discharged back to [place].  12/29/16, had a fall with left hip pain said to occur when his bed broke.  Left hip xray showed transverse fx of left femoral neck and he was admitted for surgery.  He was found to be hypoxic, leukopenic and thrombocytopenic, elevated h/h (?contracted).  Placed on O2.  He was seen by Dr. Ranell Patrick and then underwent his surgery on 12/30/16 for left hip hemiarthroplasty via anterior approach by Dr. Linna Caprice.  He was WBAT postop with asa 81mg  po bid for DVT prophylaxis "due to bleeding risk and falls" and PT, OT was ordered.  He had an episode of new onset afib on 01/01/17.  At discharge, he was on buspar and remeron.  He had been treated for a UTI due to E Coli with rocephin.  The afib had resolved, but he was continued on asa and metoprolol.  Nutritional supplements have been recommended all along.  This time, he was discharged to [facility] on 01/09/17.  It appears he has been there until his admission here at [facility].  I will need to speak with his HCPOA for more details, Uvaldo Bristle, his sister-in-law.      MMSE could not be done here.  It's not clear where all of his different psychiatric meds got added.  I've asked for records from [facility] for this information about what his psychiatric state was there.    Past Medical History:  Diagnosis Date  . Anemia   . Benign positional vertigo   . Bilateral carpal tunnel syndrome   . BPH (benign prostatic hyperplasia)   . Chest pain    noncardiac  . Chronic anal fissure   . Chronic low back pain   . Dementia   . Disease    perrone  . Diverticulosis   . DJD (degenerative joint disease)   . Elevated blood sugar   . GERD (gastroesophageal reflux disease)   . Gilbert's syndrome   . H/O thyroidectomy   . Heart murmur   . Hypercholesteremia   .  Hypertension    labils  . Hyperthyroidism   . Iron deficiency   . Lichen planus   . Peripheral neuropathy   . Polymyalgia rheumatica (HCC)    Past Surgical History:  Procedure Laterality Date  . APPENDECTOMY    . bottom teeth removed with implants    . hemrrhoidectomy    . left and right cataract    . left lens implant    . THYROIDECTOMY, PARTIAL    . TONSILLECTOMY    . TOTAL HIP ARTHROPLASTY Left 12/30/2016   Procedure: LEFT HIP HEMIARTHROPLASTY  ANTERIOR APPROACH;  Surgeon: Samson Frederic, MD;  Location: [facility];  Service: Orthopedics;  Laterality: Left;    reports that he has quit smoking. he has never used smokeless tobacco. He reports that he drinks alcohol. He reports that he  does not use drugs. Social History   Socioeconomic History  . Marital status: Unknown    Spouse name: Not on file  . Number of children: Not on file  . Years of education: Not on file  . Highest education level: Not on file  Social Needs  . Financial resource strain: Not on file  . Food insecurity - worry: Not on file  . Food insecurity - inability: Not on file  . Transportation needs - medical: Not on file  . Transportation needs - non-medical: Not on file  Occupational History  . Not on file  Tobacco Use  . Smoking status: Former Games developer  . Smokeless tobacco: Never Used  Substance and Sexual Activity  . Alcohol use: Yes    Comment: RARE  . Drug use: No  . Sexual activity: Not on file  Other Topics Concern  . Not on file  Social History Narrative  . Not on file    Functional Status Survey:  dependent in adls, ambulates with walker  Family History  Problem Relation Age of Onset  . COPD Mother   . COPD Father   . Alcohol abuse Father     Health Maintenance  Topic Date Due  . TETANUS/TDAP  02/09/1947  . PNA vac Low Risk Adult (1 of 2 - PCV13) 02/08/1993  . INFLUENZA VACCINE  09/10/2016    No Known Allergies  Outpatient Encounter Medications as of 03/17/2017  Medication Sig    . aspirin 81 MG tablet Take 1 tablet (81 mg total) by mouth 2 (two) times daily with a meal.  . atorvastatin (LIPITOR) 10 MG tablet Take 10 mg by mouth daily.  . busPIRone (BUSPAR) 5 MG tablet Take 1 tablet (5 mg total) by mouth 2 (two) times daily.  Marland Kitchen docusate sodium (COLACE) 100 MG capsule Take 200 mg by mouth 2 (two) times daily as needed for mild constipation.  Marland Kitchen HYDROcodone-acetaminophen (NORCO/VICODIN) 5-325 MG tablet Take 1 tablet by mouth every 6 (six) hours as needed for moderate pain. Take 2 tablets by mouth as needed for severe pain  . levothyroxine (SYNTHROID, LEVOTHROID) 88 MCG tablet Take 88 mcg by mouth daily before breakfast.  . LORazepam (ATIVAN) 0.5 MG tablet Take 1 tablet (0.5 mg total) by mouth every 6 (six) hours as needed for anxiety.  . metoprolol succinate (TOPROL-XL) 25 MG 24 hr tablet Take 1 tablet (25 mg total) by mouth daily.  . mirtazapine (REMERON) 15 MG tablet Take 1 tablet (15 mg total) by mouth at bedtime.  . Multiple Vitamins-Minerals (CERTAVITE SENIOR/ANTIOXIDANT) TABS Take 1 tablet by mouth daily.  . risperiDONE (RISPERDAL) 0.5 MG tablet Take 1 tablet (0.5 mg total) by mouth at bedtime.  . senna-docusate (SENOKOT-S) 8.6-50 MG tablet Take 1 tablet by mouth at bedtime as needed for mild constipation.  . sertraline (ZOLOFT) 25 MG tablet Take 1 tablet (25 mg total) by mouth daily.  . [DISCONTINUED] acetaminophen (TYLENOL) 325 MG tablet Take 2 tablets (650 mg total) by mouth every 6 (six) hours as needed for mild pain (or Fever >/= 101).  . [DISCONTINUED] Amino Acids-Protein Hydrolys (FEEDING SUPPLEMENT, PRO-STAT SUGAR FREE 64,) LIQD Take 30 mLs by mouth 2 (two) times daily.  . [DISCONTINUED] docusate sodium (COLACE) 100 MG capsule Take 2 capsules (200 mg total) by mouth 2 (two) times daily. (Patient taking differently: Take 200 mg daily after breakfast by mouth. )  . [DISCONTINUED] feeding supplement, ENSURE ENLIVE, (ENSURE ENLIVE) LIQD Take 237 mLs by mouth 2 (two)  times  daily between meals.  . [DISCONTINUED] haloperidol (HALDOL) 1 MG tablet Take 1 tablet (1 mg total) by mouth every 6 (six) hours as needed for agitation.  . [DISCONTINUED] HYDROcodone-acetaminophen (NORCO/VICODIN) 5-325 MG tablet 1 tab po q 6hr prn moderate pain and 2 tabs po q6 hr prn severe pain  . [DISCONTINUED] Multiple Vitamins-Minerals (CERTAGEN PO) Take 1 tablet by mouth daily.  . [DISCONTINUED] SYNTHROID 50 MCG tablet Take 50 mcg by mouth daily before breakfast.    No facility-administered encounter medications on file as of 03/17/2017.     Review of Systems  Unable to perform ROS: Dementia (pt spoke very few words; only yelled come in when someone was hammering and he thought they were knocking on his door, info obtained from staff)  Constitutional: Positive for activity change and unexpected weight change. Negative for appetite change, chills and fever.  Respiratory: Negative for chest tightness and shortness of breath.   Cardiovascular: Negative for leg swelling.  Gastrointestinal: Negative for constipation.  Musculoskeletal: Positive for gait problem.  Skin: Negative for color change.  Neurological: Positive for weakness. Negative for dizziness.  Hematological: Bruises/bleeds easily.  Psychiatric/Behavioral: Positive for agitation, behavioral problems and confusion.       Depression; severe agitation and combative behavior, trying to throw himself out of wheelchair 1/28; resistive and combative with care 1/29 am; hitting, scratching and kicking staff and trying to sit on the floor instead of the toilet when staff assisting him 1/30; he was taken to the toilet in the wheelchair on 1/31 b/c he refused to walk, then slid "purposefully" from toilet to floor; 2/2 AM, he was anxious, agitated, combative and throwing his arms, legs and swinging at staff, tried to fall forward from wheelchair; PM of 2/2, kicked at staff, tried to hit them and refused meds; 2/3, wouldn't leave room; 2/4,  am, tried to hit staff helping him to bathroom    Vitals:   03/17/17 1340  BP: (!) 157/68  Pulse: 60  Resp: 17  Temp: (!) 97.4 F (36.3 C)  TempSrc: Oral  SpO2: 98%  Weight: 132 lb (59.9 kg)   Body mass index is 18.94 kg/m. Physical Exam  Constitutional:  Cachectic white male sitting up in chair in his room, frequently will open his eyes when I speak to him or ask questions, just stare, then close them again without speaking; easily startled  HENT:  Head: Normocephalic and atraumatic.  Right Ear: External ear normal.  Left Ear: External ear normal.  Nose: Nose normal.  Mouth/Throat: Oropharynx is clear and moist.  Eyes: Conjunctivae are normal. Pupils are equal, round, and reactive to light.  Neck: Neck supple. No JVD present. No thyromegaly present.  Cardiovascular: Normal rate, regular rhythm, normal heart sounds and intact distal pulses.  Pulmonary/Chest: Effort normal and breath sounds normal. No respiratory distress. He has no wheezes. He has no rales.  Abdominal: Soft. Bowel sounds are normal. He exhibits no distension. There is no tenderness.  Musculoskeletal: Normal range of motion. He exhibits no tenderness.  No evidence of pain when left hip is examined where he had his fracture  Lymphadenopathy:    He has no cervical adenopathy.  Neurological:  Almost hypervigilant, but then will close eyes and nearly fall asleep  Skin: Skin is warm and dry.  Several small ecchymoses on arms and legs  Psychiatric:  Flat affect, stares and then closes eyes    Labs reviewed: Basic Metabolic Panel: Recent Labs    12/31/16 0602  01/06/17 0555 01/08/17  [facility] 01/09/17 0552  NA 138   < > 144 140 137  K 3.8   < > 3.4* 3.7 3.8  CL 107   < > 110 107 106  CO2 27   < > 27 27 25   GLUCOSE 102*   < > 144* 96 94  BUN 32*   < > 23* 18 17  CREATININE 0.91   < > 0.79 0.84 0.78  CALCIUM 8.1*   < > 8.8* 8.7* 8.5*  MG 2.0  --   --   --   --    < > = values in this interval not  displayed.   Liver Function Tests: No results for input(s): AST, ALT, ALKPHOS, BILITOT, PROT, ALBUMIN in the last 8760 hours. No results for input(s): LIPASE, AMYLASE in the last 8760 hours. No results for input(s): AMMONIA in the last 8760 hours. CBC: Recent Labs    12/30/16 0611 12/30/16 0901 12/31/16 0602  01/02/17 0851 01/03/17 0624 01/05/17 0512  WBC QUESTIONABLE RESULTS, RECOMMEND RECOLLECT TO VERIFY 13.8* 9.9   < > 6.7 6.4 5.4  NEUTROABS QUESTIONABLE RESULTS, RECOMMEND RECOLLECT TO VERIFY 12.5* 8.3*  --   --   --   --   HGB QUESTIONABLE RESULTS, RECOMMEND RECOLLECT TO VERIFY 10.6* 9.5*   < > 10.6* 10.3* 9.2*  HCT QUESTIONABLE RESULTS, RECOMMEND RECOLLECT TO VERIFY 32.1* 28.9*   < > 32.0* 30.6* 27.5*  MCV QUESTIONABLE RESULTS, RECOMMEND RECOLLECT TO VERIFY 96.7 98.0   < > 95.5 94.7 96.2  PLT QUESTIONABLE RESULTS, RECOMMEND RECOLLECT TO VERIFY 227 195   < > 220 229 262   < > = values in this interval not displayed.   Cardiac Enzymes: No results for input(s): CKTOTAL, CKMB, CKMBINDEX, TROPONINI in the last 8760 hours. BNP: Invalid input(s): POCBNP No results found for: HGBA1C Lab Results  Component Value Date   TSH 64.685 (H) 04/29/2016   Lab Results  Component Value Date   VITAMINB12 324 12/31/2016   Lab Results  Component Value Date   FOLATE 20.9 12/31/2016   Lab Results  Component Value Date   IRON 18 (L) 12/31/2016   TIBC 214 (L) 12/31/2016   FERRITIN 103 12/31/2016    Imaging and Procedures obtained prior to SNF admission: CT head and neck 11/23/16:   1. No acute intracranial abnormalities. Chronic atrophy and small vessel ischemic changes. 2. Intracranial and cervical vascular calcifications. 3. Anterior subluxations in the cervical spine at multiple levels. Alignment is unchanged since previous study and is likely due to degenerative change. 4. Degenerative changes throughout the cervical spine. 5. Mildly displaced acute fractures of the spinous  processes of T2 and T3.  Dg Chest 1 View Result Date: 12/30/2016 CLINICAL DATA:  Initial evaluation for preoperative evaluation, left hip fracture. EXAM: CHEST 1 VIEW COMPARISON:  Prior radiograph from 04/28/2016. FINDINGS: Mild cardiomegaly, stable. Mediastinal silhouette within normal limits. Aortic atherosclerosis. Lungs hypoinflated. No focal infiltrates. No pulmonary edema or pleural effusion. No pneumothorax. No acute osseous abnormality. IMPRESSION: 1. Shallow lung inflation with no active cardiopulmonary disease identified. 2. Mild cardiomegaly, stable. Electronically Signed   By: Rise Mu M.D.   On: 12/30/2016 00:10   Pelvis Portable Result Date: 12/30/2016 CLINICAL DATA:  Post LEFT hip arthroplasty EXAM: PORTABLE PELVIS 1-2 VIEWS COMPARISON:  Portable exam 1553 hours compared to intraoperative images of 12/30/2016 FINDINGS: LEFT hip prosthesis identified in expected position. No definite fracture or dislocation. Bones appear demineralized. Scattered soft tissue gas. Catheter within urinary bladder. IMPRESSION: LEFT  hip prosthesis without acute complication. Electronically Signed   By: Ulyses Southward M.D.   On: 12/30/2016 16:20   Dg Knee Left Port Result Date: 12/30/2016 CLINICAL DATA:  Fall yesterday.  Left knee pain.  Initial encounter. EXAM: PORTABLE LEFT KNEE - 1-2 VIEW COMPARISON:  None. FINDINGS: No evidence of fracture, dislocation, or joint effusion. 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 Mild degenerative spurring of patella. Mild chondrocalcinosis also noted. Peripheral vascular calcification. IMPRESSION: No acute findings. Severe medial compartment osteoarthritis, with tibia varus. Electronically Signed   By: Myles Rosenthal M.D.   On: 12/30/2016 08:49   Dg C-arm 61-120 Min-no Report  Result Date: 12/30/2016 Fluoroscopy was utilized by the requesting physician.  No radiographic interpretation.   Dg Hip Operative Unilat W Or W/o Pelvis Left  Result  Date: 12/30/2016 CLINICAL DATA:  Status post total hip replacement on the left EXAM: OPERATIVE LEFT HIP 1 VIEW TECHNIQUE: Fluoroscopic spot image(s) were submitted for interpretation post-operatively. FLUOROSCOPY TIME:  0 minutes 18 seconds; 1.75 mGy; 2 acquired images COMPARISON:  December 29, 2016 FINDINGS: There is a total hip replacement on the left with prosthetic components well-seated. No fracture or dislocation. Visualized right hip joint appears normal. IMPRESSION: Prosthetic components for total hip replacement on the left appear well seated. No fracture or dislocation evident. Electronically Signed   By: Bretta Bang III M.D.   On: 12/30/2016 14:40   Dg Hip Unilat With Pelvis 2-3 Views Left  Result Date: 128-Nov-202018 CLINICAL DATA:  Left hip pain after a fall this evening. EXAM: DG HIP (WITH OR WITHOUT PELVIS) 2-3V LEFT COMPARISON:  None. FINDINGS: Transverse fracture of the left femoral neck with superior subluxation of the distal fracture fragment resulting in varus angulation. No dislocation. No definite inter trochanteric extension although rotation limits evaluation. Pelvis appears intact. SI joints and symphysis pubis are not displaced. Degenerative changes in the lower lumbar spine. IMPRESSION: Transverse fracture left femoral neck with varus angulation. No definite trochanteric extension although rotation limits evaluation. Electronically Signed   By: Burman Nieves M.D.   On: 128-Nov-202018 22:23    Assessment/Plan 1. Vascular dementia with behavior disturbance -advanced, is now in SNF memory care -having behavioral difficulties during care -on a complex psychiatric regimen already with buspar, ativan, remeron, risperdal and zoloft--at risk for serotonin syndrome -despite all of these and his lethargic state, he's apparently quite combative and agitated during care -staff are not aware of any pain he is having at that point that they can detect and I could not find any tender  points during examination -he is dependent in adls and ambulatory with a walker -as always, I'm not a fan of the benzos and antipsychotics for him if alternatives are possible -will involve geriatric psychiatry to assist with this case  2. Displaced fracture of left femoral neck (HCC) -in November, prognosis poor at this point since this event and already severe failure to thrive in his home, then [place] environments before the fracture based on my extensive record review -might consider adding tylenol scheduled for pain to see if this decreases his agitation with care  3. Leukopenia, unspecified type -f/u cbc  4. Thrombocytopenia (HCC) -f/u cbc  5. Failure to thrive in adult -has been going on at least for the past year based on records -involve dietitian -encourage po intake of whatever he likes and will take, nutritional supplements, on remeron, but weight remains low -I also question if he has a bone marrow process with his cbc being  abnormal--need more recent one and [facility] records  6. Acquired hypothyroidism -cont levothyroxine and f/u tsh which has been historically high -was missing doses -if abnormal 2x here or we find it was abnormal at [facility] and dose not changed, would adjust dose (of course takes 4-6 wks for change in value)  7. Essential hypertension -bp elevated today, monitor--goal less than 150/90 and not to be orthostatic; cont toprol xl for now  8. Hyperlipidemia, unspecified hyperlipidemia type -would plan to d/c lipitor  9. Recurrent falls -PT, OT eval and tx and monitor; careful med selection  10. Senile osteoporosis -has had hip fx and very frail, would not add vitamins at this point given med refusals and poor prognosis  Family/ staff Communication: discussed with memory care nurse, nurse manager, DON  Labs/tests ordered:  F/u cbc with diff, bmp, tsh  Ishani Goldwasser L. Akshitha Culmer, D.O. Geriatrics [facility] [address], [place] [address] Cell Phone (Mon-Fri 8am-5pm):  [phone] On Call:  [phone] & follow prompts after 5pm & weekends Office Phone:  [phone] Office Fax:  [phone]

## 2017-03-18 ENCOUNTER — Encounter: Payer: Self-pay | Admitting: Internal Medicine

## 2017-03-18 LAB — CBC AND DIFFERENTIAL
HCT: 31 — AB (ref 41–53)
Hemoglobin: 10 — AB (ref 13.5–17.5)
Platelets: 339 (ref 150–399)
WBC: 5.8

## 2017-03-18 LAB — TSH: TSH: 18.25 — AB (ref 0.41–5.90)

## 2017-03-18 LAB — HEPATIC FUNCTION PANEL
ALT: 9 — AB (ref 10–40)
AST: 16 (ref 14–40)
Alkaline Phosphatase: 117 (ref 25–125)
Bilirubin, Total: 0.4

## 2017-03-18 LAB — BASIC METABOLIC PANEL
BUN: 17 (ref 4–21)
Creatinine: 0.7 (ref 0.6–1.3)
Glucose: 93
Potassium: 4.5 (ref 3.4–5.3)
Sodium: 139 (ref 137–147)

## 2017-03-18 LAB — VITAMIN B12: Vitamin B-12: 341

## 2017-03-20 ENCOUNTER — Encounter: Payer: Self-pay | Admitting: Internal Medicine

## 2017-03-20 ENCOUNTER — Other Ambulatory Visit: Payer: Self-pay | Admitting: Adult Health

## 2017-03-20 MED ORDER — HYDROCODONE-ACETAMINOPHEN 5-325 MG PO TABS: 2.0000 | ORAL_TABLET | ORAL | 0 refills | Status: DC

## 2017-03-22 DIAGNOSIS — F01518 Vascular dementia, unspecified severity, with other behavioral disturbance: Secondary | ICD-10-CM | POA: Insufficient documentation

## 2017-03-22 DIAGNOSIS — F0151 Vascular dementia with behavioral disturbance: Secondary | ICD-10-CM | POA: Insufficient documentation

## 2017-03-22 DIAGNOSIS — R296 Repeated falls: Secondary | ICD-10-CM | POA: Insufficient documentation

## 2017-03-26 ENCOUNTER — Non-Acute Institutional Stay (SKILLED_NURSING_FACILITY): Payer: Medicare Other | Admitting: Adult Health

## 2017-03-26 ENCOUNTER — Encounter: Payer: Self-pay | Admitting: Adult Health

## 2017-03-26 DIAGNOSIS — S22038D Other fracture of third thoracic vertebra, subsequent encounter for fracture with routine healing: Secondary | ICD-10-CM

## 2017-03-26 DIAGNOSIS — S22009A Unspecified fracture of unspecified thoracic vertebra, initial encounter for closed fracture: Secondary | ICD-10-CM | POA: Insufficient documentation

## 2017-03-26 DIAGNOSIS — S72002D Fracture of unspecified part of neck of left femur, subsequent encounter for closed fracture with routine healing: Secondary | ICD-10-CM | POA: Diagnosis not present

## 2017-03-26 MED ORDER — HYDROCODONE-ACETAMINOPHEN 5-325 MG PO TABS: 1.0000 | ORAL_TABLET | Freq: Two times a day (BID) | ORAL | 0 refills | Status: DC

## 2017-03-26 NOTE — Assessment & Plan Note (Signed)
Salonpas patch to T2-3 area on in the am and off in the pm Monitor for improvement.

## 2017-03-26 NOTE — Assessment & Plan Note (Signed)
?   If this is the reason for some of his "pain" or agitation. I was not able to tease out during my visit whether he was in pain or there was agitation. He has appeared to be in pain per the staff and the therapy dept. Scheduled norco helped minimally. We will try the below plan and monitor for improvement. Given his advanced dementia and poor quality of life our goal is to keep him comfortable. He is not able to return to ortho due to his behaviors. Celebrex 200 mg qam with food x 2 weeks Change Norco to 1 tab in the am and 1 tab in the pm

## 2017-03-26 NOTE — Progress Notes (Signed)
Location:  Medical illustrator of Service:  SNF (31) Provider:   Peggye Ley, ANP [facility] [phone]   Kermit Balo, DO  Patient Care Team: Kermit Balo, DO as PCP - General (Geriatric Medicine) Samson Frederic, MD as Consulting Physician (Orthopedic Surgery)  Extended Emergency Contact Information Primary Emergency Contact: Lambeth,Judith Address: [address]          [place], [place] [address] [place] Home Phone: [phone] Mobile Phone: [phone] Relation: Relative Secondary Emergency Contact: Curley Spice [place] Home Phone: [phone] Relation: Niece   Chief Complaint  Patient presents with  . Acute Visit    pain management    HPI:  Pt is a 84 y.o. male seen today for an acute visit for issues with pain during therapy. He arrived from [facility] memory care unit for long term management a couple of weeks ago. Since his arrival he has had issues with agitation and resistance to personal care. Prior to my visit he had received ativan and was asleep. He has advanced dementia and was treated at the hospital in Nov of 2018 for a all with subsequent left femoral neck fracture. He received a left hemiarthroplasty on 11/21.  The staff report that it is difficult to get him up in the morning. He appears to be in pain and will say "his joints hurt".  He told the PT that he was in pain and seemed to have an antalgic gait. Of note he had another fall in Oct of 2018 and was found to have mildly displaced fractures of the spinous processes of T2-3. Due to Mr. Schnetzer's dementia it is difficult to assess his pain as he is not able to verbalize location or rate his pain. The nurses feel that he experiences pain first thing in the morning. One week ago they asked for scheduled norco prior to getting out of bed as they had tried the prn dose and found it helpful. After 1 week of two tabs of Norco  he continues to experience pain during therapy but the staff point out that it is difficult to tell what is pain and what is agitation. He recently has been seen by geriatric psych with medication adjustments made to help with agitation (too soon to tell if this has helped).   Past Medical History:  Diagnosis Date  . Anemia   . Benign positional vertigo   . Bilateral carpal tunnel syndrome   . BPH (benign prostatic hyperplasia)   . Chest pain    noncardiac  . Chronic anal fissure   . Chronic low back pain   . Dementia   . Disease    perrone  . Diverticulosis   . DJD (degenerative joint disease)   . Elevated blood sugar   . GERD (gastroesophageal reflux disease)   . Gilbert's syndrome   . H/O thyroidectomy   . Heart murmur   . Hypercholesteremia   . Hypertension    labils  . Hyperthyroidism   . Iron deficiency   . Lichen planus   . Peripheral neuropathy   . Polymyalgia rheumatica (HCC)    Past Surgical History:  Procedure Laterality Date  . APPENDECTOMY    . bottom teeth removed with implants    . hemrrhoidectomy    . left and right cataract    . left lens implant    . THYROIDECTOMY, PARTIAL    . TONSILLECTOMY    . TOTAL HIP  ARTHROPLASTY Left 12/30/2016   Procedure: LEFT HIP HEMIARTHROPLASTY  ANTERIOR APPROACH;  Surgeon: Samson Frederic, MD;  Location: [facility];  Service: Orthopedics;  Laterality: Left;    No Known Allergies  Outpatient Encounter Medications as of 03/26/2017  Medication Sig  . mirtazapine (REMERON) 30 MG tablet Take 30 mg by mouth at bedtime.  . risperiDONE (RISPERDAL) 2 MG tablet Take 2 mg by mouth at bedtime.  Marland Kitchen aspirin 81 MG tablet Take 1 tablet (81 mg total) by mouth 2 (two) times daily with a meal. (Patient taking differently: Take 81 mg by mouth daily. )  . docusate sodium (COLACE) 100 MG capsule Take 200 mg by mouth 2 (two) times daily as needed for mild constipation.  Marland Kitchen HYDROcodone-acetaminophen (NORCO/VICODIN) 5-325 MG tablet Take 2 tablets  by mouth every morning. For 1 week  . levothyroxine (SYNTHROID, LEVOTHROID) 88 MCG tablet Take 88 mcg by mouth daily before breakfast.  . LORazepam (ATIVAN) 0.5 MG tablet Take 1 tablet (0.5 mg total) by mouth every 6 (six) hours as needed for anxiety.  . metoprolol succinate (TOPROL-XL) 25 MG 24 hr tablet Take 1 tablet (25 mg total) by mouth daily.  . Multiple Vitamins-Minerals (CERTAVITE SENIOR/ANTIOXIDANT) TABS Take 1 tablet by mouth daily.  Marland Kitchen senna-docusate (SENOKOT-S) 8.6-50 MG tablet Take 1 tablet by mouth at bedtime as needed for mild constipation.  . [DISCONTINUED] atorvastatin (LIPITOR) 10 MG tablet Take 10 mg by mouth daily.  . [DISCONTINUED] busPIRone (BUSPAR) 5 MG tablet Take 1 tablet (5 mg total) by mouth 2 (two) times daily.  . [DISCONTINUED] mirtazapine (REMERON) 15 MG tablet Take 1 tablet (15 mg total) by mouth at bedtime.  . [DISCONTINUED] risperiDONE (RISPERDAL) 0.5 MG tablet Take 1 tablet (0.5 mg total) by mouth at bedtime.  . [DISCONTINUED] sertraline (ZOLOFT) 25 MG tablet Take 1 tablet (25 mg total) by mouth daily.   No facility-administered encounter medications on file as of 03/26/2017.     Review of Systems  Unable to perform ROS: Dementia    Immunization History  Administered Date(s) Administered  . Influenza-Unspecified 12/11/2010, 11/18/2011, 11/25/2012, 11/15/2014, 12/19/2015  . Pneumococcal Conjugate-13 12/05/2014  . Pneumococcal Polysaccharide-23 08/02/2005  . Td 12/05/2014  . Tdap 08/02/2004  . Zoster 08/03/2006   Pertinent  Health Maintenance Due  Topic Date Due  . INFLUENZA VACCINE  09/10/2016  . PNA vac Low Risk Adult  Completed   No flowsheet data found. Functional Status Survey:    Vitals:   03/26/17 1436  BP: 117/61  Pulse: 69  Resp: 18  Temp: 98.1 F (36.7 C)  SpO2: 92%   There is no height or weight on file to calculate BMI. Physical Exam  Constitutional: No distress.  Frail and thin  HENT:  Head: Normocephalic.  Eyes:  Conjunctivae are normal. Pupils are equal, round, and reactive to light. Right eye exhibits no discharge. Left eye exhibits no discharge.  Cardiovascular: Normal rate and regular rhythm.  Murmur (systolic ) heard. No edema  Pulmonary/Chest: Effort normal and breath sounds normal.  Abdominal: Soft. Bowel sounds are normal. He exhibits no distension.  Musculoskeletal: He exhibits no edema, tenderness or deformity.  No pain with ROM of the left or right hip or either knee. Neg SLR. Not able to test strength. No spinous process tenderness.  Lymphadenopathy:    He has no cervical adenopathy.  Neurological:  Sleepy due to ativan. Not able to f/c or answer's  Skin: Skin is warm and dry. He is not diaphoretic.  Psychiatric: He has  a normal mood and affect.    Labs reviewed: Recent Labs    12/31/16 0602  01/06/17 0555 01/08/17 0603 01/09/17 0552 03/18/17 0400  NA 138   < > 144 140 137 139  K 3.8   < > 3.4* 3.7 3.8 4.5  CL 107   < > 110 107 106  --   CO2 27   < > 27 27 25   --   GLUCOSE 102*   < > 144* 96 94  --   BUN 32*   < > 23* 18 17 17   CREATININE 0.91   < > 0.79 0.84 0.78 0.7  CALCIUM 8.1*   < > 8.8* 8.7* 8.5*  --   MG 2.0  --   --   --   --   --    < > = values in this interval not displayed.   Recent Labs    04/01/16 0128 03/18/17 0400  AST 30 16  ALT 20 9*  ALKPHOS 56 117   Recent Labs    12/30/16 0611 12/30/16 0901 12/31/16 0602  01/02/17 0851 01/03/17 0624 01/05/17 0512 03/18/17 0400  WBC QUESTIONABLE RESULTS, RECOMMEND RECOLLECT TO VERIFY 13.8* 9.9   < > 6.7 6.4 5.4 5.8  NEUTROABS QUESTIONABLE RESULTS, RECOMMEND RECOLLECT TO VERIFY 12.5* 8.3*  --   --   --   --   --   HGB QUESTIONABLE RESULTS, RECOMMEND RECOLLECT TO VERIFY 10.6* 9.5*   < > 10.6* 10.3* 9.2* 10.0*  HCT QUESTIONABLE RESULTS, RECOMMEND RECOLLECT TO VERIFY 32.1* 28.9*   < > 32.0* 30.6* 27.5* 31*  MCV QUESTIONABLE RESULTS, RECOMMEND RECOLLECT TO VERIFY 96.7 98.0   < > 95.5 94.7 96.2  --   PLT  QUESTIONABLE RESULTS, RECOMMEND RECOLLECT TO VERIFY 227 195   < > 220 229 262 339   < > = values in this interval not displayed.   Lab Results  Component Value Date   TSH 18.25 (A) 03/18/2017   Lab Results  Component Value Date   HGBA1C 6.0 12/19/2015   Lab Results  Component Value Date   CHOL 238 (A) 04/01/2016   HDL 44 04/01/2016   LDLCALC 153 04/01/2016   TRIG 200 (A) 04/01/2016    Significant Diagnostic Results in last 30 days:  No results found.  Assessment/Plan   Closed left hip fracture (HCC) ? If this is the reason for some of his "pain" or agitation. I was not able to tease out during my visit whether he was in pain or there was agitation. He has appeared to be in pain per the staff and the therapy dept. Scheduled norco helped minimally. We will try the below plan and monitor for improvement. Given his advanced dementia and poor quality of life our goal is to keep him comfortable. He is not able to return to ortho due to his behaviors. Celebrex 200 mg qam with food x 2 weeks Change Norco to 1 tab in the am and 1 tab in the pm   Thoracic spine fracture (HCC) Salonpas patch to T2-3 area on in the am and off in the pm Monitor for improvement.     Family/ staff Communication: discussed with staff/therapy  Labs/tests ordered: NA

## 2017-04-08 ENCOUNTER — Encounter: Payer: Self-pay | Admitting: Internal Medicine

## 2017-04-08 NOTE — Progress Notes (Signed)
Cherylin MylarJudy Lamberth, pt's sister-in-law, has requested that we complete a letter indicating Mr. Schreur's need for long-term care in the memory care unit here.

## 2017-04-13 ENCOUNTER — Non-Acute Institutional Stay (SKILLED_NURSING_FACILITY): Payer: Medicare Other | Admitting: Adult Health

## 2017-04-13 ENCOUNTER — Encounter: Payer: Self-pay | Admitting: Adult Health

## 2017-04-13 DIAGNOSIS — F01518 Vascular dementia, unspecified severity, with other behavioral disturbance: Secondary | ICD-10-CM

## 2017-04-13 DIAGNOSIS — F0151 Vascular dementia with behavioral disturbance: Secondary | ICD-10-CM

## 2017-04-13 DIAGNOSIS — E039 Hypothyroidism, unspecified: Secondary | ICD-10-CM | POA: Diagnosis not present

## 2017-04-13 NOTE — Progress Notes (Signed)
[facility]   [facility] of Service:  S[facility] (31) Provider:   [name], ANP [facility] [phone]   Benjamin Banks, [name], DO  Patient Care Team: Benjamin Banks, [name], DO as PCP - General (Geriatric Medicine) Samson FredericSwinteck, Brian, MD as Consulting Physician (Orthopedic Surgery)  Extended Emergency Contact Information Primary Emergency Contact: Banks,Benjamin Address: [address]          [place], [place] [address] [place] Home Phone: [phone] Mobile Phone: [phone] Relation: Relative Secondary Emergency Contact: Curley SpiceBowman,Benjamin  [place] Home Phone: [phone] Relation: Niece   Chief Complaint  Patient presents with  . Acute Visit    agitation    HPI:  Pt is a 84 y.o. male seen today for an acute visit for agitation.  He has a hx of vascular dementia and moved to the memory care unit in February. He has had issues with hitting and kicking the staff and refusing personal care. He also play in BM and smears it on furniture and the floor. We have treated him for pain from possible joint issues with Norco 1 tab BID and this has not helped. Dr. Donell BeersPlovsky started him on Risperdal and Remeron, which also has not helped. Dr Renato Gailseed started him on trazodone and reduced the remeron one week ago due to agitation and difficulty sleeping. The nsg notes indicate that there are times where he does not sleep and other times that he does. He sleeps until 2pm most days and so his schedule is a bit different from the other residents. He is very hungry when he wakes up but can't sit still at times to finish a meal. The staff are concerned that he will fall as he can become combative when walking. He sustained a skin tear to his arm related to this issue. He continues to be aggressive towards staff.  VS are stable and no other signs/symptoms reported.   His TSH has been elevated and we have adjusted his synthroid. Lab pending in two weeks.    Lab Results  Component Value Date   TSH 18.25 (A) 03/18/2017    Past Medical History:  Diagnosis Date  . Anemia   . Benign positional vertigo   . Bilateral carpal tunnel syndrome   . BPH (benign prostatic hyperplasia)   . Chest pain    noncardiac  . Chronic anal fissure   . Chronic low back pain   . Dementia   . Disease    perrone  . Diverticulosis   . DJD (degenerative joint disease)   . Elevated blood sugar   . GERD (gastroesophageal reflux disease)   . Gilbert's syndrome   . H/O thyroidectomy   . Heart murmur   . Hypercholesteremia   . Hypertension    labils  . Hyperthyroidism   . Iron deficiency   . Lichen planus   . Peripheral neuropathy   . Polymyalgia rheumatica (HCC)    Past Surgical History:  Procedure Laterality Date  . APPENDECTOMY    . bottom teeth removed with implants    . hemrrhoidectomy    . left and right cataract    . left lens implant    . THYROIDECTOMY, PARTIAL    . TONSILLECTOMY    . TOTAL HIP ARTHROPLASTY Left 12/30/2016   Procedure: LEFT HIP HEMIARTHROPLASTY  ANTERIOR APPROACH;  Surgeon: Samson FredericSwinteck, Brian, MD;  Location: [facility];  Service: Orthopedics;  Laterality: Left;    No Known Allergies  Outpatient Encounter Medications  as of 04/13/2017  Medication Sig  . mirtazapine (REMERON) 15 MG tablet Take 15 mg by mouth at bedtime.  . traZODone (DESYREL) 50 MG tablet Take 25 mg by mouth at bedtime.  Marland Kitchen aspirin 81 MG tablet Take 1 tablet (81 mg total) by mouth 2 (two) times daily with a meal. (Patient taking differently: Take 81 mg by mouth daily. )  . docusate sodium (COLACE) 100 MG capsule Take 200 mg by mouth 2 (two) times daily as needed for mild constipation.  Marland Kitchen HYDROcodone-acetaminophen (NORCO/VICODIN) 5-325 MG tablet Take 1 tablet by mouth 2 (two) times daily.  Marland Kitchen levothyroxine (SYNTHROID, LEVOTHROID) 88 MCG tablet Take 88 mcg by mouth daily before breakfast.  . LORazepam (ATIVAN) 0.5 MG tablet Take 1 tablet (0.5 mg total) by mouth every 6  (six) hours as needed for anxiety.  . metoprolol succinate (TOPROL-XL) 25 MG 24 hr tablet Take 1 tablet (25 mg total) by mouth daily.  . Multiple Vitamins-Minerals (CERTAVITE SENIOR/ANTIOXIDANT) TABS Take 1 tablet by mouth daily.  . risperiDONE (RISPERDAL) 2 MG tablet Take 2 mg by mouth at bedtime.  . senna-docusate (SENOKOT-S) 8.6-50 MG tablet Take 1 tablet by mouth at bedtime as needed for mild constipation.  . [DISCONTINUED] mirtazapine (REMERON) 30 MG tablet Take 30 mg by mouth at bedtime.   No facility-administered encounter medications on file as of 04/13/2017.     Review of Systems  Unable to perform ROS: Dementia    Immunization History  Administered Date(s) Administered  . Influenza-Unspecified 12/11/2010, 11/18/2011, 11/25/2012, 11/15/2014, 12/19/2015  . Pneumococcal Conjugate-13 12/05/2014  . Pneumococcal Polysaccharide-23 08/02/2005  . Td 12/05/2014  . Tdap 08/02/2004  . Zoster 08/03/2006   Pertinent  Health Maintenance Due  Topic Date Due  . INFLUENZA VACCINE  09/10/2016  . PNA vac Low Risk Adult  Completed   No flowsheet data found. Functional Status Survey:    Vitals:   04/13/17 1644  BP: 137/65  Pulse: 67  Resp: 20  Temp: 98 F (36.7 C)  SpO2: 94%   There is no height or weight on file to calculate BMI. Physical Exam  Constitutional: No distress.  Thin and frail  Neurological: He is alert.  Combative and hits and kicks during my exam. I was not able to complete my exam.   Skin: He is not diaphoretic.    Labs reviewed: Recent Labs    12/31/16 0602  01/06/17 0555 01/08/17 0603 01/09/17 0552 03/18/17 0400  NA 138   < > 144 140 137 139  K 3.8   < > 3.4* 3.7 3.8 4.5  CL 107   < > 110 107 106  --   CO2 27   < > 27 27 25   --   GLUCOSE 102*   < > 144* 96 94  --   BUN 32*   < > 23* 18 17 17   CREATININE 0.91   < > 0.79 0.84 0.78 0.7  CALCIUM 8.1*   < > 8.8* 8.7* 8.5*  --   MG 2.0  --   --   --   --   --    < > = values in this interval not  displayed.   Recent Labs    03/18/17 0400  AST 16  ALT 9*  ALKPHOS 117   Recent Labs    12/30/16 0611 12/30/16 0901 12/31/16 0602  01/02/17 0851 01/03/17 0624 01/05/17 0512 03/18/17 0400  WBC QUESTIONABLE RESULTS, RECOMMEND RECOLLECT TO VERIFY 13.8* 9.9   < >  6.7 6.4 5.4 5.8  NEUTROABS QUESTIONABLE RESULTS, RECOMMEND RECOLLECT TO VERIFY 12.5* 8.3*  --   --   --   --   --   HGB QUESTIONABLE RESULTS, RECOMMEND RECOLLECT TO VERIFY 10.6* 9.5*   < > 10.6* 10.3* 9.2* 10.0*  HCT QUESTIONABLE RESULTS, RECOMMEND RECOLLECT TO VERIFY 32.1* 28.9*   < > 32.0* 30.6* 27.5* 31*  MCV QUESTIONABLE RESULTS, RECOMMEND RECOLLECT TO VERIFY 96.7 98.0   < > 95.5 94.7 96.2  --   PLT QUESTIONABLE RESULTS, RECOMMEND RECOLLECT TO VERIFY 227 195   < > 220 229 262 339   < > = values in this interval not displayed.   Lab Results  Component Value Date   TSH 18.25 (A) 03/18/2017   Lab Results  Component Value Date   HGBA1C 6.0 12/19/2015   Lab Results  Component Value Date   CHOL 238 (A) 04/01/2016   HDL 44 04/01/2016   LDLCALC 153 04/01/2016   TRIG 200 (A) 04/01/2016    Significant Diagnostic Results in last 30 days:  No results found.  Assessment/Plan  1. Vascular dementia with behavior disturbance He may benefit from Depakote but I will wait on Dr Donell Beers to see him later this week if possible to avoid too may providers adjusting meds at one time. Ativan is already ordered prn but not used consistently. I will schedule Ativan 0.5 mg qam and continue prn dosing, may use gel if not able to swallow. Hopefully he will improve as his TSH comes down.   2. Acquired hypothyroidism Continue Synthroid 88 mcg qd TSH due in two weeks  Peggye Ley, ANP [facility] [phone]

## 2017-04-22 ENCOUNTER — Encounter: Payer: Self-pay | Admitting: Internal Medicine

## 2017-04-22 LAB — TSH: TSH: 2.71 (ref 0.41–5.90)

## 2017-04-28 ENCOUNTER — Encounter: Payer: Self-pay | Admitting: Internal Medicine

## 2017-04-28 ENCOUNTER — Non-Acute Institutional Stay (SKILLED_NURSING_FACILITY): Payer: Medicare Other | Admitting: Internal Medicine

## 2017-04-28 ENCOUNTER — Non-Acute Institutional Stay (SKILLED_NURSING_FACILITY): Payer: Medicare Other

## 2017-04-28 DIAGNOSIS — F01518 Vascular dementia, unspecified severity, with other behavioral disturbance: Secondary | ICD-10-CM

## 2017-04-28 DIAGNOSIS — R627 Adult failure to thrive: Secondary | ICD-10-CM | POA: Diagnosis not present

## 2017-04-28 DIAGNOSIS — Z0000 Encounter for general adult medical examination without abnormal findings: Secondary | ICD-10-CM

## 2017-04-28 DIAGNOSIS — R634 Abnormal weight loss: Secondary | ICD-10-CM

## 2017-04-28 DIAGNOSIS — F0151 Vascular dementia with behavioral disturbance: Secondary | ICD-10-CM

## 2017-04-28 DIAGNOSIS — E039 Hypothyroidism, unspecified: Secondary | ICD-10-CM | POA: Diagnosis not present

## 2017-04-28 NOTE — Patient Instructions (Signed)
[name] , Thank you for taking time to come for your Medicare Wellness Visit. I appreciate your ongoing commitment to your health goals. Please review the following plan we discussed and let me know if I can assist you in the future.   Screening recommendations/referrals: Colonoscopy excluded, pt is over age 82 Recommended yearly ophthalmology/optometry visit for glaucoma screening and checkup Recommended yearly dental visit for hygiene and checkup  Vaccinations: Influenza vaccine up to date, due 2019 fall season Pneumococcal vaccine up to date, complete Tdap vaccine up to date, due 12/04/2024 Shingles vaccine due, shingrix not in past records    Advanced directives: Need a copy for living will and health care power of attorney  Conditions/risks identified: none  Next appointment: Dr. Renato Gailseed makes rounds  Preventive Care 65 Years and Older, Male Preventive care refers to lifestyle choices and visits with your health care provider that can promote health and wellness. What does preventive care include?  A yearly physical exam. This is also called an annual well check.  Dental exams once or twice a year.  Routine eye exams. Ask your health care provider how often you should have your eyes checked.  Personal lifestyle choices, including:  Daily care of your teeth and gums.  Regular physical activity.  Eating a healthy diet.  Avoiding tobacco and drug use.  Limiting alcohol use.  Practicing safe sex.  Taking low doses of aspirin every day.  Taking vitamin and mineral supplements as recommended by your health care provider. What happens during an annual well check? The services and screenings done by your health care provider during your annual well check will depend on your age, overall health, lifestyle risk factors, and family history of disease. Counseling  Your health care provider may ask you questions about your:  Alcohol use.  Tobacco use.  Drug  use.  Emotional well-being.  Home and relationship well-being.  Sexual activity.  Eating habits.  History of falls.  Memory and ability to understand (cognition).  Work and work Astronomerenvironment. Screening  You may have the following tests or measurements:  Height, weight, and BMI.  Blood pressure.  Lipid and cholesterol levels. These may be checked every 5 years, or more frequently if you are over 82 years old.  Skin check.  Lung cancer screening. You may have this screening every year starting at age 82 if you have a 30-pack-year history of smoking and currently smoke or have quit within the past 15 years.  Fecal occult blood test (FOBT) of the stool. You may have this test every year starting at age 82.  Flexible sigmoidoscopy or colonoscopy. You may have a sigmoidoscopy every 5 years or a colonoscopy every 10 years starting at age 82.  Prostate cancer screening. Recommendations will vary depending on your family history and other risks.  Hepatitis C blood test.  Hepatitis B blood test.  Sexually transmitted disease (STD) testing.  Diabetes screening. This is done by checking your blood sugar (glucose) after you have not eaten for a while (fasting). You may have this done every 1-3 years.  Abdominal aortic aneurysm (AAA) screening. You may need this if you are a current or former smoker.  Osteoporosis. You may be screened starting at age 870 if you are at high risk. Talk with your health care provider about your test results, treatment options, and if necessary, the need for more tests. Vaccines  Your health care provider may recommend certain vaccines, such as:  Influenza vaccine. This is recommended every  year.  Tetanus, diphtheria, and acellular pertussis (Tdap, Td) vaccine. You may need a Td booster every 10 years.  Zoster vaccine. You may need this after age 61.  Pneumococcal 13-valent conjugate (PCV13) vaccine. One dose is recommended after age  11.  Pneumococcal polysaccharide (PPSV23) vaccine. One dose is recommended after age 51. Talk to your health care provider about which screenings and vaccines you need and how often you need them. This information is not intended to replace advice given to you by your health care provider. Make sure you discuss any questions you have with your health care provider. Document Released: 02/23/2015 Document Revised: 10/17/2015 Document Reviewed: 11/28/2014 Elsevier Interactive Patient Education  2017 [place] Prevention in the Home Falls can cause injuries. They can happen to people of all ages. There are many things you can do to make your home safe and to help prevent falls. What can I do on the outside of my home?  Regularly fix the edges of walkways and driveways and fix any cracks.  Remove anything that might make you trip as you walk through a door, such as a raised step or threshold.  Trim any bushes or trees on the path to your home.  Use bright outdoor lighting.  Clear any walking paths of anything that might make someone trip, such as rocks or tools.  Regularly check to see if handrails are loose or broken. Make sure that both sides of any steps have handrails.  Any raised decks and porches should have guardrails on the edges.  Have any leaves, snow, or ice cleared regularly.  Use sand or salt on walking paths during winter.  Clean up any spills in your garage right away. This includes oil or grease spills. What can I do in the bathroom?  Use night lights.  Install grab bars by the toilet and in the tub and shower. Do not use towel bars as grab bars.  Use non-skid mats or decals in the tub or shower.  If you need to sit down in the shower, use a plastic, non-slip stool.  Keep the floor dry. Clean up any water that spills on the floor as soon as it happens.  Remove soap buildup in the tub or shower regularly.  Attach bath mats securely with double-sided  non-slip rug tape.  Do not have throw rugs and other things on the floor that can make you trip. What can I do in the bedroom?  Use night lights.  Make sure that you have a light by your bed that is easy to reach.  Do not use any sheets or blankets that are too big for your bed. They should not hang down onto the floor.  Have a firm chair that has side arms. You can use this for support while you get dressed.  Do not have throw rugs and other things on the floor that can make you trip. What can I do in the kitchen?  Clean up any spills right away.  Avoid walking on wet floors.  Keep items that you use a lot in easy-to-reach places.  If you need to reach something above you, use a strong step stool that has a grab bar.  Keep electrical cords out of the way.  Do not use floor polish or wax that makes floors slippery. If you must use wax, use non-skid floor wax.  Do not have throw rugs and other things on the floor that can make you trip. What can  I do with my stairs?  Do not leave any items on the stairs.  Make sure that there are handrails on both sides of the stairs and use them. Fix handrails that are broken or loose. Make sure that handrails are as long as the stairways.  Check any carpeting to make sure that it is firmly attached to the stairs. Fix any carpet that is loose or worn.  Avoid having throw rugs at the top or bottom of the stairs. If you do have throw rugs, attach them to the floor with carpet tape.  Make sure that you have a light switch at the top of the stairs and the bottom of the stairs. If you do not have them, ask someone to add them for you. What else can I do to help prevent falls?  Wear shoes that:  Do not have high heels.  Have rubber bottoms.  Are comfortable and fit you well.  Are closed at the toe. Do not wear sandals.  If you use a stepladder:  Make sure that it is fully opened. Do not climb a closed stepladder.  Make sure that both  sides of the stepladder are locked into place.  Ask someone to hold it for you, if possible.  Clearly mark and make sure that you can see:  Any grab bars or handrails.  First and last steps.  Where the edge of each step is.  Use tools that help you move around (mobility aids) if they are needed. These include:  Canes.  Walkers.  Scooters.  Crutches.  Turn on the lights when you go into a dark area. Replace any light bulbs as soon as they burn out.  Set up your furniture so you have a clear path. Avoid moving your furniture around.  If any of your floors are uneven, fix them.  If there are any pets around you, be aware of where they are.  Review your medicines with your doctor. Some medicines can make you feel dizzy. This can increase your chance of falling. Ask your doctor what other things that you can do to help prevent falls. This information is not intended to replace advice given to you by your health care provider. Make sure you discuss any questions you have with your health care provider. Document Released: 11/23/2008 Document Revised: 07/05/2015 Document Reviewed: 03/03/2014 Elsevier Interactive Patient Education  2017 [employer].

## 2017-04-28 NOTE — Progress Notes (Signed)
Patient ID: Benjamin Banks, male   DOB: 10-08-1925, 84 y.o.   MRN: [id number]  Location:  [facility] Room Number: [phone] memory care Place of Service:  SNF [phone]) Provider:  Rosine Door, RN, AGPCNP, DNP Student/ Kermit Balo, DO  Patient Care Team: Kermit Balo, DO as PCP - General (Geriatric Medicine) Samson Frederic, MD as Consulting Physician (Orthopedic Surgery)  Extended Emergency Contact Information Primary Emergency Contact: Lambeth,Judith Address: [address]          [place], [place] [address] [place] Home Phone: [phone] Mobile Phone: [phone] Relation: Relative Secondary Emergency Contact: Curley Spice [place] of [place] Home Phone: [phone] Relation: Niece  Code Status: DNR Goals of care: Advanced Directive information Advanced Directives 04/28/2017  Does Patient Have a Medical Advance Directive? Yes  Type of Estate agent of [place];Out of facility DNR (pink MOST or yellow form)  Does patient want to make changes to medical advance directive? No - Patient declined  Copy of Healthcare Power of Attorney in Chart? Yes  Would patient like information on creating a medical advance directive? No - Patient declined  Pre-existing out of facility DNR order (yellow form or pink MOST form) Yellow form placed in chart (order not valid for inpatient use)     Chief Complaint  Patient presents with  . Medical Management of Chronic Issues    Routine visit    HPI:  Pt is a 84 y.o. male seen today for medical management of chronic diseases. All information is given per nursing due to the advanced stage of his dementia. He is sleeping a lot and appears to have his sleep schedule off as he is more alert on second shift. He goes through periods of being combative toward staff, and refuses personal care. He is incontinent at times. He will get himself up instead of calling for help. He  has a walker and a pad at the bedside for safety he does not always use it. He takes his pills crushed.      Past Medical History:  Diagnosis Date  . Anemia   . Benign positional vertigo   . Bilateral carpal tunnel syndrome   . BPH (benign prostatic hyperplasia)   . Chest pain    noncardiac  . Chronic anal fissure   . Chronic low back pain   . Dementia   . Disease    perrone  . Diverticulosis   . DJD (degenerative joint disease)   . Elevated blood sugar   . GERD (gastroesophageal reflux disease)   . Gilbert's syndrome   . H/O thyroidectomy   . Heart murmur   . Hypercholesteremia   . Hypertension    labils  . Hyperthyroidism   . Iron deficiency   . Lichen planus   . Peripheral neuropathy   . Polymyalgia rheumatica (HCC)    Past Surgical History:  Procedure Laterality Date  . APPENDECTOMY    . bottom teeth removed with implants    . hemrrhoidectomy    . left and right cataract    . left lens implant    . THYROIDECTOMY, PARTIAL    . TONSILLECTOMY    . TOTAL HIP ARTHROPLASTY Left 12/30/2016   Procedure: LEFT HIP HEMIARTHROPLASTY  ANTERIOR APPROACH;  Surgeon: Samson Frederic, MD;  Location: [facility];  Service: Orthopedics;  Laterality: Left;    No Known Allergies  Outpatient Encounter Medications as of 04/28/2017  Medication Sig  . aspirin EC 81  MG tablet Take 81 mg by mouth daily.  . divalproex (DEPAKOTE ER) 250 MG 24 hr tablet Take 500 mg by mouth daily.  Marland Kitchen. docusate sodium (COLACE) 100 MG capsule Take 200 mg by mouth 2 (two) times daily as needed for mild constipation.  Marland Kitchen. HYDROcodone-acetaminophen (NORCO/VICODIN) 5-325 MG tablet Take 1 tablet by mouth every morning. Give 2 tablets by mouth every 6 hours as needed for severe pain  . levothyroxine (SYNTHROID, LEVOTHROID) 88 MCG tablet Take 88 mcg by mouth daily before breakfast.  . LORazepam (ATIVAN) 0.5 MG tablet Take 0.5 mg by mouth 2 (two) times daily. Give 1 tablet by mouth as needed for anxiety  . metoprolol  succinate (TOPROL-XL) 25 MG 24 hr tablet Take 1 tablet (25 mg total) by mouth daily.  . mirtazapine (REMERON) 15 MG tablet Take 15 mg by mouth at bedtime.  . Multiple Vitamins-Minerals (CERTAVITE SENIOR/ANTIOXIDANT) TABS Take 1 tablet by mouth daily.  . risperiDONE (RISPERDAL) 2 MG tablet Take 2 mg by mouth at bedtime.  . senna-docusate (SENOKOT-S) 8.6-50 MG tablet Take 1 tablet by mouth at bedtime as needed for mild constipation.  . [DISCONTINUED] HYDROcodone-acetaminophen (NORCO/VICODIN) 5-325 MG tablet Take 1 tablet by mouth 2 (two) times daily.  . [DISCONTINUED] LORazepam (ATIVAN) 0.5 MG tablet Take 1 tablet (0.5 mg total) by mouth every 6 (six) hours as needed for anxiety.  . [DISCONTINUED] aspirin 81 MG tablet Take 1 tablet (81 mg total) by mouth 2 (two) times daily with a meal. (Patient taking differently: Take 81 mg by mouth daily. )  . [DISCONTINUED] traZODone (DESYREL) 50 MG tablet Take 25 mg by mouth at bedtime.   No facility-administered encounter medications on file as of 04/28/2017.     Review of Systems  Unable to perform ROS: Dementia (performed with nursing )  Constitutional: Positive for activity change and appetite change.  Respiratory: Negative.   Cardiovascular: Negative.   Gastrointestinal:       Incontinent at times   Genitourinary:       Incontinent at times  Musculoskeletal: Positive for gait problem.       Fall risk  Skin: Negative.   Neurological: Negative for tremors and syncope.  Psychiatric/Behavioral: Positive for agitation, behavioral problems, confusion and sleep disturbance.    Immunization History  Administered Date(s) Administered  . Influenza-Unspecified 12/11/2010, 11/18/2011, 11/25/2012, 11/15/2014, 12/19/2015, 11/26/2016  . Pneumococcal Conjugate-13 12/05/2014  . Pneumococcal Polysaccharide-23 08/02/2005  . Td 12/05/2014  . Tdap 08/02/2004  . Zoster 08/03/2006   Pertinent  Health Maintenance Due  Topic Date Due  . INFLUENZA VACCINE   Completed  . PNA vac Low Risk Adult  Completed   Fall Risk  04/28/2017  Falls in the past year? Yes  Number falls in past yr: 2 or more  Injury with Fall? Yes   Functional Status Survey:    Vitals:   04/28/17 1342  BP: (!) 108/54  Pulse: 60  Resp: 17  Temp: (!) 97.4 F (36.3 C)  TempSrc: Oral  SpO2: 91%  Weight: 121 lb (54.9 kg)   Body mass index is 18.95 kg/m. Physical Exam  Constitutional: He appears cachectic. He is easily aroused.  Thin frail, no acute distress  Cardiovascular: Normal rate, regular rhythm, normal heart sounds and intact distal pulses.  Pulmonary/Chest: Effort normal and breath sounds normal.  Abdominal: Soft. Bowel sounds are normal.  Musculoskeletal: Normal range of motion.  Neurological: He is easily aroused.  Skin: Skin is warm and dry.  Psychiatric:  He was not  combative with me. He was non-verbal.  Vitals reviewed.   Labs reviewed: Recent Labs    12/31/16 0602  01/06/17 0555 01/08/17 0603 01/09/17 0552 03/18/17 0400  NA 138   < > 144 140 137 139  K 3.8   < > 3.4* 3.7 3.8 4.5  CL 107   < > 110 107 106  --   CO2 27   < > 27 27 25   --   GLUCOSE 102*   < > 144* 96 94  --   BUN 32*   < > 23* 18 17 17   CREATININE 0.91   < > 0.79 0.84 0.78 0.7  CALCIUM 8.1*   < > 8.8* 8.7* 8.5*  --   MG 2.0  --   --   --   --   --    < > = values in this interval not displayed.   Recent Labs    03/18/17 0400  AST 16  ALT 9*  ALKPHOS 117   Recent Labs    12/30/16 0611 12/30/16 0901 12/31/16 0602  01/02/17 0851 01/03/17 0624 01/05/17 0512 03/18/17 0400  WBC QUESTIONABLE RESULTS, RECOMMEND RECOLLECT TO VERIFY 13.8* 9.9   < > 6.7 6.4 5.4 5.8  NEUTROABS QUESTIONABLE RESULTS, RECOMMEND RECOLLECT TO VERIFY 12.5* 8.3*  --   --   --   --   --   HGB QUESTIONABLE RESULTS, RECOMMEND RECOLLECT TO VERIFY 10.6* 9.5*   < > 10.6* 10.3* 9.2* 10.0*  HCT QUESTIONABLE RESULTS, RECOMMEND RECOLLECT TO VERIFY 32.1* 28.9*   < > 32.0* 30.6* 27.5* 31*  MCV  QUESTIONABLE RESULTS, RECOMMEND RECOLLECT TO VERIFY 96.7 98.0   < > 95.5 94.7 96.2  --   PLT QUESTIONABLE RESULTS, RECOMMEND RECOLLECT TO VERIFY 227 195   < > 220 229 262 339   < > = values in this interval not displayed.   Lab Results  Component Value Date   TSH 18.25 (A) 03/18/2017   Lab Results  Component Value Date   HGBA1C 6.0 12/19/2015   Lab Results  Component Value Date   CHOL 238 (A) 04/01/2016   HDL 44 04/01/2016   LDLCALC 153 04/01/2016   TRIG 200 (A) 04/01/2016    Significant Diagnostic Results in last 30 days:  No results found.  Assessment/Plan  1. Vascular dementia with behavior disturbance Continues to have advanced progression of dementia. He has significant levels of combativeness. He was recently started on Depakote, unsure if this is helping. He continues to be on Ativan, remeron, Risperdal. Will continue these and appreciate the collaboration with geriatric psychiatry.   2. Acquired hypothyroidism His last level was stable in range. Will continue his current dose and reassess at future date.  3. Failure to thrive in adult He has had some weight loss over the last month, according to the charts it appears to be between 5-10 pounds. Will need to monitor this more closely.  He is not eating as well as he could and is missing breakfast as he is usually asleep. On remeron, please encourage what he desires to eat as well as nutritional supplements.    4. Weight loss He has had a significant change in weight. Will monitor this more closely as he allows for weighing.     Family/ staff Communication: spoke with nursing staff   Labs/tests ordered:  none

## 2017-04-28 NOTE — Progress Notes (Signed)
Subjective:   Benjamin Banks is a 84 y.o. male who presents for Medicare Annual/Subsequent preventive examination at [facility]; incapacitated patient unable to answer questions appropriately   Last AWV-12/05/2014    Objective:    Vitals: BP 120/78 (BP Location: Right Arm, Patient Position: Supine)   Pulse 65   Temp 98.4 F (36.9 C) (Oral)   Ht 5\' 7"  (1.702 m)   Wt 132 lb (59.9 kg)   SpO2 97%   BMI 20.67 kg/m   Body mass index is 20.67 kg/m.  Advanced Directives 04/28/2017 12/30/2016 11/23/2016 07/06/2016 04/29/2016 04/28/2016  Does Patient Have a Medical Advance Directive? No Yes No No No;Yes No  Type of Advance Directive - Midwife - - Healthcare Power of Attorney -  Does patient want to make changes to medical advance directive? - No - Patient declined - - - -  Copy of Healthcare Power of Attorney in Chart? - No - copy requested - - - -  Would patient like information on creating a medical advance directive? No - Patient declined - - - No - Patient declined -    Tobacco Social History   Tobacco Use  Smoking Status Former Smoker  Smokeless Tobacco Never Used     Counseling given: Not Answered   Clinical Intake:  Pre-visit preparation completed: No  Pain : Faces Faces Pain Scale: No hurt  Faces Pain Scale: No hurt  Nutritional Risks: None Diabetes: No     Interpreter Needed?: No  Information entered by :: Tyron Russell, RN  Past Medical History:  Diagnosis Date  . Anemia   . Benign positional vertigo   . Bilateral carpal tunnel syndrome   . BPH (benign prostatic hyperplasia)   . Chest pain    noncardiac  . Chronic anal fissure   . Chronic low back pain   . Dementia   . Disease    perrone  . Diverticulosis   . DJD (degenerative joint disease)   . Elevated blood sugar   . GERD (gastroesophageal reflux disease)   . Gilbert's syndrome   . H/O thyroidectomy   . Heart murmur   . Hypercholesteremia   .  Hypertension    labils  . Hyperthyroidism   . Iron deficiency   . Lichen planus   . Peripheral neuropathy   . Polymyalgia rheumatica (HCC)    Past Surgical History:  Procedure Laterality Date  . APPENDECTOMY    . bottom teeth removed with implants    . hemrrhoidectomy    . left and right cataract    . left lens implant    . THYROIDECTOMY, PARTIAL    . TONSILLECTOMY    . TOTAL HIP ARTHROPLASTY Left 12/30/2016   Procedure: LEFT HIP HEMIARTHROPLASTY  ANTERIOR APPROACH;  Surgeon: Samson Frederic, MD;  Location: [facility];  Service: Orthopedics;  Laterality: Left;   Family History  Problem Relation Age of Onset  . COPD Mother   . COPD Father   . Alcohol abuse Father    Social History   Socioeconomic History  . Marital status: Unknown    Spouse name: None  . Number of children: None  . Years of education: None  . Highest education level: None  Social Needs  . Financial resource strain: None  . Food insecurity - worry: None  . Food insecurity - inability: None  . Transportation needs - medical: None  . Transportation needs - non-medical: None  Occupational History  . None  Tobacco Use  . Smoking status: Former Games developermoker  . Smokeless tobacco: Never Used  Substance and Sexual Activity  . Alcohol use: Yes    Comment: RARE  . Drug use: No  . Sexual activity: None  Other Topics Concern  . None  Social History Narrative  . None    Outpatient Encounter Medications as of 04/28/2017  Medication Sig  . aspirin 81 MG tablet Take 1 tablet (81 mg total) by mouth 2 (two) times daily with a meal. (Patient taking differently: Take 81 mg by mouth daily. )  . docusate sodium (COLACE) 100 MG capsule Take 200 mg by mouth 2 (two) times daily as needed for mild constipation.  Marland Kitchen. HYDROcodone-acetaminophen (NORCO/VICODIN) 5-325 MG tablet Take 1 tablet by mouth 2 (two) times daily.  Marland Kitchen. levothyroxine (SYNTHROID, LEVOTHROID) 88 MCG tablet Take 88 mcg by mouth daily before breakfast.  . LORazepam  (ATIVAN) 0.5 MG tablet Take 1 tablet (0.5 mg total) by mouth every 6 (six) hours as needed for anxiety.  . metoprolol succinate (TOPROL-XL) 25 MG 24 hr tablet Take 1 tablet (25 mg total) by mouth daily.  . mirtazapine (REMERON) 15 MG tablet Take 15 mg by mouth at bedtime.  . Multiple Vitamins-Minerals (CERTAVITE SENIOR/ANTIOXIDANT) TABS Take 1 tablet by mouth daily.  . risperiDONE (RISPERDAL) 2 MG tablet Take 2 mg by mouth at bedtime.  . senna-docusate (SENOKOT-S) 8.6-50 MG tablet Take 1 tablet by mouth at bedtime as needed for mild constipation.  . traZODone (DESYREL) 50 MG tablet Take 25 mg by mouth at bedtime.   No facility-administered encounter medications on file as of 04/28/2017.     Activities of Daily Living In your present state of health, do you have any difficulty performing the following activities: 04/28/2017 12/30/2016  Hearing? (No Data) N  Comment unknown -  Vision? (No Data) N  Comment unknown -  Difficulty concentrating or making decisions? Malvin JohnsY Y  Walking or climbing stairs? Y Y  Dressing or bathing? Y Y  Doing errands, shopping? Y N  Comment - SNF will run errands and get meds for the pt.  Preparing Food and eating ? Y -  Using the Toilet? Y -  In the past six months, have you accidently leaked urine? Y -  Do you have problems with loss of bowel control? Y -  Managing your Medications? Y -  Managing your Finances? Y -  Housekeeping or managing your Housekeeping? Y -  Some recent data might be hidden    Patient Care Team: [name], [name], DO as PCP - General (Geriatric Medicine) Samson FredericSwinteck, Brian, MD as Consulting Physician (Orthopedic Surgery)   Assessment:   This is a routine wellness examination for Benjamin Banks.  Exercise Activities and Dietary recommendations Current Exercise Habits: The patient does not participate in regular exercise at present, Exercise limited by: neurologic condition(s)  Goals    None      Fall Risk Fall Risk  04/28/2017  Falls in the  past year? Yes  Number falls in past yr: 2 or more  Injury with Fall? Yes   Is the patient's home free of loose throw rugs in walkways, pet beds, electrical cords, etc?   yes      Grab bars in the bathroom? yes      Handrails on the stairs?   yes      Adequate lighting?   yes  Timed Get Up and Go Performed: unable to perform  Depression Screen PHQ 2/9 Scores 04/28/2017  Exception  Documentation Medical reason    Cognitive Function MMSE - Mini Mental State Exam 04/28/2017  Not completed: Unable to complete        Immunization History  Administered Date(s) Administered  . Influenza-Unspecified 12/11/2010, 11/18/2011, 11/25/2012, 11/15/2014, 12/19/2015, 11/26/2016  . Pneumococcal Conjugate-13 12/05/2014  . Pneumococcal Polysaccharide-23 08/02/2005  . Td 12/05/2014  . Tdap 08/02/2004  . Zoster 08/03/2006    Qualifies for Shingles Vaccine? Records only show past zoster vaccine  Screening Tests Health Maintenance  Topic Date Due  . TETANUS/TDAP  12/04/2024  . INFLUENZA VACCINE  Completed  . PNA vac Low Risk Adult  Completed   Cancer Screenings: Lung: Low Dose CT Chest recommended if Age 43-80 years, 30 pack-year currently smoking OR have quit w/in 15years. Patient does not qualify. Colorectal: up to date  Additional Screenings: Hepatitis C Screening: declined    Plan:    I have personally reviewed and addressed the Medicare Annual Wellness questionnaire and have noted the following in the patient's chart:  A. Medical and social history B. Use of alcohol, tobacco or illicit drugs  C. Current medications and supplements D. Functional ability and status E.  Nutritional status F.  Physical activity G. Advance directives H. List of other physicians I.  Hospitalizations, surgeries, and ER visits in previous 12 months J.  Vitals K. Screenings to include hearing, vision, cognitive, depression L. Referrals and appointments - none  In addition, I am unable to review and  discuss with incapacitated patient certain preventive protocols, quality metrics, and best practice recommendations. A written personalized care plan for preventive services as well as general preventive health recommendations were provided to patient.   See attached scanned questionnaire for additional information.   Signed,   Tyron Russell, RN Nurse Health Advisor  Patient concerns: none

## 2017-05-15 ENCOUNTER — Encounter: Payer: Self-pay | Admitting: Adult Health

## 2017-05-15 ENCOUNTER — Non-Acute Institutional Stay (SKILLED_NURSING_FACILITY): Payer: Medicare Other | Admitting: Adult Health

## 2017-05-15 DIAGNOSIS — Z7189 Other specified counseling: Secondary | ICD-10-CM | POA: Diagnosis not present

## 2017-05-15 DIAGNOSIS — R634 Abnormal weight loss: Secondary | ICD-10-CM | POA: Diagnosis not present

## 2017-05-15 NOTE — ACP (Advance Care Planning) (Signed)
I called and spoke with Ms. Morrison OldLambeth[facility]. I let her know that he has shown signs of decline such as weight loss of 16 lbs in the past 3 months, weakness, sleeping more, and agitation related to dementia. He has been followed by geriatric psych with little benefit with medication changes. He appears to be nearing the end of life but is not acutely ill. He appears dehydrated on exam as well. We discussed his overall poor prognosis and poor quality of life. She agreed to an order for no hospitalizations and no feeding tubes. She will be in to visit next week and will review the most form and sign it. I spent 18 minutes in conversation with the POA.

## 2017-05-15 NOTE — Progress Notes (Addendum)
Location:  Medical illustrator of Service:  SNF (31) Provider:   Peggye Ley, ANP [facility] [phone]   Kermit Balo, DO  Patient Care Team: Kermit Balo, DO as PCP - General (Geriatric Medicine) Samson Frederic, MD as Consulting Physician (Orthopedic Surgery)  Extended Emergency Contact Information Primary Emergency Contact: Lambeth,Judith Address: [address]          [place], [place] [address] [place] Home Phone: [phone] Mobile Phone: [phone] Relation: Relative Secondary Emergency Contact: Curley Spice [place] of [place] Home Phone: [phone] Relation: Niece  Code Status:  DNR Goals of care: Advanced Directive information Advanced Directives 04/28/2017  Does Patient Have a Medical Advance Directive? Yes  Type of Estate agent of [place];Out of facility DNR (pink MOST or yellow form)  Does patient want to make changes to medical advance directive? No - Patient declined  Copy of Healthcare Power of Attorney in Chart? Yes  Would patient like information on creating a medical advance directive? No - Patient declined  Pre-existing out of facility DNR order (yellow form or pink MOST form) Yellow form placed in chart (order not valid for inpatient use)     Chief Complaint  Patient presents with  . Acute Visit    weight loss     HPI:  Pt is a 84 y.o. male seen today for an acute visit for weight loss. He has lost 16 lbs in the past 3 months and appears very frail and thin. The staff has encouraged intake and supplements but due to his dementia he refuses at times. He resides in the memory care unit and spends much of the day in bed throwing his legs off the bed, playing in bowel movement, and appears restless. He is followed by psych and they have continued to make adjustments to his medications with little benefit. He can become combative if this staff tries to get him out of  bed, clean him, or encourage him to participate in activities. He is taking synthroid regularly for hypothyroidism and TSH has stabilized.  Lab Results  Component Value Date   TSH 2.71 04/22/2017      Past Medical History:  Diagnosis Date  . Anemia   . Benign positional vertigo   . Bilateral carpal tunnel syndrome   . BPH (benign prostatic hyperplasia)   . Chest pain    noncardiac  . Chronic anal fissure   . Chronic low back pain   . Dementia   . Disease    perrone  . Diverticulosis   . DJD (degenerative joint disease)   . Elevated blood sugar   . GERD (gastroesophageal reflux disease)   . Gilbert's syndrome   . H/O thyroidectomy   . Heart murmur   . Hypercholesteremia   . Hypertension    labils  . Hyperthyroidism   . Iron deficiency   . Lichen planus   . Peripheral neuropathy   . Polymyalgia rheumatica (HCC)    Past Surgical History:  Procedure Laterality Date  . APPENDECTOMY    . bottom teeth removed with implants    . hemrrhoidectomy    . left and right cataract    . left lens implant    . THYROIDECTOMY, PARTIAL    . TONSILLECTOMY    . TOTAL HIP ARTHROPLASTY Left 12/30/2016   Procedure: LEFT HIP HEMIARTHROPLASTY  ANTERIOR APPROACH;  Surgeon: Samson Frederic, MD;  Location: [facility];  Service: Orthopedics;  Laterality: Left;  No Known Allergies  Outpatient Encounter Medications as of 05/15/2017  Medication Sig  . aspirin EC 81 MG tablet Take 81 mg by mouth daily.  . divalproex (DEPAKOTE ER) 250 MG 24 hr tablet Take 500 mg by mouth daily.  Marland Kitchen. docusate sodium (COLACE) 100 MG capsule Take 200 mg by mouth 2 (two) times daily as needed for mild constipation.  Marland Kitchen. HYDROcodone-acetaminophen (NORCO/VICODIN) 5-325 MG tablet Take 1 tablet by mouth every morning. Give 2 tablets by mouth every 6 hours as needed for severe pain  . levothyroxine (SYNTHROID, LEVOTHROID) 88 MCG tablet Take 88 mcg by mouth daily before breakfast.  . LORazepam (ATIVAN) 0.5 MG tablet Take 0.5 mg  by mouth 2 (two) times daily. Give 1 tablet by mouth as needed for anxiety  . metoprolol succinate (TOPROL-XL) 25 MG 24 hr tablet Take 1 tablet (25 mg total) by mouth daily.  . mirtazapine (REMERON) 15 MG tablet Take 15 mg by mouth at bedtime.  . Multiple Vitamins-Minerals (CERTAVITE SENIOR/ANTIOXIDANT) TABS Take 1 tablet by mouth daily.  . risperiDONE (RISPERDAL) 2 MG tablet Take 2 mg by mouth at bedtime.  . senna-docusate (SENOKOT-S) 8.6-50 MG tablet Take 1 tablet by mouth at bedtime as needed for mild constipation.   No facility-administered encounter medications on file as of 05/15/2017.     Review of Systems  Unable to perform ROS: Dementia    Immunization History  Administered Date(s) Administered  . Influenza-Unspecified 12/11/2010, 11/18/2011, 11/25/2012, 11/15/2014, 12/19/2015, 11/26/2016  . Pneumococcal Conjugate-13 12/05/2014  . Pneumococcal Polysaccharide-23 08/02/2005  . Td 12/05/2014  . Tdap 08/02/2004  . Zoster 08/03/2006   Pertinent  Health Maintenance Due  Topic Date Due  . INFLUENZA VACCINE  09/10/2017  . PNA vac Low Risk Adult  Completed   Fall Risk  04/28/2017  Falls in the past year? Yes  Number falls in past yr: 2 or more  Injury with Fall? Yes   Functional Status Survey:    Vitals:   05/15/17 1132  Weight: 116 lb 3.2 oz (52.7 kg)   Body mass index is 18.2 kg/m. Physical Exam  Constitutional: No distress.  Thin and frail  HENT:  Head: Normocephalic and atraumatic.  Nose: Nose normal.  Mouth/Throat: Oropharynx is clear and moist. No oropharyngeal exudate.  Neck: No JVD present. No thyromegaly present.  Cardiovascular: Normal rate and regular rhythm.  No murmur heard. Pulmonary/Chest: Effort normal and breath sounds normal. No respiratory distress. He has no wheezes.  Abdominal: Soft. Bowel sounds are normal. He exhibits no distension. There is no tenderness.  Lymphadenopathy:    He has no cervical adenopathy.  Neurological: He is alert.    Skin: Skin is warm and dry. He is not diaphoretic.    Labs reviewed: Recent Labs    12/31/16 0602  01/06/17 0555 01/08/17 0603 01/09/17 0552 03/18/17 0400  NA 138   < > 144 140 137 139  K 3.8   < > 3.4* 3.7 3.8 4.5  CL 107   < > 110 107 106  --   CO2 27   < > 27 27 25   --   GLUCOSE 102*   < > 144* 96 94  --   BUN 32*   < > 23* 18 17 17   CREATININE 0.91   < > 0.79 0.84 0.78 0.7  CALCIUM 8.1*   < > 8.8* 8.7* 8.5*  --   MG 2.0  --   --   --   --   --    < > =  values in this interval not displayed.   Recent Labs    03/18/17 0400  AST 16  ALT 9*  ALKPHOS 117   Recent Labs    12/30/16 0611 12/30/16 0901 12/31/16 0602  01/02/17 0851 01/03/17 0624 01/05/17 0512 03/18/17 0400  WBC QUESTIONABLE RESULTS, RECOMMEND RECOLLECT TO VERIFY 13.8* 9.9   < > 6.7 6.4 5.4 5.8  NEUTROABS QUESTIONABLE RESULTS, RECOMMEND RECOLLECT TO VERIFY 12.5* 8.3*  --   --   --   --   --   HGB QUESTIONABLE RESULTS, RECOMMEND RECOLLECT TO VERIFY 10.6* 9.5*   < > 10.6* 10.3* 9.2* 10.0*  HCT QUESTIONABLE RESULTS, RECOMMEND RECOLLECT TO VERIFY 32.1* 28.9*   < > 32.0* 30.6* 27.5* 31*  MCV QUESTIONABLE RESULTS, RECOMMEND RECOLLECT TO VERIFY 96.7 98.0   < > 95.5 94.7 96.2  --   PLT QUESTIONABLE RESULTS, RECOMMEND RECOLLECT TO VERIFY 227 195   < > 220 229 262 339   < > = values in this interval not displayed.   Lab Results  Component Value Date   TSH 2.71 04/22/2017   Lab Results  Component Value Date   HGBA1C 6.0 12/19/2015   Lab Results  Component Value Date   CHOL 238 (A) 04/01/2016   HDL 44 04/01/2016   LDLCALC 153 04/01/2016   TRIG 200 (A) 04/01/2016    Significant Diagnostic Results in last 30 days:  No results found.  Assessment/Plan  1. Weight loss Due to poor intake, advancing dementia, and dehydration. The staff will continue to encourage intake as he is able.  They frequently offer snacks and nutritional supplements. He appears dehydrated but would due to his agitation would not be  appropriate for IVF.   Continue to monitor to provide quality of life and comfort.  2. Advanced care planning/counseling discussion I called and spoke with Ms. Morrison Old, the [facility] POA for Mr. Weichert. I let her know that he has shown signs of decline such as weight loss of 16 lbs in the past 3 months, weakness, sleeping more, and agitation related to dementia. He has been followed by geriatric psych with little benefit with medication changes. He appears to be nearing the end of life but is not acutely ill. He appears dehydrated on exam as well. We discussed his overall poor prognosis and poor quality of life. She agreed to an order for no hospitalizations, no IVs, and no feeding tubes. She will be in to visit next week and will review the most form and sign it. She declined hospice as she did not feel he needed the service.  I spent 18 minutes in conversation with the POA.  Family/ staff Communication: as above Labs/tests ordered:  NA

## 2017-06-11 ENCOUNTER — Non-Acute Institutional Stay (SKILLED_NURSING_FACILITY): Payer: Medicare Other | Admitting: Adult Health

## 2017-06-11 ENCOUNTER — Encounter: Payer: Self-pay | Admitting: Adult Health

## 2017-06-11 DIAGNOSIS — F0151 Vascular dementia with behavioral disturbance: Secondary | ICD-10-CM | POA: Diagnosis not present

## 2017-06-11 DIAGNOSIS — F01518 Vascular dementia, unspecified severity, with other behavioral disturbance: Secondary | ICD-10-CM

## 2017-06-11 DIAGNOSIS — I10 Essential (primary) hypertension: Secondary | ICD-10-CM

## 2017-06-11 DIAGNOSIS — R627 Adult failure to thrive: Secondary | ICD-10-CM

## 2017-06-11 DIAGNOSIS — S22038D Other fracture of third thoracic vertebra, subsequent encounter for fracture with routine healing: Secondary | ICD-10-CM

## 2017-06-11 DIAGNOSIS — D638 Anemia in other chronic diseases classified elsewhere: Secondary | ICD-10-CM | POA: Insufficient documentation

## 2017-06-11 NOTE — Assessment & Plan Note (Signed)
Discontinue scheduled norco as he does not appear to be in pain but continue prn dosing.

## 2017-06-11 NOTE — Assessment & Plan Note (Signed)
Improved behavior. Does not participate in memory care activities. Followed by Dr. Donell Beers. Continue Depakote and Remeron.

## 2017-06-11 NOTE — Assessment & Plan Note (Signed)
Controlled without medication

## 2017-06-11 NOTE — Progress Notes (Signed)
Location:  Medical illustrator of Service:  SNF (31) Provider:   Peggye Ley, ANP [facility] [phone]   Kermit Balo, DO  Patient Care Team: Kermit Balo, DO as PCP - General (Geriatric Medicine) Samson Frederic, MD as Consulting Physician (Orthopedic Surgery)  Extended Emergency Contact Information Primary Emergency Contact: Lambeth,Judith Address: [address]          [place], [place] [address] [place] of [place] Home Phone: [phone] Mobile Phone: [phone] Relation: Relative Secondary Emergency Contact: Curley Spice [place] of [place] Home Phone: [phone] Relation: Niece  Code Status:  DNR Goals of care: Advanced Directive information Advanced Directives 04/28/2017  Does Patient Have a Medical Advance Directive? Yes  Type of Estate agent of [place];Out of facility DNR (pink MOST or yellow form)  Does patient want to make changes to medical advance directive? No - Patient declined  Copy of Healthcare Power of Attorney in Chart? Yes  Would patient like information on creating a medical advance directive? No - Patient declined  Pre-existing out of facility DNR order (yellow form or pink MOST form) Yellow form placed in chart (order not valid for inpatient use)     Chief Complaint  Patient presents with  . Medical Management of Chronic Issues    HPI:  Pt is a 84 y.o. male seen today for medical management of chronic diseases. He resides in memory care due to advanced dementia.  The nurse reports that he has had improvement in his behavior over the last few days with less combativeness and playing in stool. He no longer takes ativan prn for agitation. Currently he is on Remeron and Depakote. Last Dep level 48 05/04/17.  His appetite remains variable, see weights. He takes boost and eats 1-2 meals a day. The staff report that when he does eat, he tends to eat 100% but he sleeps through  meals at times.  Wt Readings from Last 3 Encounters:  06/11/17 118 lb 3.2 oz (53.6 kg)  05/15/17 116 lb 3.2 oz (52.7 kg)  04/28/17 121 lb (54.9 kg)    Anemia:  Lab Results  Component Value Date   HGB 10.0 (A) 03/18/2017   He remains on scheduled Norco but the nurse does not feel this helped with his behavior. We were never quite sure if he was in pain due to his advanced dementia. He has a hx of PMR but no obvious joint pain, swelling, redness, fever, etc Also has a hx of thoracic fractures of the spinous processes  Past Medical History:  Diagnosis Date  . Anemia   . Benign positional vertigo   . Bilateral carpal tunnel syndrome   . BPH (benign prostatic hyperplasia)   . Chest pain    noncardiac  . Chronic anal fissure   . Chronic low back pain   . Dementia   . Disease    perrone  . Diverticulosis   . DJD (degenerative joint disease)   . Elevated blood sugar   . GERD (gastroesophageal reflux disease)   . Gilbert's syndrome   . H/O thyroidectomy   . Heart murmur   . Hypercholesteremia   . Hypertension    labils  . Hyperthyroidism   . Iron deficiency   . Lichen planus   . Peripheral neuropathy   . Polymyalgia rheumatica (HCC)    Past Surgical History:  Procedure Laterality Date  . APPENDECTOMY    . bottom teeth removed with implants    .  hemrrhoidectomy    . left and right cataract    . left lens implant    . THYROIDECTOMY, PARTIAL    . TONSILLECTOMY    . TOTAL HIP ARTHROPLASTY Left 12/30/2016   Procedure: LEFT HIP HEMIARTHROPLASTY  ANTERIOR APPROACH;  Surgeon: Samson Frederic, MD;  Location: [facility];  Service: Orthopedics;  Laterality: Left;    No Known Allergies  Outpatient Encounter Medications as of 06/11/2017  Medication Sig  . aspirin EC 81 MG tablet Take 81 mg by mouth daily.  . divalproex (DEPAKOTE ER) 250 MG 24 hr tablet Take 500 mg by mouth daily.  Marland Kitchen docusate sodium (COLACE) 100 MG capsule Take 200 mg by mouth 2 (two) times daily as needed for mild  constipation.  Marland Kitchen HYDROcodone-acetaminophen (NORCO/VICODIN) 5-325 MG tablet Take 1 tablet by mouth every morning. Give 2 tablets by mouth every 6 hours as needed for severe pain  . lactose free nutrition (BOOST) LIQD Take 237 mLs by mouth 2 (two) times daily between meals.  Marland Kitchen levothyroxine (SYNTHROID, LEVOTHROID) 88 MCG tablet Take 88 mcg by mouth daily before breakfast.  . metoprolol succinate (TOPROL-XL) 25 MG 24 hr tablet Take 1 tablet (25 mg total) by mouth daily.  . mirtazapine (REMERON) 30 MG tablet Take 30 mg by mouth at bedtime.  . Multiple Vitamins-Minerals (CERTAVITE SENIOR/ANTIOXIDANT) TABS Take 1 tablet by mouth daily.  . risperiDONE (RISPERDAL) 2 MG tablet Take 2 mg by mouth at bedtime.  . senna-docusate (SENOKOT-S) 8.6-50 MG tablet Take 1 tablet by mouth at bedtime as needed for mild constipation.  . [DISCONTINUED] LORazepam (ATIVAN) 0.5 MG tablet Take 0.5 mg by mouth 2 (two) times daily. Give 1 tablet by mouth as needed for anxiety  . [DISCONTINUED] mirtazapine (REMERON) 15 MG tablet Take 15 mg by mouth at bedtime.   No facility-administered encounter medications on file as of 06/11/2017.     Review of Systems  Unable to perform ROS: Dementia    Immunization History  Administered Date(s) Administered  . Influenza-Unspecified 12/11/2010, 11/18/2011, 11/25/2012, 11/15/2014, 12/19/2015, 11/26/2016  . Pneumococcal Conjugate-13 12/05/2014  . Pneumococcal Polysaccharide-23 08/02/2005  . Td 12/05/2014  . Tdap 08/02/2004  . Zoster 08/03/2006   Pertinent  Health Maintenance Due  Topic Date Due  . INFLUENZA VACCINE  09/10/2017  . PNA vac Low Risk Adult  Completed   Fall Risk  04/28/2017  Falls in the past year? Yes  Number falls in past yr: 2 or more  Injury with Fall? Yes   Functional Status Survey:    Vitals:   06/11/17 1438  Weight: 118 lb 3.2 oz (53.6 kg)   Body mass index is 18.51 kg/m. Physical Exam  Constitutional: No distress.  Frail and thin  HENT:  Head:  Normocephalic and atraumatic.  Neck: No JVD present.  Cardiovascular: Normal rate and regular rhythm.  Murmur heard. Pulmonary/Chest: Effort normal and breath sounds normal. No respiratory distress. He has no wheezes.  Abdominal: Soft. Bowel sounds are normal. He exhibits no distension. There is no tenderness.  Musculoskeletal:  MAE  Lymphadenopathy:    He has no cervical adenopathy.  Neurological: He is alert.  Not able to f/c.  Does not verbalize.  Skin: Skin is warm and dry. He is not diaphoretic.  Psychiatric: He has a normal mood and affect.  Nursing note and vitals reviewed.   Labs reviewed: Recent Labs    12/31/16 0602  01/06/17 0555 01/08/17 0603 01/09/17 0552 03/18/17 0400  NA 138   < > 144 140  137 139  K 3.8   < > 3.4* 3.7 3.8 4.5  CL 107   < > 110 107 106  --   CO2 27   < > --   GLUCOSE 102*   < > 144* 96 94  --   BUN 32*   < > 23* CREATININE 0.91   < > 0.79 0.84 0.78 0.7  CALCIUM 8.1*   < > 8.8* 8.7* 8.5*  --   MG 2.0  --   --   --   --   --    < > = values in this interval not displayed.   Recent Labs    03/18/17 0400  AST 16  ALT 9*  ALKPHOS 117   Recent Labs    12/30/16 0611 12/30/16 0901 12/31/16 0602  01/02/17 0851 01/03/17 0624 01/05/17 0512 03/18/17 0400  WBC QUESTIONABLE RESULTS, RECOMMEND RECOLLECT TO VERIFY 13.8* 9.9   < > 6.7 6.4 5.4 5.8  NEUTROABS QUESTIONABLE RESULTS, RECOMMEND RECOLLECT TO VERIFY 12.5* 8.3*  --   --   --   --   --   HGB QUESTIONABLE RESULTS, RECOMMEND RECOLLECT TO VERIFY 10.6* 9.5*   < > 10.6* 10.3* 9.2* 10.0*  HCT QUESTIONABLE RESULTS, RECOMMEND RECOLLECT TO VERIFY 32.1* 28.9*   < > 32.0* 30.6* 27.5* 31*  MCV QUESTIONABLE RESULTS, RECOMMEND RECOLLECT TO VERIFY 96.7 98.0   < > 95.5 94.7 96.2  --   PLT QUESTIONABLE RESULTS, RECOMMEND RECOLLECT TO VERIFY 227 195   < > 220 229 262 339   < > = values in this interval not displayed.   Lab Results  Component Value Date   TSH 2.71 04/22/2017   Lab  Results  Component Value Date   HGBA1C 6.0 12/19/2015   Lab Results  Component Value Date   CHOL 238 (A) 04/01/2016   HDL 44 04/01/2016   LDLCALC 153 04/01/2016   TRIG 200 (A) 04/01/2016    Significant Diagnostic Results in last 30 days:  No results found.  Assessment/Plan  Essential hypertension Controlled without medication.   Vascular dementia with behavior disturbance Improved behavior. Does not participate in memory care activities. Followed by Dr. Donell Beers. Continue Depakote and Remeron.   Anemia of chronic disease Hgb 10, continue to monitor  Failure to thrive in adult Continue to monitor weight Continue nutritional supplement Most form indicates no hospitalizations.   Thoracic spine fracture (HCC) Discontinue scheduled norco as he does not appear to be in pain but continue prn dosing.    Family/ staff Communication: discussed with nurse   Labs/tests ordered:  NA

## 2017-06-11 NOTE — Assessment & Plan Note (Signed)
Continue to monitor weight Continue nutritional supplement Most form indicates no hospitalizations.

## 2017-06-11 NOTE — Assessment & Plan Note (Signed)
Hgb 10, continue to monitor

## 2017-06-17 ENCOUNTER — Other Ambulatory Visit: Payer: Self-pay | Admitting: Internal Medicine

## 2017-06-17 DIAGNOSIS — F419 Anxiety disorder, unspecified: Secondary | ICD-10-CM

## 2017-06-17 MED ORDER — LORAZEPAM 0.5 MG PO TABS: 0.5000 mg | ORAL_TABLET | Freq: Two times a day (BID) | ORAL | 3 refills | Status: DC

## 2017-08-10 ENCOUNTER — Non-Acute Institutional Stay (SKILLED_NURSING_FACILITY): Payer: Medicare Other | Admitting: Adult Health

## 2017-08-10 ENCOUNTER — Encounter: Payer: Self-pay | Admitting: Adult Health

## 2017-08-10 DIAGNOSIS — I10 Essential (primary) hypertension: Secondary | ICD-10-CM

## 2017-08-10 DIAGNOSIS — R1310 Dysphagia, unspecified: Secondary | ICD-10-CM

## 2017-08-10 DIAGNOSIS — D638 Anemia in other chronic diseases classified elsewhere: Secondary | ICD-10-CM | POA: Diagnosis not present

## 2017-08-10 DIAGNOSIS — E039 Hypothyroidism, unspecified: Secondary | ICD-10-CM

## 2017-08-10 DIAGNOSIS — F0151 Vascular dementia with behavioral disturbance: Secondary | ICD-10-CM | POA: Diagnosis not present

## 2017-08-10 DIAGNOSIS — R627 Adult failure to thrive: Secondary | ICD-10-CM | POA: Diagnosis not present

## 2017-08-10 DIAGNOSIS — F01518 Vascular dementia, unspecified severity, with other behavioral disturbance: Secondary | ICD-10-CM

## 2017-08-10 NOTE — Progress Notes (Signed)
[facility]   [facility] of Service:   SNF memory care Provider:   Peggye Leyhristy Lindwood Mogel, ANP [facility] [phone]  [name], [name], DO  Patient Care Team: [name], [name], DO as PCP - General (Geriatric Medicine) Samson FredericSwinteck, Brian, MD as Consulting Physician (Orthopedic Surgery)  Extended Emergency Contact Information Primary Emergency Contact: Lambeth,Judith Address: [address]          [place], [place] [address] [place] Home Phone: [phone] Mobile Phone: [phone] Relation: Relative Secondary Emergency Contact: Curley SpiceBowman,Una  [place] Home Phone: [phone] Relation: Niece  Code Status:  DNR Goals of care: Advanced Directive information Advanced Directives 04/28/2017  Does Patient Have a Medical Advance Directive? Yes  Type of Estate agentAdvance Directive Healthcare Power of [place];Out of facility DNR (pink MOST or yellow form)  Does patient want to make changes to medical advance directive? No - Patient declined  Copy of Healthcare Power of Attorney in Chart? Yes  Would patient like information on creating a medical advance directive? No - Patient declined  Pre-existing out of facility DNR order (yellow form or pink MOST form) Yellow form placed in chart (order not valid for inpatient use)     Chief Complaint  Patient presents with  . Medical Management of Chronic Issues    HPI:  Pt is a 84 y.o. male seen today for medical management of chronic diseases.  He resides in memory care. There are no acute complaints regarding his care.    Dysphagia: the nurse reported difficulty swallowing pills in June, as well as food and so on 07/31/17: Downgrade diet mech soft diet ground with NTL No new issues reported of difficulty swallowing. His weight is stable but low at 117 lbs.   Vascular dementia: He is not able to answer q's for MMSE testing. He is combative with care and prefers to be left alone in his room. He  does not participate in activities. He is followed by geriatric psych for his behaviors and currently on depakote and risperdal.  He continues to sleep most of the morning and eat later in the day.  He has a most form indicating no hospitalizations due to his poor qol.    ACD:  Lab Results  Component Value Date   HGB 10.0 (A) 03/18/2017   HTN: controlled  Hypothyroidism: Improved significantly since his admission as he is taking his medications regularly Lab Results  Component Value Date   TSH 2.71 04/22/2017     Past Medical History:  Diagnosis Date  . Anemia   . Benign positional vertigo   . Bilateral carpal tunnel syndrome   . BPH (benign prostatic hyperplasia)   . Chest pain    noncardiac  . Chronic anal fissure   . Chronic low back pain   . Dementia   . Disease    perrone  . Diverticulosis   . DJD (degenerative joint disease)   . Elevated blood sugar   . GERD (gastroesophageal reflux disease)   . Gilbert's syndrome   . H/O thyroidectomy   . Heart murmur   . Hypercholesteremia   . Hypertension    labils  . Hyperthyroidism   . Iron deficiency   . Lichen planus   . Peripheral neuropathy   . Polymyalgia rheumatica (HCC)    Past Surgical History:  Procedure Laterality Date  . APPENDECTOMY    . bottom teeth removed with implants    . hemrrhoidectomy    . left and  right cataract    . left lens implant    . THYROIDECTOMY, PARTIAL    . TONSILLECTOMY    . TOTAL HIP ARTHROPLASTY Left 12/30/2016   Procedure: LEFT HIP HEMIARTHROPLASTY  ANTERIOR APPROACH;  Surgeon: Samson Frederic, MD;  Location: [facility];  Service: Orthopedics;  Laterality: Left;    No Known Allergies  Outpatient Encounter Medications as of 08/10/2017  Medication Sig  . divalproex (DEPAKOTE SPRINKLE) 125 MG capsule Take 250 mg by mouth 3 (three) times daily.  Marland Kitchen docusate (COLACE) 50 MG/5ML liquid Take 200 mg by mouth 2 (two) times daily.  . metoprolol tartrate (LOPRESSOR) 25 MG tablet Take 12.5 mg  by mouth 2 (two) times daily.  . risperiDONE (RISPERDAL) 1 MG tablet Take 1 mg by mouth at bedtime.  Marland Kitchen aspirin EC 81 MG tablet Take 81 mg by mouth daily.  Marland Kitchen lactose free nutrition (BOOST) LIQD Take 237 mLs by mouth 2 (two) times daily between meals.  Marland Kitchen levothyroxine (SYNTHROID, LEVOTHROID) 88 MCG tablet Take 88 mcg by mouth daily before breakfast.  . LORazepam (ATIVAN) 0.5 MG tablet Take 1 tablet (0.5 mg total) by mouth 2 (two) times daily.  . mirtazapine (REMERON) 30 MG tablet Take 30 mg by mouth at bedtime.  . senna-docusate (SENOKOT-S) 8.6-50 MG tablet Take 1 tablet by mouth at bedtime as needed for mild constipation.  . [DISCONTINUED] divalproex (DEPAKOTE ER) 250 MG 24 hr tablet Take 500 mg by mouth daily.  . [DISCONTINUED] docusate sodium (COLACE) 100 MG capsule Take 200 mg by mouth 2 (two) times daily as needed for mild constipation.  . [DISCONTINUED] HYDROcodone-acetaminophen (NORCO/VICODIN) 5-325 MG tablet Take 1 tablet by mouth every morning. Give 2 tablets by mouth every 6 hours as needed for severe pain  . [DISCONTINUED] metoprolol succinate (TOPROL-XL) 25 MG 24 hr tablet Take 1 tablet (25 mg total) by mouth daily.  . [DISCONTINUED] Multiple Vitamins-Minerals (CERTAVITE SENIOR/ANTIOXIDANT) TABS Take 1 tablet by mouth daily.  . [DISCONTINUED] risperiDONE (RISPERDAL) 2 MG tablet Take 2 mg by mouth at bedtime.   No facility-administered encounter medications on file as of 08/10/2017.     Review of Systems  Unable to perform ROS: Dementia    Immunization History  Administered Date(s) Administered  . Influenza-Unspecified 12/11/2010, 11/18/2011, 11/25/2012, 11/15/2014, 12/19/2015, 11/26/2016  . Pneumococcal Conjugate-13 12/05/2014  . Pneumococcal Polysaccharide-23 08/02/2005  . Td 12/05/2014  . Tdap 08/02/2004  . Zoster 08/03/2006   Pertinent  Health Maintenance Due  Topic Date Due  . INFLUENZA VACCINE  09/10/2017  . PNA vac Low Risk Adult  Completed   Fall Risk  04/28/2017    Falls in the past year? Yes  Number falls in past yr: 2 or more  Injury with Fall? Yes   Functional Status Survey:   Wt Readings from Last 3 Encounters:  08/10/17 117 lb (53.1 kg)  06/11/17 118 lb 3.2 oz (53.6 kg)  05/15/17 116 lb 3.2 oz (52.7 kg)   Vitals:   08/10/17 1412  Weight: 117 lb (53.1 kg)   Body mass index is 18.32 kg/m. Physical Exam  Constitutional: No distress.  Frail and thin  HENT:  Head: Normocephalic and atraumatic.  Eyes: Pupils are equal, round, and reactive to light. Conjunctivae are normal. Right eye exhibits no discharge. Left eye exhibits no discharge.  Neck: No JVD present.  Cardiovascular: Normal rate and regular rhythm.  Murmur heard. Pulmonary/Chest: Effort normal and breath sounds normal. No respiratory distress.  Abdominal: Soft. Bowel sounds are normal.  Musculoskeletal: He  exhibits no edema or tenderness.  Neurological:  Sleepy but easily arouses, not able to f/c  Skin: Skin is warm and dry. He is not diaphoretic.    Labs reviewed: Recent Labs    12/31/16 0602  01/06/17 0555 01/08/17 0603 01/09/17 0552 03/18/17 0400  NA 138   < > 144 140 137 139  K 3.8   < > 3.4* 3.7 3.8 4.5  CL 107   < > 110 107 106  --   CO2 27   < > 27 27 25   --   GLUCOSE 102*   < > 144* 96 94  --   BUN 32*   < > 23* 18 17 17   CREATININE 0.91   < > 0.79 0.84 0.78 0.7  CALCIUM 8.1*   < > 8.8* 8.7* 8.5*  --   MG 2.0  --   --   --   --   --    < > = values in this interval not displayed.   Recent Labs    03/18/17 0400  AST 16  ALT 9*  ALKPHOS 117   Recent Labs    12/30/16 0611 12/30/16 0901 12/31/16 0602  01/02/17 0851 01/03/17 0624 01/05/17 0512 03/18/17 0400  WBC QUESTIONABLE RESULTS, RECOMMEND RECOLLECT TO VERIFY 13.8* 9.9   < > 6.7 6.4 5.4 5.8  NEUTROABS QUESTIONABLE RESULTS, RECOMMEND RECOLLECT TO VERIFY 12.5* 8.3*  --   --   --   --   --   HGB QUESTIONABLE RESULTS, RECOMMEND RECOLLECT TO VERIFY 10.6* 9.5*   < > 10.6* 10.3* 9.2* 10.0*  HCT  QUESTIONABLE RESULTS, RECOMMEND RECOLLECT TO VERIFY 32.1* 28.9*   < > 32.0* 30.6* 27.5* 31*  MCV QUESTIONABLE RESULTS, RECOMMEND RECOLLECT TO VERIFY 96.7 98.0   < > 95.5 94.7 96.2  --   PLT QUESTIONABLE RESULTS, RECOMMEND RECOLLECT TO VERIFY 227 195   < > 220 229 262 339   < > = values in this interval not displayed.   Lab Results  Component Value Date   TSH 2.71 04/22/2017   Lab Results  Component Value Date   HGBA1C 6.0 12/19/2015   Lab Results  Component Value Date   CHOL 238 (A) 04/01/2016   HDL 44 04/01/2016   LDLCALC 153 04/01/2016   TRIG 200 (A) 04/01/2016   07/10/17: dep level 48 Significant Diagnostic Results in last 30 days:  No results found.  Assessment/Plan  1. Vascular dementia with behavior disturbance Remains combative and non participatory in the memory care setting.  Severe dementia nearing the end of life. His goals of care are comfort based, his POA declined hospice.   2. Essential hypertension Controlled  3. Anemia of chronic disease Multifactorial  Hgb 10 in Feb Avoid further labs unless indicated due to his behavior and goals of care  4. Dysphagia, unspecified type Asp prec  1:1 supervision Continue mech soft diet ground with NTL  5. Failure to thrive in adult Remains with low weight, poor quality of life, and behavioral issues.  Continue nutritional supplements and supportive care.   6. Acquired hypothyroidism Improved Continue synthroid 88 mcg  Family/ staff Communication: staff  Labs/tests ordered:  NA

## 2017-08-12 DIAGNOSIS — R1310 Dysphagia, unspecified: Secondary | ICD-10-CM | POA: Insufficient documentation

## 2017-08-31 ENCOUNTER — Non-Acute Institutional Stay (SKILLED_NURSING_FACILITY): Payer: Medicare Other | Admitting: Adult Health

## 2017-08-31 DIAGNOSIS — R059 Cough, unspecified: Secondary | ICD-10-CM

## 2017-08-31 DIAGNOSIS — R05 Cough: Secondary | ICD-10-CM | POA: Diagnosis not present

## 2017-08-31 DIAGNOSIS — R1310 Dysphagia, unspecified: Secondary | ICD-10-CM

## 2017-09-01 ENCOUNTER — Encounter: Payer: Self-pay | Admitting: Adult Health

## 2017-09-01 NOTE — Progress Notes (Signed)
Location:  Medical illustrator of Service:  SNF (31) Provider:   Peggye Ley, ANP [facility] [phone]   Kermit Balo, DO  Patient Care Team: Kermit Balo, DO as PCP - General (Geriatric Medicine) Samson Frederic, MD as Consulting Physician (Orthopedic Surgery)  Extended Emergency Contact Information Primary Emergency Contact: Lambeth,Judith Address: [address]          [place], [place] [address] [place] of [place] Home Phone: [phone] Mobile Phone: [phone] Relation: Relative Secondary Emergency Contact: Curley Spice [place] of [place] Home Phone: [phone] Relation: Niece  Code Status:  DNR Goals of care: Advanced Directive information Advanced Directives 04/28/2017  Does Patient Have a Medical Advance Directive? Yes  Type of Estate agent of [place];Out of facility DNR (pink MOST or yellow form)  Does patient want to make changes to medical advance directive? No - Patient declined  Copy of Healthcare Power of Attorney in Chart? Yes  Would patient like information on creating a medical advance directive? No - Patient declined  Pre-existing out of facility DNR order (yellow form or pink MOST form) Yellow form placed in chart (order not valid for inpatient use)     Chief Complaint  Patient presents with  . Acute Visit    cough    HPI:  Pt is a 84 y.o. male seen today for an acute visit for cough present for about 1 week per the nsg notes. He has advanced dementia with combative behavior and is not able to provide a history. He has had issues with dysphagia and is on mech soft diet with NTL. He has a most form indicating comfort care with antibiotics "to be determined".  No fever or decreased 02 sats and no sputum production. The nurse reports his pills are taken crushed with no issues. The cough is worse at night, not correlated with meals but seems to be better over the past 24 hrs.     Past Medical History:  Diagnosis Date  . Anemia   . Benign positional vertigo   . Bilateral carpal tunnel syndrome   . BPH (benign prostatic hyperplasia)   . Chest pain    noncardiac  . Chronic anal fissure   . Chronic low back pain   . Dementia   . Disease    perrone  . Diverticulosis   . DJD (degenerative joint disease)   . Elevated blood sugar   . GERD (gastroesophageal reflux disease)   . Gilbert's syndrome   . H/O thyroidectomy   . Heart murmur   . Hypercholesteremia   . Hypertension    labils  . Hyperthyroidism   . Iron deficiency   . Lichen planus   . Peripheral neuropathy   . Polymyalgia rheumatica (HCC)    Past Surgical History:  Procedure Laterality Date  . APPENDECTOMY    . bottom teeth removed with implants    . hemrrhoidectomy    . left and right cataract    . left lens implant    . THYROIDECTOMY, PARTIAL    . TONSILLECTOMY    . TOTAL HIP ARTHROPLASTY Left 12/30/2016   Procedure: LEFT HIP HEMIARTHROPLASTY  ANTERIOR APPROACH;  Surgeon: Samson Frederic, MD;  Location: [facility];  Service: Orthopedics;  Laterality: Left;    No Known Allergies  Outpatient Encounter Medications as of 08/31/2017  Medication Sig  . aspirin EC 81 MG tablet Take 81 mg by mouth daily.  . divalproex (DEPAKOTE SPRINKLE) 125 MG capsule  Take 250 mg by mouth 3 (three) times daily.  Marland Kitchen docusate (COLACE) 50 MG/5ML liquid Take 200 mg by mouth 2 (two) times daily.  Marland Kitchen lactose free nutrition (BOOST) LIQD Take 237 mLs by mouth 2 (two) times daily between meals.  Marland Kitchen levothyroxine (SYNTHROID, LEVOTHROID) 88 MCG tablet Take 88 mcg by mouth daily before breakfast.  . LORazepam (ATIVAN) 0.5 MG tablet Take 1 tablet (0.5 mg total) by mouth 2 (two) times daily.  . metoprolol tartrate (LOPRESSOR) 25 MG tablet Take 12.5 mg by mouth 2 (two) times daily.  . mirtazapine (REMERON) 30 MG tablet Take 30 mg by mouth at bedtime.  . risperiDONE (RISPERDAL) 1 MG tablet Take 1 mg by mouth at bedtime.  .  senna-docusate (SENOKOT-S) 8.6-50 MG tablet Take 1 tablet by mouth at bedtime as needed for mild constipation.   No facility-administered encounter medications on file as of 08/31/2017.     Review of Systems  Unable to perform ROS: Dementia    Immunization History  Administered Date(s) Administered  . Influenza-Unspecified 12/11/2010, 11/18/2011, 11/25/2012, 11/15/2014, 12/19/2015, 11/26/2016  . Pneumococcal Conjugate-13 12/05/2014  . Pneumococcal Polysaccharide-23 08/02/2005  . Td 12/05/2014  . Tdap 08/02/2004  . Zoster 08/03/2006   Pertinent  Health Maintenance Due  Topic Date Due  . INFLUENZA VACCINE  09/10/2017  . PNA vac Low Risk Adult  Completed   Fall Risk  04/28/2017  Falls in the past year? Yes  Number falls in past yr: 2 or more  Injury with Fall? Yes   Functional Status Survey:    Vitals:   09/01/17 0752  BP: (!) 116/58  Pulse: (!) 59  Resp: 18  Temp: 97.9 F (36.6 C)  SpO2: 93%   There is no height or weight on file to calculate BMI. Physical Exam  Constitutional: No distress.  HENT:  Head: Normocephalic and atraumatic.  Right Ear: External ear normal.  Left Ear: External ear normal.  Nose: Nose normal.  Refused oral exam, ill fitting dentures  Eyes: Pupils are equal, round, and reactive to light. Conjunctivae are normal. Right eye exhibits no discharge. Left eye exhibits no discharge.  Neck: No JVD present. No tracheal deviation present.  Cardiovascular: Normal rate and regular rhythm.  Murmur heard. Pulmonary/Chest: Effort normal. No respiratory distress. He has no wheezes.  Decreased bases (can not follow commands for a deep breath)  Abdominal: Soft. Bowel sounds are normal. He exhibits no distension. There is no tenderness.  Lymphadenopathy:    He has no cervical adenopathy.  Neurological:  Sleepy but arouses to verbal stim. Not able to follow commands, minimally verbal  Skin: Skin is warm and dry. He is not diaphoretic.  Nursing note and  vitals reviewed.   Labs reviewed: Recent Labs    12/31/16 0602  01/06/17 0555 01/08/17 0603 01/09/17 0552 03/18/17 0400  NA 138   < > 144 140 137 139  K 3.8   < > 3.4* 3.7 3.8 4.5  CL 107   < > 110 107 106  --   CO2 27   < > 27 27 25   --   GLUCOSE 102*   < > 144* 96 94  --   BUN 32*   < > 23* 18 17 17   CREATININE 0.91   < > 0.79 0.84 0.78 0.7  CALCIUM 8.1*   < > 8.8* 8.7* 8.5*  --   MG 2.0  --   --   --   --   --    < > =  values in this interval not displayed.   Recent Labs    03/18/17 0400  AST 16  ALT 9*  ALKPHOS 117   Recent Labs    12/30/16 0611 12/30/16 0901 12/31/16 0602  01/02/17 0851 01/03/17 0624 01/05/17 0512 03/18/17 0400  WBC QUESTIONABLE RESULTS, RECOMMEND RECOLLECT TO VERIFY 13.8* 9.9   < > 6.7 6.4 5.4 5.8  NEUTROABS QUESTIONABLE RESULTS, RECOMMEND RECOLLECT TO VERIFY 12.5* 8.3*  --   --   --   --   --   HGB QUESTIONABLE RESULTS, RECOMMEND RECOLLECT TO VERIFY 10.6* 9.5*   < > 10.6* 10.3* 9.2* 10.0*  HCT QUESTIONABLE RESULTS, RECOMMEND RECOLLECT TO VERIFY 32.1* 28.9*   < > 32.0* 30.6* 27.5* 31*  MCV QUESTIONABLE RESULTS, RECOMMEND RECOLLECT TO VERIFY 96.7 98.0   < > 95.5 94.7 96.2  --   PLT QUESTIONABLE RESULTS, RECOMMEND RECOLLECT TO VERIFY 227 195   < > 220 229 262 339   < > = values in this interval not displayed.   Lab Results  Component Value Date   TSH 2.71 04/22/2017   Lab Results  Component Value Date   HGBA1C 6.0 12/19/2015   Lab Results  Component Value Date   CHOL 238 (A) 04/01/2016   HDL 44 04/01/2016   LDLCALC 153 04/01/2016   TRIG 200 (A) 04/01/2016    Significant Diagnostic Results in last 30 days:  No results found.  Assessment/Plan  1. Cough Improved per staff No fever or desaturation Not able to order duonebs due to combative behavior my resident Utilize robitussin cough syrup as needed per facility protocol  2. Dysphagia, unspecified type Continue mech soft diet with ground meats and NTL Due to his behavioral  issues he would not benefit from speech therapy Recommend frequent oral care and avoid ill fitting dentures  Family/ staff Communication: staff  Labs/tests ordered:  NA

## 2017-09-15 ENCOUNTER — Encounter: Payer: Self-pay | Admitting: Internal Medicine

## 2017-09-15 ENCOUNTER — Non-Acute Institutional Stay (SKILLED_NURSING_FACILITY): Payer: Medicare Other | Admitting: Internal Medicine

## 2017-09-15 DIAGNOSIS — F01518 Vascular dementia, unspecified severity, with other behavioral disturbance: Secondary | ICD-10-CM

## 2017-09-15 DIAGNOSIS — F0151 Vascular dementia with behavioral disturbance: Secondary | ICD-10-CM

## 2017-09-15 DIAGNOSIS — R1310 Dysphagia, unspecified: Secondary | ICD-10-CM | POA: Diagnosis not present

## 2017-09-15 DIAGNOSIS — E039 Hypothyroidism, unspecified: Secondary | ICD-10-CM

## 2017-09-15 DIAGNOSIS — R627 Adult failure to thrive: Secondary | ICD-10-CM | POA: Diagnosis not present

## 2017-09-15 DIAGNOSIS — K5901 Slow transit constipation: Secondary | ICD-10-CM

## 2017-09-15 NOTE — Progress Notes (Signed)
Patient ID: Benjamin Banks, male   DOB: 1925/10/29, 84 y.o.   MRN: [id number]  Location:  [facility] Room Number: 314 Memory care Place of Service:  SNF [phone]) Provider:   Kermit Balo, DO  Patient Care Team: Kermit Balo, DO as PCP - General (Geriatric Medicine) Samson Frederic, MD as Consulting Physician (Orthopedic Surgery)  Extended Emergency Contact Information Primary Emergency Contact: Lambeth,Judith Address: [address]          [place], [place] [address] [place] of [place] Home Phone: [phone] Mobile Phone: [phone] Relation: Relative Secondary Emergency Contact: Curley Spice [place] of [place] Home Phone: [phone] Relation: Niece  Code Status:  DNR Goals of care: Advanced Directive information Advanced Directives 09/15/2017  Does Patient Have a Medical Advance Directive? Yes  Type of Estate agent of [place];Living will;Out of facility DNR (pink MOST or yellow form)  Does patient want to make changes to medical advance directive? No - Patient declined  Copy of Healthcare Power of Attorney in Chart? Yes  Would patient like information on creating a medical advance directive? -  Pre-existing out of facility DNR order (yellow form or pink MOST form) Yellow form placed in chart (order not valid for inpatient use);Pink MOST form placed in chart (order not valid for inpatient use)   Chief Complaint  Patient presents with  . Medical Management of Chronic Issues    Routine Visit    HPI:  Pt is a 84 y.o. male seen today for medical management of chronic diseases.    Hypothyroidism:  Remains on levothyroxine .  Lab Results  Component Value Date   TSH 2.71 04/22/2017   Dementia with behaviors:  Continues on depakote, ativan bid, risperdal at bedtime.  Continues to have behaviors during changing--agitation, combativeness and resistance.   Depression:  On remeron and risperdal to help  with this.    Has had to have colace held quite often due to loose stools.    He gained a little weight while on the ground diet.  He was degraded to puree due to coughing during meals.  His POA was notified by nursing and told that if he did not eat well, we might go back to ground to maintain weight, but weight improving so far per nursing.   Filed Weights   09/15/17 1202  Weight: 118 lb (53.5 kg)  117 in July, 118 in may, 116 in April, 121 in march, 132 in feb   Past Medical History:  Diagnosis Date  . Anemia   . Benign positional vertigo   . Bilateral carpal tunnel syndrome   . BPH (benign prostatic hyperplasia)   . Chest pain    noncardiac  . Chronic anal fissure   . Chronic low back pain   . Dementia   . Disease    perrone  . Diverticulosis   . DJD (degenerative joint disease)   . Elevated blood sugar   . GERD (gastroesophageal reflux disease)   . Gilbert's syndrome   . H/O thyroidectomy   . Heart murmur   . Hypercholesteremia   . Hypertension    labils  . Hyperthyroidism   . Iron deficiency   . Lichen planus   . Peripheral neuropathy   . Polymyalgia rheumatica (HCC)    Past Surgical History:  Procedure Laterality Date  . APPENDECTOMY    . bottom teeth removed with implants    . hemrrhoidectomy    . left and right cataract    .  left lens implant    . THYROIDECTOMY, PARTIAL    . TONSILLECTOMY    . TOTAL HIP ARTHROPLASTY Left 12/30/2016   Procedure: LEFT HIP HEMIARTHROPLASTY  ANTERIOR APPROACH;  Surgeon: Samson FredericSwinteck, Brian, MD;  Location: [facility];  Service: Orthopedics;  Laterality: Left;    No Known Allergies  Outpatient Encounter Medications as of 09/15/2017  Medication Sig  . aspirin EC 81 MG tablet Take 81 mg by mouth daily.  . divalproex (DEPAKOTE SPRINKLE) 125 MG capsule Take 250 mg by mouth 3 (three) times daily.  Marland Kitchen. docusate (COLACE) 50 MG/5ML liquid Take 200 mg by mouth 2 (two) times daily.  Marland Kitchen. lactose free nutrition (BOOST) LIQD Take 237 mLs by mouth  2 (two) times daily between meals.  Marland Kitchen. levothyroxine (SYNTHROID, LEVOTHROID) 88 MCG tablet Take 88 mcg by mouth daily before breakfast.  . LORazepam (ATIVAN) 0.5 MG tablet Take 1 tablet (0.5 mg total) by mouth 2 (two) times daily.  . metoprolol tartrate (LOPRESSOR) 25 MG tablet Take 12.5 mg by mouth 2 (two) times daily.  . mirtazapine (REMERON) 30 MG tablet Take 30 mg by mouth at bedtime.  . risperiDONE (RISPERDAL) 1 MG tablet Take 1 mg by mouth at bedtime.  . [DISCONTINUED] senna-docusate (SENOKOT-S) 8.6-50 MG tablet Take 1 tablet by mouth at bedtime as needed for mild constipation.   No facility-administered encounter medications on file as of 09/15/2017.     Review of Systems  Immunization History  Administered Date(s) Administered  . Influenza-Unspecified 12/11/2010, 11/18/2011, 11/25/2012, 11/15/2014, 12/19/2015, 11/26/2016  . Pneumococcal Conjugate-13 12/05/2014  . Pneumococcal Polysaccharide-23 08/02/2005  . Td 12/05/2014  . Tdap 08/02/2004  . Zoster 08/03/2006   Pertinent  Health Maintenance Due  Topic Date Due  . INFLUENZA VACCINE  09/10/2017  . PNA vac Low Risk Adult  Completed   Fall Risk  04/28/2017  Falls in the past year? Yes  Number falls in past yr: 2 or more  Injury with Fall? Yes   Functional Status Survey:  dependent in all adls  Vitals:   09/15/17 1202  BP: 125/68  Pulse: 61  Resp: 17  Temp: (!) 97.1 F (36.2 C)  TempSrc: Oral  SpO2: 92%  Weight: 118 lb (53.5 kg)  Height: 5\' 7"  (1.702 m)   Body mass index is 18.48 kg/m. Physical Exam  Constitutional: No distress.  Cachectic white male, nad, resting in bed, awakens, but only speaks 1-2 words  HENT:  Head: Normocephalic and atraumatic.  Cardiovascular: Normal rate, regular rhythm, normal heart sounds and intact distal pulses.  Pulmonary/Chest: Effort normal and breath sounds normal. No respiratory distress.  Abdominal: Soft. Bowel sounds are normal. He exhibits no distension. There is no  tenderness.  Musculoskeletal: Normal range of motion.  Neurological:  Minimally arousable  Skin: Skin is warm and dry.  Psychiatric:  Calm during visit, but pushes my stethoscope away when listening to his heart, lungs, abdomen    Labs reviewed: Recent Labs    12/31/16 0602  01/06/17 0555 01/08/17 0603 01/09/17 0552 03/18/17 0400  NA 138   < > 144 140 137 139  K 3.8   < > 3.4* 3.7 3.8 4.5  CL 107   < > 110 107 106  --   CO2 27   < > 27 27 25   --   GLUCOSE 102*   < > 144* 96 94  --   BUN 32*   < > 23* 18 17 17   CREATININE 0.91   < > 0.79 0.84  0.78 0.7  CALCIUM 8.1*   < > 8.8* 8.7* 8.5*  --   MG 2.0  --   --   --   --   --    < > = values in this interval not displayed.   Recent Labs    03/18/17 0400  AST 16  ALT 9*  ALKPHOS 117   Recent Labs    12/30/16 0611 12/30/16 0901 12/31/16 0602  01/02/17 0851 01/03/17 0624 01/05/17 0512 03/18/17 0400  WBC QUESTIONABLE RESULTS, RECOMMEND RECOLLECT TO VERIFY 13.8* 9.9   < > 6.7 6.4 5.4 5.8  NEUTROABS QUESTIONABLE RESULTS, RECOMMEND RECOLLECT TO VERIFY 12.5* 8.3*  --   --   --   --   --   HGB QUESTIONABLE RESULTS, RECOMMEND RECOLLECT TO VERIFY 10.6* 9.5*   < > 10.6* 10.3* 9.2* 10.0*  HCT QUESTIONABLE RESULTS, RECOMMEND RECOLLECT TO VERIFY 32.1* 28.9*   < > 32.0* 30.6* 27.5* 31*  MCV QUESTIONABLE RESULTS, RECOMMEND RECOLLECT TO VERIFY 96.7 98.0   < > 95.5 94.7 96.2  --   PLT QUESTIONABLE RESULTS, RECOMMEND RECOLLECT TO VERIFY 227 195   < > 220 229 262 339   < > = values in this interval not displayed.   Lab Results  Component Value Date   TSH 2.71 04/22/2017   Lab Results  Component Value Date   HGBA1C 6.0 12/19/2015   Lab Results  Component Value Date   CHOL 238 (A) 04/01/2016   HDL 44 04/01/2016   LDLCALC 153 04/01/2016   TRIG 200 (A) 04/01/2016    Assessment/Plan 1. Vascular dementia with behavior disturbance -end stages with minimal activity, mostly bed bound (every time I've seen him but once since  admission), minimally verbal, adl dependent, had a period of significant weight loss--suspect he'd qualify for hospice care if family is agreeable  2. Dysphagia, unspecified type -cont pureed diet as long as weight does not decline further with this  3. Failure to thrive in adult -weight loss and decline due to end stage dementia and dysphagia  4. Acquired hypothyroidism -cont current levothyroxine therapy Lab Results  Component Value Date   TSH 2.71 04/22/2017   5. Constipation -d/c colace due to frequent holds for loose stools  Family/ staff Communication: discussed with memory care dayshift nurse  Labs/tests ordered:  No new  Ammie Warrick L. Madline Oesterling, D.O. Geriatrics [facility] [address], [place] [address] Cell Phone (Mon-Fri 8am-5pm):  [phone] On Call:  [phone] & follow prompts after 5pm & weekends Office Phone:  [phone] Office Fax:  [phone]

## 2017-10-19 ENCOUNTER — Encounter: Payer: Self-pay | Admitting: Adult Health

## 2017-10-19 ENCOUNTER — Non-Acute Institutional Stay (SKILLED_NURSING_FACILITY): Payer: Medicare Other | Admitting: Adult Health

## 2017-10-19 DIAGNOSIS — R627 Adult failure to thrive: Secondary | ICD-10-CM

## 2017-10-19 DIAGNOSIS — I10 Essential (primary) hypertension: Secondary | ICD-10-CM

## 2017-10-19 DIAGNOSIS — F0151 Vascular dementia with behavioral disturbance: Secondary | ICD-10-CM | POA: Diagnosis not present

## 2017-10-19 DIAGNOSIS — R1310 Dysphagia, unspecified: Secondary | ICD-10-CM | POA: Diagnosis not present

## 2017-10-19 DIAGNOSIS — E039 Hypothyroidism, unspecified: Secondary | ICD-10-CM

## 2017-10-19 DIAGNOSIS — F01518 Vascular dementia, unspecified severity, with other behavioral disturbance: Secondary | ICD-10-CM

## 2017-10-19 DIAGNOSIS — D638 Anemia in other chronic diseases classified elsewhere: Secondary | ICD-10-CM

## 2017-10-19 NOTE — Progress Notes (Signed)
Location:  Medical illustrator of Service:  SNF (31) Provider:   Peggye Ley, ANP [facility] [phone]   Kermit Balo, DO  Patient Care Team: Kermit Balo, DO as PCP - General (Geriatric Medicine) Samson Frederic, MD as Consulting Physician (Orthopedic Surgery)  Extended Emergency Contact Information Primary Emergency Contact: Lambeth,Judith Address: [address]          [place], [place] [address] [place] of [place] Home Phone: [phone] Mobile Phone: [phone] Relation: Relative Secondary Emergency Contact: Curley Spice [place] of [place] Home Phone: [phone] Relation: Niece  Code Status:  DNR Goals of care: Advanced Directive information Advanced Directives 09/15/2017  Does Patient Have a Medical Advance Directive? Yes  Type of Estate agent of [place];Living will;Out of facility DNR (pink MOST or yellow form)  Does patient want to make changes to medical advance directive? No - Patient declined  Copy of Healthcare Power of Attorney in Chart? Yes  Would patient like information on creating a medical advance directive? -  Pre-existing out of facility DNR order (yellow form or pink MOST form) Yellow form placed in chart (order not valid for inpatient use);Pink MOST form placed in chart (order not valid for inpatient use)     Chief Complaint  Patient presents with  . Medical Management of Chronic Issues    HPI:  Pt is a 84 y.o. male seen today for medical management of chronic diseases. He resides in memory care due to severe vascular dementia.  He is end stage and spends most of the day with his eyes closed in the wheelchair. The nurse reports they have been holding the ativan he is on for agitation due to lethargy. He occasionally still has times where he is aggressive but since he has become more lethargic this is less of a problem. He continues to cough with meals even on a puree diet  with NTL. His goals of care are comfort based with no treatment for an infection unless his comfort is affected. He had a low grade temp of 99-100 over the past two days but was 98.1 as of today.   08/07/17 Dep 25  Past Medical History:  Diagnosis Date  . Anemia   . Benign positional vertigo   . Bilateral carpal tunnel syndrome   . BPH (benign prostatic hyperplasia)   . Chest pain    noncardiac  . Chronic anal fissure   . Chronic low back pain   . Dementia   . Disease    perrone  . Diverticulosis   . DJD (degenerative joint disease)   . Elevated blood sugar   . GERD (gastroesophageal reflux disease)   . Gilbert's syndrome   . H/O thyroidectomy   . Heart murmur   . Hypercholesteremia   . Hypertension    labils  . Hyperthyroidism   . Iron deficiency   . Lichen planus   . Peripheral neuropathy   . Polymyalgia rheumatica (HCC)    Past Surgical History:  Procedure Laterality Date  . APPENDECTOMY    . bottom teeth removed with implants    . hemrrhoidectomy    . left and right cataract    . left lens implant    . THYROIDECTOMY, PARTIAL    . TONSILLECTOMY    . TOTAL HIP ARTHROPLASTY Left 12/30/2016   Procedure: LEFT HIP HEMIARTHROPLASTY  ANTERIOR APPROACH;  Surgeon: Samson Frederic, MD;  Location: [facility];  Service: Orthopedics;  Laterality: Left;  No Known Allergies  Outpatient Encounter Medications as of 10/19/2017  Medication Sig  . aspirin EC 81 MG tablet Take 81 mg by mouth daily.  . divalproex (DEPAKOTE SPRINKLE) 125 MG capsule Take 250 mg by mouth 3 (three) times daily.  Marland Kitchen docusate (COLACE) 50 MG/5ML liquid Take 100 mg by mouth 2 (two) times daily.   Marland Kitchen lactose free nutrition (BOOST) LIQD Take 237 mLs by mouth 2 (two) times daily between meals.  Marland Kitchen levothyroxine (SYNTHROID, LEVOTHROID) 88 MCG tablet Take 88 mcg by mouth daily before breakfast.  . LORazepam (ATIVAN) 0.5 MG tablet Take 1 tablet (0.5 mg total) by mouth 2 (two) times daily.  . metoprolol tartrate  (LOPRESSOR) 25 MG tablet Take 12.5 mg by mouth 2 (two) times daily.  . mirtazapine (REMERON) 30 MG tablet Take 30 mg by mouth at bedtime.  . risperiDONE (RISPERDAL) 1 MG tablet Take 1 mg by mouth at bedtime.   No facility-administered encounter medications on file as of 10/19/2017.     Review of Systems  Unable to perform ROS: Dementia    Immunization History  Administered Date(s) Administered  . Influenza-Unspecified 12/11/2010, 11/18/2011, 11/25/2012, 11/15/2014, 12/19/2015, 11/26/2016  . Pneumococcal Conjugate-13 12/05/2014  . Pneumococcal Polysaccharide-23 08/02/2005  . Td 12/05/2014  . Tdap 08/02/2004  . Zoster 08/03/2006   Pertinent  Health Maintenance Due  Topic Date Due  . INFLUENZA VACCINE  09/10/2017  . PNA vac Low Risk Adult  Completed   Fall Risk  04/28/2017  Falls in the past year? Yes  Number falls in past yr: 2 or more  Injury with Fall? Yes   Functional Status Survey:    Vitals:   10/19/17 1102  Temp: 98.1 F (36.7 C)  Weight: 122 lb 6.4 oz (55.5 kg)   Body mass index is 19.17 kg/m. Physical Exam  Constitutional: No distress.  Thin and frail  HENT:  Head: Normocephalic and atraumatic.  Nose: Nose normal.  Mouth/Throat: No oropharyngeal exudate.  Eyes:  Would not allow for eye exam  Neck: Normal range of motion. Neck supple. No JVD present. No tracheal deviation present. No thyromegaly present.  Cardiovascular: Normal rate and regular rhythm.  Murmur heard. Pulmonary/Chest: Effort normal and breath sounds normal. No respiratory distress. He has no wheezes.  Abdominal: Soft. Bowel sounds are normal. He exhibits no distension. There is no tenderness.  Lymphadenopathy:    He has no cervical adenopathy.  Neurological:  Sleepy, will not open his eyes or follow commands.   Skin: Skin is warm and dry. He is not diaphoretic.  Psychiatric: He has a normal mood and affect.  Nursing note and vitals reviewed.   Labs reviewed: Recent Labs     12/31/16 0602  01/06/17 0555 01/08/17 0603 01/09/17 0552 03/18/17 0400  NA 138   < > 144 140 137 139  K 3.8   < > 3.4* 3.7 3.8 4.5  CL 107   < > 110 107 106  --   CO2 27   < > 27 27 25   --   GLUCOSE 102*   < > 144* 96 94  --   BUN 32*   < > 23* 18 17 17   CREATININE 0.91   < > 0.79 0.84 0.78 0.7  CALCIUM 8.1*   < > 8.8* 8.7* 8.5*  --   MG 2.0  --   --   --   --   --    < > = values in this interval not displayed.  Recent Labs    03/18/17 0400  AST 16  ALT 9*  ALKPHOS 117   Recent Labs    12/30/16 0611 12/30/16 0901 12/31/16 0602  01/02/17 0851 01/03/17 0624 01/05/17 0512 03/18/17 0400  WBC QUESTIONABLE RESULTS, RECOMMEND RECOLLECT TO VERIFY 13.8* 9.9   < > 6.7 6.4 5.4 5.8  NEUTROABS QUESTIONABLE RESULTS, RECOMMEND RECOLLECT TO VERIFY 12.5* 8.3*  --   --   --   --   --   HGB QUESTIONABLE RESULTS, RECOMMEND RECOLLECT TO VERIFY 10.6* 9.5*   < > 10.6* 10.3* 9.2* 10.0*  HCT QUESTIONABLE RESULTS, RECOMMEND RECOLLECT TO VERIFY 32.1* 28.9*   < > 32.0* 30.6* 27.5* 31*  MCV QUESTIONABLE RESULTS, RECOMMEND RECOLLECT TO VERIFY 96.7 98.0   < > 95.5 94.7 96.2  --   PLT QUESTIONABLE RESULTS, RECOMMEND RECOLLECT TO VERIFY 227 195   < > 220 229 262 339   < > = values in this interval not displayed.   Lab Results  Component Value Date   TSH 2.71 04/22/2017   Lab Results  Component Value Date   HGBA1C 6.0 12/19/2015   Lab Results  Component Value Date   CHOL 238 (A) 04/01/2016   HDL 44 04/01/2016   LDLCALC 153 04/01/2016   TRIG 200 (A) 04/01/2016    Significant Diagnostic Results in last 30 days:  No results found.  Assessment/Plan  1. Dysphagia, unspecified type Worsening with risk for aspiration pneumonia Temp has normalized and lung sounds are normal I have discussed this situation with his POA and we agreed not to treat him for pneumonia. He will only receive antibiotics if his comfort is affected.  Continue puree diet with NTL 1:1 supervision  2. Vascular  dementia with behavior disturbance End stage Reduce ativan to 0.25 mg bid due to lethargy (being held by nsg staff) Dr Donell Beers to see him to evaluate his current regimen  3. Anemia of chronic disease No longer monitoring CBC Lab Results  Component Value Date   HGB 10.0 (A) 03/18/2017     4. Failure to thrive in adult Weight loss, agitation, lack of participation in activities or conversation with others.  Continue nutritional supplements  5. Acquired hypothyroidism Continue synthroid  Lab Results  Component Value Date   TSH 2.71 04/22/2017     6. Essential hypertension Controlled, continue lopressor 12.5 mg bid   Family/ staff Communication: discussed with nurse  Labs/tests ordered:  NA due to goals of care unless needed for drug therapy monitoring

## 2017-10-26 ENCOUNTER — Non-Acute Institutional Stay (SKILLED_NURSING_FACILITY): Payer: Medicare Other | Admitting: Adult Health

## 2017-10-26 ENCOUNTER — Encounter: Payer: Self-pay | Admitting: Adult Health

## 2017-10-26 DIAGNOSIS — M25471 Effusion, right ankle: Secondary | ICD-10-CM

## 2017-10-26 NOTE — Progress Notes (Signed)
Location:  Medical illustrator of Service:  SNF (31) Provider:   Peggye Ley, ANP [facility] [phone]   Kermit Balo, DO  Patient Care Team: Kermit Balo, DO as PCP - General (Geriatric Medicine) Samson Frederic, MD as Consulting Physician (Orthopedic Surgery)  Extended Emergency Contact Information Primary Emergency Contact: Lambeth,Judith Address: [address]          [place], [place] [address] [place] of [place] Home Phone: [phone] Mobile Phone: [phone] Relation: Relative Secondary Emergency Contact: Curley Spice [place] Home Phone: [phone] Relation: Niece  Code Status:  DNR Goals of care: Advanced Directive information Advanced Directives 09/15/2017  Does Patient Have a Medical Advance Directive? Yes  Type of Estate agent of [place];Living will;Out of facility DNR (pink MOST or yellow form)  Does patient want to make changes to medical advance directive? No - Patient declined  Copy of Healthcare Power of Attorney in Chart? Yes  Would patient like information on creating a medical advance directive? -  Pre-existing out of facility DNR order (yellow form or pink MOST form) Yellow form placed in chart (order not valid for inpatient use);Pink MOST form placed in chart (order not valid for inpatient use)     Chief Complaint  Patient presents with  . Acute Visit    right ankle swelling    HPI:  Pt is a 84 y.o. male seen today for an acute visit for right ankle swelling, first noted 4-5 days ago per the nurse. The resident has severe dementia and can not provide a history. There is no report of fall or injury.He is minimally ambulatory and spends most of the time in the bed or chair. No fever, bruising, pain, warmth, or erythema noted.    Past Medical History:  Diagnosis Date  . Anemia   . Benign positional vertigo   . Bilateral carpal tunnel syndrome   . BPH  (benign prostatic hyperplasia)   . Chest pain    noncardiac  . Chronic anal fissure   . Chronic low back pain   . Dementia   . Disease    perrone  . Diverticulosis   . DJD (degenerative joint disease)   . Elevated blood sugar   . GERD (gastroesophageal reflux disease)   . Gilbert's syndrome   . H/O thyroidectomy   . Heart murmur   . Hypercholesteremia   . Hypertension    labils  . Hyperthyroidism   . Iron deficiency   . Lichen planus   . Peripheral neuropathy   . Polymyalgia rheumatica (HCC)    Past Surgical History:  Procedure Laterality Date  . APPENDECTOMY    . bottom teeth removed with implants    . hemrrhoidectomy    . left and right cataract    . left lens implant    . THYROIDECTOMY, PARTIAL    . TONSILLECTOMY    . TOTAL HIP ARTHROPLASTY Left 12/30/2016   Procedure: LEFT HIP HEMIARTHROPLASTY  ANTERIOR APPROACH;  Surgeon: Samson Frederic, MD;  Location: [facility];  Service: Orthopedics;  Laterality: Left;    No Known Allergies  Outpatient Encounter Medications as of 10/26/2017  Medication Sig  . aspirin EC 81 MG tablet Take 81 mg by mouth daily.  . divalproex (DEPAKOTE SPRINKLE) 125 MG capsule Take 250 mg by mouth 3 (three) times daily.  Marland Kitchen docusate (COLACE) 50 MG/5ML liquid Take 100 mg by mouth 2 (two) times daily.   Marland Kitchen lactose free nutrition (  BOOST) LIQD Take 237 mLs by mouth 2 (two) times daily between meals.  Marland Kitchen levothyroxine (SYNTHROID, LEVOTHROID) 88 MCG tablet Take 88 mcg by mouth daily before breakfast.  . LORazepam (ATIVAN) 0.5 MG tablet Take 1 tablet (0.5 mg total) by mouth 2 (two) times daily.  . metoprolol tartrate (LOPRESSOR) 25 MG tablet Take 12.5 mg by mouth 2 (two) times daily.  . mirtazapine (REMERON) 30 MG tablet Take 30 mg by mouth at bedtime.  . risperiDONE (RISPERDAL) 1 MG tablet Take 1 mg by mouth at bedtime.   No facility-administered encounter medications on file as of 10/26/2017.     Review of Systems  Unable to perform ROS: Dementia     Immunization History  Administered Date(s) Administered  . Influenza-Unspecified 12/11/2010, 11/18/2011, 11/25/2012, 11/15/2014, 12/19/2015, 11/26/2016  . Pneumococcal Conjugate-13 12/05/2014  . Pneumococcal Polysaccharide-23 08/02/2005  . Td 12/05/2014  . Tdap 08/02/2004  . Zoster 08/03/2006   Pertinent  Health Maintenance Due  Topic Date Due  . INFLUENZA VACCINE  09/10/2017  . PNA vac Low Risk Adult  Completed   Fall Risk  04/28/2017  Falls in the past year? Yes  Number falls in past yr: 2 or more  Injury with Fall? Yes   Functional Status Survey:    Vitals:   10/26/17 1413  Pulse: 73  Resp: 17  Temp: 99.5 F (37.5 C)  SpO2: 94%   There is no height or weight on file to calculate BMI. Physical Exam  Constitutional: No distress.  Musculoskeletal: He exhibits edema.       Right ankle: He exhibits swelling. He exhibits no ecchymosis, no deformity, no laceration and normal pulse. Tenderness. No CF ligament tenderness found.  General swelling to the right pedal and ankle area without redness or warmth or bruising. Mild joint laxity noted on the lateral right ankle  Skin: Skin is warm and dry. No rash noted. He is not diaphoretic. No erythema.    Labs reviewed: Recent Labs    12/31/16 0602  01/06/17 0555 01/08/17 0603 01/09/17 0552 03/18/17 0400  NA 138   < > 144 140 137 139  K 3.8   < > 3.4* 3.7 3.8 4.5  CL 107   < > 110 107 106  --   CO2 27   < > 27 27 25   --   GLUCOSE 102*   < > 144* 96 94  --   BUN 32*   < > 23* 18 17 17   CREATININE 0.91   < > 0.79 0.84 0.78 0.7  CALCIUM 8.1*   < > 8.8* 8.7* 8.5*  --   MG 2.0  --   --   --   --   --    < > = values in this interval not displayed.   Recent Labs    03/18/17 0400  AST 16  ALT 9*  ALKPHOS 117   Recent Labs    12/30/16 0611 12/30/16 0901 12/31/16 0602  01/02/17 0851 01/03/17 0624 01/05/17 0512 03/18/17 0400  WBC QUESTIONABLE RESULTS, RECOMMEND RECOLLECT TO VERIFY 13.8* 9.9   < > 6.7 6.4 5.4  5.8  NEUTROABS QUESTIONABLE RESULTS, RECOMMEND RECOLLECT TO VERIFY 12.5* 8.3*  --   --   --   --   --   HGB QUESTIONABLE RESULTS, RECOMMEND RECOLLECT TO VERIFY 10.6* 9.5*   < > 10.6* 10.3* 9.2* 10.0*  HCT QUESTIONABLE RESULTS, RECOMMEND RECOLLECT TO VERIFY 32.1* 28.9*   < > 32.0* 30.6* 27.5* 31*  MCV  QUESTIONABLE RESULTS, RECOMMEND RECOLLECT TO VERIFY 96.7 98.0   < > 95.5 94.7 96.2  --   PLT QUESTIONABLE RESULTS, RECOMMEND RECOLLECT TO VERIFY 227 195   < > 220 229 262 339   < > = values in this interval not displayed.   Lab Results  Component Value Date   TSH 2.71 04/22/2017   Lab Results  Component Value Date   HGBA1C 6.0 12/19/2015   Lab Results  Component Value Date   CHOL 238 (A) 04/01/2016   HDL 44 04/01/2016   LDLCALC 153 04/01/2016   TRIG 200 (A) 04/01/2016    Significant Diagnostic Results in last 30 days:  No results found.  Assessment/Plan  1. Right ankle swelling Check 2 view xray to rule out fracture Due to the lack of history it is difficult to diagnose this issue. He does have some ligament laxity and therefore will order an ace wrap. He has advanced dementia with behaviors and would not be appropriate for aggressive care. May use ice tid prn swelling    Family/ staff Communication: discussed with nurse  Labs/tests ordered:  Xray right ankle

## 2017-11-12 ENCOUNTER — Non-Acute Institutional Stay (SKILLED_NURSING_FACILITY): Payer: Medicare Other | Admitting: Adult Health

## 2017-11-12 DIAGNOSIS — F0151 Vascular dementia with behavioral disturbance: Secondary | ICD-10-CM

## 2017-11-12 DIAGNOSIS — F01518 Vascular dementia, unspecified severity, with other behavioral disturbance: Secondary | ICD-10-CM

## 2017-11-12 DIAGNOSIS — R1310 Dysphagia, unspecified: Secondary | ICD-10-CM

## 2017-11-12 DIAGNOSIS — F329 Major depressive disorder, single episode, unspecified: Secondary | ICD-10-CM | POA: Diagnosis not present

## 2017-11-12 DIAGNOSIS — R627 Adult failure to thrive: Secondary | ICD-10-CM | POA: Diagnosis not present

## 2017-11-12 DIAGNOSIS — R634 Abnormal weight loss: Secondary | ICD-10-CM

## 2017-11-12 DIAGNOSIS — E890 Postprocedural hypothyroidism: Secondary | ICD-10-CM

## 2017-11-12 DIAGNOSIS — R600 Localized edema: Secondary | ICD-10-CM

## 2017-11-15 ENCOUNTER — Encounter: Payer: Self-pay | Admitting: Adult Health

## 2017-11-15 NOTE — Progress Notes (Signed)
Location:  Medical illustrator of Service:  SNF (31) Provider:   Peggye Ley, ANP [facility] [phone]   Kermit Balo, DO  Patient Care Team: Kermit Balo, DO as PCP - General (Geriatric Medicine) Samson Frederic, MD as Consulting Physician (Orthopedic Surgery)  Extended Emergency Contact Information Primary Emergency Contact: Lambeth,Judith Address: [address]          [place], [place] [address] [place] of [place] Home Phone: [phone] Mobile Phone: [phone] Relation: Relative Secondary Emergency Contact: Curley Spice [place] Home Phone: [phone] Relation: Niece  Code Status:  DNR Goals of care: Advanced Directive information Advanced Directives 09/15/2017  Does Patient Have a Medical Advance Directive? Yes  Type of Estate agent of [place];Living will;Out of facility DNR (pink MOST or yellow form)  Does patient want to make changes to medical advance directive? No - Patient declined  Copy of Healthcare Power of Attorney in Chart? Yes  Would patient like information on creating a medical advance directive? -  Pre-existing out of facility DNR order (yellow form or pink MOST form) Yellow form placed in chart (order not valid for inpatient use);Pink MOST form placed in chart (order not valid for inpatient use)     Chief Complaint  Patient presents with  . Medical Management of Chronic Issues    HPI:  Pt is a 84 y.o. male seen today for medical management of chronic diseases.    Vascular dementia: he resides in the memory care unit and has periods of lethargy and periods of agitation. He is minimally verbal and needs assistance with all ADLs. He no longer is ambulatory but can stand and pivot to a chair and intermittently follow commands to do so. His goals of care are comfort based with no hospitalizations.  He can be combative during personal care and is on depakote and  risperdal. Followed by Dr. Donell Beers.  Dysphagia: staff report coughing with meals at times. Currently on a puree diet with NTL.   Hypothyroidism: Lab Results  Component Value Date   TSH 2.71 04/22/2017    Anemia of chronic disease: Lab Results  Component Value Date   HGB 10.0 (A) 03/18/2017    Weight loss: currently on boost twice daily and remeron 30 mg qhs Intermittently skips meals Wt Readings from Last 3 Encounters:  11/11/17 120 lb 12.8 oz (54.8 kg)  10/19/17 122 lb 6.4 oz (55.5 kg)  09/15/17 118 lb (53.5 kg)   localized edema to right foot with negative foot xray for fracture in Sept 2019  Past Medical History:  Diagnosis Date  . Anemia   . Benign positional vertigo   . Bilateral carpal tunnel syndrome   . BPH (benign prostatic hyperplasia)   . Chest pain    noncardiac  . Chronic anal fissure   . Chronic low back pain   . Dementia (HCC)   . Disease    perrone  . Diverticulosis   . DJD (degenerative joint disease)   . Elevated blood sugar   . GERD (gastroesophageal reflux disease)   . Gilbert's syndrome   . H/O thyroidectomy   . Heart murmur   . Hypercholesteremia   . Hypertension    labils  . Hyperthyroidism   . Iron deficiency   . Lichen planus   . Peripheral neuropathy   . Polymyalgia rheumatica (HCC)    Past Surgical History:  Procedure Laterality Date  . APPENDECTOMY    . bottom teeth  removed with implants    . hemrrhoidectomy    . left and right cataract    . left lens implant    . THYROIDECTOMY, PARTIAL    . TONSILLECTOMY    . TOTAL HIP ARTHROPLASTY Left 12/30/2016   Procedure: LEFT HIP HEMIARTHROPLASTY  ANTERIOR APPROACH;  Surgeon: Samson Frederic, MD;  Location: [facility];  Service: Orthopedics;  Laterality: Left;    No Known Allergies  Outpatient Encounter Medications as of 11/12/2017  Medication Sig  . aspirin EC 81 MG tablet Take 81 mg by mouth daily.  . divalproex (DEPAKOTE SPRINKLE) 125 MG capsule Take 250 mg by mouth 3 (three)  times daily.  Marland Kitchen lactose free nutrition (BOOST) LIQD Take 237 mLs by mouth 2 (two) times daily between meals.  Marland Kitchen levothyroxine (SYNTHROID, LEVOTHROID) 88 MCG tablet Take 88 mcg by mouth daily before breakfast.  . LORazepam (ATIVAN) 0.5 MG tablet Take 1 tablet (0.5 mg total) by mouth 2 (two) times daily.  . metoprolol tartrate (LOPRESSOR) 25 MG tablet Take 12.5 mg by mouth 2 (two) times daily.  . mirtazapine (REMERON) 30 MG tablet Take 30 mg by mouth at bedtime.  . risperiDONE (RISPERDAL) 1 MG tablet Take 1 mg by mouth at bedtime.  . senna-docusate (SENOKOT-S) 8.6-50 MG tablet Take 2 tablets by mouth at bedtime as needed for mild constipation.  . [DISCONTINUED] docusate (COLACE) 50 MG/5ML liquid Take 100 mg by mouth 2 (two) times daily.    No facility-administered encounter medications on file as of 11/12/2017.     Review of Systems  Unable to perform ROS: Dementia    Immunization History  Administered Date(s) Administered  . Influenza-Unspecified 12/11/2010, 11/18/2011, 11/25/2012, 11/15/2014, 12/19/2015, 11/26/2016  . Pneumococcal Conjugate-13 12/05/2014  . Pneumococcal Polysaccharide-23 08/02/2005  . Td 12/05/2014  . Tdap 08/02/2004  . Zoster 08/03/2006   Pertinent  Health Maintenance Due  Topic Date Due  . INFLUENZA VACCINE  09/10/2017  . PNA vac Low Risk Adult  Completed   Fall Risk  04/28/2017  Falls in the past year? Yes  Number falls in past yr: 2 or more  Injury with Fall? Yes   Functional Status Survey:    Vitals:   11/11/17 0727  BP: 121/73  Pulse: 69  Resp: 19  Temp: (!) 97.2 F (36.2 C)  SpO2: 93%  Weight: 120 lb 12.8 oz (54.8 kg)   Body mass index is 18.92 kg/m. Physical Exam  Constitutional: No distress.  HENT:  Head: Normocephalic and atraumatic.  Nose: Nose normal.  Mouth/Throat: No oropharyngeal exudate.  Neck: No tracheal deviation present.  Cardiovascular: Normal rate and regular rhythm.  Pulmonary/Chest: Effort normal and breath sounds  normal.  Abdominal: Soft. Bowel sounds are normal.  Musculoskeletal: He exhibits edema (right pedal area). He exhibits no tenderness or deformity.  Lymphadenopathy:    He has no cervical adenopathy.  Neurological: He is alert.  Oriented to self only, not able to f/c  Skin: Skin is warm and dry. He is not diaphoretic.  Nursing note and vitals reviewed.   Labs reviewed: Recent Labs    12/31/16 0602  01/06/17 0555 01/08/17 0603 01/09/17 0552 03/18/17 0400  NA 138   < > 144 140 137 139  K 3.8   < > 3.4* 3.7 3.8 4.5  CL 107   < > 110 107 106  --   CO2 27   < > 27 27 25   --   GLUCOSE 102*   < > 144* 96 94  --  BUN 32*   < > 23* 18 17 17   CREATININE 0.91   < > 0.79 0.84 0.78 0.7  CALCIUM 8.1*   < > 8.8* 8.7* 8.5*  --   MG 2.0  --   --   --   --   --    < > = values in this interval not displayed.   Recent Labs    03/18/17 0400  AST 16  ALT 9*  ALKPHOS 117   Recent Labs    12/30/16 0611 12/30/16 0901 12/31/16 0602  01/02/17 0851 01/03/17 0624 01/05/17 0512 03/18/17 0400  WBC QUESTIONABLE RESULTS, RECOMMEND RECOLLECT TO VERIFY 13.8* 9.9   < > 6.7 6.4 5.4 5.8  NEUTROABS QUESTIONABLE RESULTS, RECOMMEND RECOLLECT TO VERIFY 12.5* 8.3*  --   --   --   --   --   HGB QUESTIONABLE RESULTS, RECOMMEND RECOLLECT TO VERIFY 10.6* 9.5*   < > 10.6* 10.3* 9.2* 10.0*  HCT QUESTIONABLE RESULTS, RECOMMEND RECOLLECT TO VERIFY 32.1* 28.9*   < > 32.0* 30.6* 27.5* 31*  MCV QUESTIONABLE RESULTS, RECOMMEND RECOLLECT TO VERIFY 96.7 98.0   < > 95.5 94.7 96.2  --   PLT QUESTIONABLE RESULTS, RECOMMEND RECOLLECT TO VERIFY 227 195   < > 220 229 262 339   < > = values in this interval not displayed.   Lab Results  Component Value Date   TSH 2.71 04/22/2017   Lab Results  Component Value Date   HGBA1C 6.0 12/19/2015   Lab Results  Component Value Date   CHOL 238 (A) 04/01/2016   HDL 44 04/01/2016   LDLCALC 153 04/01/2016   TRIG 200 (A) 04/01/2016    Significant Diagnostic Results in last  30 days:  No results found.  Assessment/Plan  1. Vascular dementia with behavior disturbance (HCC) Severe with goals of care comfort based Continue supportive care in the memory unit Continue depakote and risperdal. Monitor depakote level q 6 months. Avoid frequent labs due to combative behavior  2. Dysphagia, unspecified type Continue puree diet with NTL, no feeding tubes  3. Reactive depression Difficult to assess given his advanced dementia.  Continue Remeron 30 mg qhs which likely helps his agitation  Followed by Dr. Donell Beers  4. Failure to thrive in adult Very frail older adult with progressive weight loss, falls, dysphagia, and depression  5. Weight loss Continue Boost BID  6. Postoperative hypothyroidism Continue synthroid 88 mcg qd  7. Localized edema Try compression hose on in the am and off in the pm  Family/ staff Communication: discussed with staff Labs/tests ordered:  NA

## 2017-12-10 ENCOUNTER — Other Ambulatory Visit: Payer: Self-pay | Admitting: Adult Health

## 2017-12-10 DIAGNOSIS — F419 Anxiety disorder, unspecified: Secondary | ICD-10-CM

## 2017-12-10 MED ORDER — LORAZEPAM 0.5 MG PO TABS: 0.2500 mg | ORAL_TABLET | Freq: Two times a day (BID) | ORAL | 1 refills | Status: DC

## 2017-12-17 ENCOUNTER — Non-Acute Institutional Stay (SKILLED_NURSING_FACILITY): Payer: Medicare Other | Admitting: Adult Health

## 2017-12-17 DIAGNOSIS — F0151 Vascular dementia with behavioral disturbance: Secondary | ICD-10-CM | POA: Diagnosis not present

## 2017-12-17 DIAGNOSIS — R634 Abnormal weight loss: Secondary | ICD-10-CM

## 2017-12-17 DIAGNOSIS — R627 Adult failure to thrive: Secondary | ICD-10-CM

## 2017-12-17 DIAGNOSIS — R1312 Dysphagia, oropharyngeal phase: Secondary | ICD-10-CM

## 2017-12-17 DIAGNOSIS — F01518 Vascular dementia, unspecified severity, with other behavioral disturbance: Secondary | ICD-10-CM

## 2017-12-17 DIAGNOSIS — I10 Essential (primary) hypertension: Secondary | ICD-10-CM | POA: Diagnosis not present

## 2017-12-17 NOTE — Progress Notes (Signed)
Location:  Medical illustrator of Service:  SNF (31) Provider:   Peggye Ley, ANP [facility] [phone]   Kermit Balo, DO  Patient Care Team: Kermit Balo, DO as PCP - General (Geriatric Medicine) Samson Frederic, MD as Consulting Physician (Orthopedic Surgery)  Extended Emergency Contact Information Primary Emergency Contact: Lambeth,Judith Address: [address]          [place], [place] [address] [place] of [place] Home Phone: [phone] Mobile Phone: [phone] Relation: Relative Secondary Emergency Contact: Curley Spice [place] of [place] Home Phone: [phone] Relation: Niece  Code Status:  DNR Goals of care: Advanced Directive information Advanced Directives 09/15/2017  Does Patient Have a Medical Advance Directive? Yes  Type of Estate agent of [place];Living will;Out of facility DNR (pink MOST or yellow form)  Does patient want to make changes to medical advance directive? No - Patient declined  Copy of Healthcare Power of Attorney in Chart? Yes  Would patient like information on creating a medical advance directive? -  Pre-existing out of facility DNR order (yellow form or pink MOST form) Yellow form placed in chart (order not valid for inpatient use);Pink MOST form placed in chart (order not valid for inpatient use)     Chief Complaint  Patient presents with  . Medical Management of Chronic Issues    HPI:  Pt is a 84 y.o. male seen today for medical management of chronic diseases.  He has severe vascular dementia and resides in the memory care unit. He has continued to decline with worsening weakness and lethargy. He is eating less and has lost 5 lbs in the past two months, down to 117 lbs. The nurse reports that he had daily low grade temp  11/6-11/8 but now is 98.8.  He has a congested cough but no sputum production or decreased 02 sats. His is on a puree diet with NTL and continues  to cough at meals as well. Overall his behavioral issues have improved per staff with less combativeness.    Past Medical History:  Diagnosis Date  . Anemia   . Benign positional vertigo   . Bilateral carpal tunnel syndrome   . BPH (benign prostatic hyperplasia)   . Chest pain    noncardiac  . Chronic anal fissure   . Chronic low back pain   . Dementia (HCC)   . Disease    perrone  . Diverticulosis   . DJD (degenerative joint disease)   . Elevated blood sugar   . GERD (gastroesophageal reflux disease)   . Gilbert's syndrome   . H/O thyroidectomy   . Heart murmur   . Hypercholesteremia   . Hypertension    labils  . Hyperthyroidism   . Iron deficiency   . Lichen planus   . Peripheral neuropathy   . Polymyalgia rheumatica (HCC)    Past Surgical History:  Procedure Laterality Date  . APPENDECTOMY    . bottom teeth removed with implants    . hemrrhoidectomy    . left and right cataract    . left lens implant    . THYROIDECTOMY, PARTIAL    . TONSILLECTOMY    . TOTAL HIP ARTHROPLASTY Left 12/30/2016   Procedure: LEFT HIP HEMIARTHROPLASTY  ANTERIOR APPROACH;  Surgeon: Samson Frederic, MD;  Location: [facility];  Service: Orthopedics;  Laterality: Left;    No Known Allergies  Outpatient Encounter Medications as of 12/17/2017  Medication Sig  . aspirin EC 81 MG tablet  Take 81 mg by mouth daily.  . divalproex (DEPAKOTE SPRINKLE) 125 MG capsule Take 250 mg by mouth 3 (three) times daily.  Marland Kitchen lactose free nutrition (BOOST) LIQD Take 237 mLs by mouth 2 (two) times daily between meals.  Marland Kitchen levothyroxine (SYNTHROID, LEVOTHROID) 88 MCG tablet Take 88 mcg by mouth daily before breakfast.  . LORazepam (ATIVAN) 0.5 MG tablet Take 0.5 tablets (0.25 mg total) by mouth 2 (two) times daily.  . metoprolol tartrate (LOPRESSOR) 25 MG tablet Take 12.5 mg by mouth 2 (two) times daily.  . mirtazapine (REMERON) 30 MG tablet Take 30 mg by mouth at bedtime.  . risperiDONE (RISPERDAL) 1 MG tablet Take  1 mg by mouth at bedtime.  . senna-docusate (SENOKOT-S) 8.6-50 MG tablet Take 2 tablets by mouth at bedtime as needed for mild constipation.   No facility-administered encounter medications on file as of 12/17/2017.     Review of Systems  Unable to perform ROS: Dementia    Immunization History  Administered Date(s) Administered  . Influenza, High Dose Seasonal PF 12/07/2016  . Influenza,inj,Quad PF,6+ Mos 12/01/2017  . Influenza-Unspecified 12/11/2010, 11/18/2011, 11/25/2012, 11/15/2014, 12/19/2015  . Pneumococcal Conjugate-13 12/05/2014  . Pneumococcal Polysaccharide-23 08/02/2005  . Td 12/05/2014  . Tdap 08/02/2004  . Zoster 08/03/2006   Pertinent  Health Maintenance Due  Topic Date Due  . INFLUENZA VACCINE  Completed  . PNA vac Low Risk Adult  Completed   Fall Risk  04/28/2017  Falls in the past year? Yes  Number falls in past yr: 2 or more  Injury with Fall? Yes   Functional Status Survey:    Vitals:   12/21/17 1633  BP: (!) 151/76  Pulse: 68  Resp: 16  Temp: 98.8 F (37.1 C)  SpO2: 95%  Weight: 117 lb 9.6 oz (53.3 kg)   Body mass index is 18.42 kg/m.  Wt Readings from Last 3 Encounters:  12/21/17 117 lb 9.6 oz (53.3 kg)  11/11/17 120 lb 12.8 oz (54.8 kg)  10/19/17 122 lb 6.4 oz (55.5 kg)   Physical Exam  Constitutional: No distress.  Thin and frail male  HENT:  Head: Normocephalic and atraumatic.  Nose: Nose normal.  Refused oral exam  Neck: No JVD present. No tracheal deviation present. No thyromegaly present.  Cardiovascular: Normal rate and regular rhythm.  No murmur heard. Pulmonary/Chest: Effort normal and breath sounds normal. No respiratory distress. He has no wheezes.  Abdominal: Soft. Bowel sounds are normal. He exhibits no distension. There is no tenderness.  Lymphadenopathy:    He has no cervical adenopathy.  Neurological: He is alert.  Eyes are closed, arouses to physical stim but not verbal and can not f/c  Skin: Skin is warm and dry.  He is not diaphoretic.  Psychiatric: He has a normal mood and affect.  Nursing note and vitals reviewed.   Labs reviewed: Recent Labs    12/31/16 0602  01/06/17 0555 01/08/17 0603 01/09/17 0552 03/18/17 0400  NA 138   < > 144 140 137 139  K 3.8   < > 3.4* 3.7 3.8 4.5  CL 107   < > 110 107 106  --   CO2 27   < > 27 27 25   --   GLUCOSE 102*   < > 144* 96 94  --   BUN 32*   < > 23* 18 17 17   CREATININE 0.91   < > 0.79 0.84 0.78 0.7  CALCIUM 8.1*   < > 8.8* 8.7* 8.5*  --  MG 2.0  --   --   --   --   --    < > = values in this interval not displayed.   Recent Labs    03/18/17 0400  AST 16  ALT 9*  ALKPHOS 117   Recent Labs    12/30/16 0611 12/30/16 0901 12/31/16 0602  01/02/17 0851 01/03/17 0624 01/05/17 0512 03/18/17 0400  WBC QUESTIONABLE RESULTS, RECOMMEND RECOLLECT TO VERIFY 13.8* 9.9   < > 6.7 6.4 5.4 5.8  NEUTROABS QUESTIONABLE RESULTS, RECOMMEND RECOLLECT TO VERIFY 12.5* 8.3*  --   --   --   --   --   HGB QUESTIONABLE RESULTS, RECOMMEND RECOLLECT TO VERIFY 10.6* 9.5*   < > 10.6* 10.3* 9.2* 10.0*  HCT QUESTIONABLE RESULTS, RECOMMEND RECOLLECT TO VERIFY 32.1* 28.9*   < > 32.0* 30.6* 27.5* 31*  MCV QUESTIONABLE RESULTS, RECOMMEND RECOLLECT TO VERIFY 96.7 98.0   < > 95.5 94.7 96.2  --   PLT QUESTIONABLE RESULTS, RECOMMEND RECOLLECT TO VERIFY 227 195   < > 220 229 262 339   < > = values in this interval not displayed.   Lab Results  Component Value Date   TSH 2.71 04/22/2017   Lab Results  Component Value Date   HGBA1C 6.0 12/19/2015   Lab Results  Component Value Date   CHOL 238 (A) 04/01/2016   HDL 44 04/01/2016   LDLCALC 153 04/01/2016   TRIG 200 (A) 04/01/2016    Significant Diagnostic Results in last 30 days:  No results found.  Assessment/Plan  1. Vascular dementia with behavior disturbance (HCC) Less behaviors, more lethargy Change ativan to 0.25 mg BID prn due to lethargy Dep level due in Dec, would like to taper in the future if  tolerable Appears to be nearing the end of life  2. Oropharyngeal dysphagia Continues with signs of aspiration despite a modified diet and asp prec His goals of care are comfort based with no antibiotics or hospitalizations  3. Essential hypertension Slightly elevated but would not treat aggressively given his state of health  4. Failure to thrive in adult Progressive weight loss, decreased function, dysphagia, and dementia  5. Weight loss Due to dysphagia and decreased intake Continue snacks when meals are missed and nutritional supplements Remains on remeron but dosed for depression   Family/ staff Communication: staff  Labs/tests ordered:  NA

## 2017-12-21 ENCOUNTER — Encounter: Payer: Self-pay | Admitting: Adult Health

## 2018-01-01 ENCOUNTER — Encounter: Payer: Self-pay | Admitting: Adult Health

## 2018-01-01 ENCOUNTER — Non-Acute Institutional Stay (SKILLED_NURSING_FACILITY): Payer: Medicare Other | Admitting: Adult Health

## 2018-01-01 DIAGNOSIS — R1312 Dysphagia, oropharyngeal phase: Secondary | ICD-10-CM

## 2018-01-01 DIAGNOSIS — J690 Pneumonitis due to inhalation of food and vomit: Secondary | ICD-10-CM

## 2018-01-01 NOTE — Progress Notes (Signed)
Location:  Medical illustrator of Service:  SNF (31) Provider:   Peggye Ley, ANP [facility] [phone]  Kermit Balo, DO  Patient Care Team: Kermit Balo, DO as PCP - General (Geriatric Medicine) Samson Frederic, MD as Consulting Physician (Orthopedic Surgery)  Extended Emergency Contact Information Primary Emergency Contact: Lambeth,Judith Address: [address]          [place], [place] [address] [place] of [place] Home Phone: [phone] Mobile Phone: [phone] Relation: Relative Secondary Emergency Contact: Curley Spice [place] Home Phone: [phone] Relation: Niece  Code Status:  DNR Goals of care: Advanced Directive information Advanced Directives 09/15/2017  Does Patient Have a Medical Advance Directive? Yes  Type of Estate agent of [place];Living will;Out of facility DNR (pink MOST or yellow form)  Does patient want to make changes to medical advance directive? No - Patient declined  Copy of Healthcare Power of Attorney in Chart? Yes  Would patient like information on creating a medical advance directive? -  Pre-existing out of facility DNR order (yellow form or pink MOST form) Yellow form placed in chart (order not valid for inpatient use);Pink MOST form placed in chart (order not valid for inpatient use)     Chief Complaint  Patient presents with  . Acute Visit    cough, fever, lethargy    HPI:  Pt is a 84 y.o. male seen today for fever, cough, lethargy, and oxygen dependency. He has a hx of advanced dementia and is minimally verbal.  The nurse reports that after dinner he began to have a congested cough and his sats dropped into the 80's.  Sats are now 89% on 2L. Temp was 102.9 and a tylenol supp was given. Current temp 98.2.   He was lethargic last evening but became more alert this morning and ate his breakfast and took his pills. He has a hx of dysphagia and is on  puree diet with NTL. This same situation occurred last month but her recovered without treatment as his most form indicates no hospitalizations or antibiotics.  He has not had any respiratory distress.    Past Medical History:  Diagnosis Date  . Anemia   . Benign positional vertigo   . Bilateral carpal tunnel syndrome   . BPH (benign prostatic hyperplasia)   . Chest pain    noncardiac  . Chronic anal fissure   . Chronic low back pain   . Dementia (HCC)   . Disease    perrone  . Diverticulosis   . DJD (degenerative joint disease)   . Elevated blood sugar   . GERD (gastroesophageal reflux disease)   . Gilbert's syndrome   . H/O thyroidectomy   . Heart murmur   . Hypercholesteremia   . Hypertension    labils  . Hyperthyroidism   . Iron deficiency   . Lichen planus   . Peripheral neuropathy   . Polymyalgia rheumatica (HCC)    Past Surgical History:  Procedure Laterality Date  . APPENDECTOMY    . bottom teeth removed with implants    . hemrrhoidectomy    . left and right cataract    . left lens implant    . THYROIDECTOMY, PARTIAL    . TONSILLECTOMY    . TOTAL HIP ARTHROPLASTY Left 12/30/2016   Procedure: LEFT HIP HEMIARTHROPLASTY  ANTERIOR APPROACH;  Surgeon: Samson Frederic, MD;  Location: [facility];  Service: Orthopedics;  Laterality: Left;    No Known  Allergies  Outpatient Encounter Medications as of 01/01/2018  Medication Sig  . aspirin EC 81 MG tablet Take 81 mg by mouth daily.  . divalproex (DEPAKOTE SPRINKLE) 125 MG capsule Take 250 mg by mouth 3 (three) times daily.  Marland Kitchen. lactose free nutrition (BOOST) LIQD Take 237 mLs by mouth 2 (two) times daily between meals.  Marland Kitchen. levothyroxine (SYNTHROID, LEVOTHROID) 88 MCG tablet Take 88 mcg by mouth daily before breakfast.  . LORazepam (ATIVAN) 0.5 MG tablet Take 0.5 tablets (0.25 mg total) by mouth 2 (two) times daily.  . metoprolol tartrate (LOPRESSOR) 25 MG tablet Take 12.5 mg by mouth 2 (two) times daily.  . mirtazapine  (REMERON) 30 MG tablet Take 30 mg by mouth at bedtime.  . risperiDONE (RISPERDAL) 1 MG tablet Take 1 mg by mouth at bedtime.  . senna-docusate (SENOKOT-S) 8.6-50 MG tablet Take 2 tablets by mouth at bedtime as needed for mild constipation.   No facility-administered encounter medications on file as of 01/01/2018.     Review of Systems  Unable to perform ROS: Dementia    Immunization History  Administered Date(s) Administered  . Influenza, High Dose Seasonal PF 12/07/2016  . Influenza,inj,Quad PF,6+ Mos 12/01/2017  . Influenza-Unspecified 12/11/2010, 11/18/2011, 11/25/2012, 11/15/2014, 12/19/2015  . Pneumococcal Conjugate-13 12/05/2014  . Pneumococcal Polysaccharide-23 08/02/2005  . Td 12/05/2014  . Tdap 08/02/2004  . Zoster 08/03/2006   Pertinent  Health Maintenance Due  Topic Date Due  . INFLUENZA VACCINE  Completed  . PNA vac Low Risk Adult  Completed   Fall Risk  04/28/2017  Falls in the past year? Yes  Number falls in past yr: 2 or more  Injury with Fall? Yes   Functional Status Survey:    Vitals:   01/01/18 1200  BP: (!) 92/53  Pulse: 89  Resp: (!) 23  Temp: 98.2 F (36.8 C)  SpO2: (!) 89%   There is no height or weight on file to calculate BMI. Physical Exam  Constitutional: No distress.  Thin and frail male  HENT:  Head: Normocephalic and atraumatic.  Right Ear: External ear normal.  Left Ear: External ear normal.  Nose: Nose normal.  Mouth/Throat: No oropharyngeal exudate.  Very dry mouth  Eyes: Pupils are equal, round, and reactive to light. Right eye exhibits no discharge. Left eye exhibits no discharge.  Neck: No JVD present. No tracheal deviation present. No thyromegaly present.  Cardiovascular: Normal rate and regular rhythm.  Murmur heard. Pulmonary/Chest: Effort normal and breath sounds normal. No respiratory distress.  Scattered rhonchi bilaterally  Abdominal: Soft. Bowel sounds are normal. He exhibits no distension. There is no tenderness.    Lymphadenopathy:    He has no cervical adenopathy.  Neurological:  Eyes closed, minimally responds and does not follow commands.   Skin: Skin is warm. He is diaphoretic.    Labs reviewed: Recent Labs    01/06/17 0555 01/08/17 0603 01/09/17 0552 03/18/17 0400  NA 144 140 137 139  K 3.4* 3.7 3.8 4.5  CL 110 107 106  --   CO2 27 27 25   --   GLUCOSE 144* 96 94  --   BUN 23* 18 17 17   CREATININE 0.79 0.84 0.78 0.7  CALCIUM 8.8* 8.7* 8.5*  --    Recent Labs    03/18/17 0400  AST 16  ALT 9*  ALKPHOS 117   Recent Labs    01/02/17 0851 01/03/17 0624 01/05/17 0512 03/18/17 0400  WBC 6.7 6.4 5.4 5.8  HGB 10.6* 10.3*  9.2* 10.0*  HCT 32.0* 30.6* 27.5* 31*  MCV 95.5 94.7 96.2  --   PLT 220 229 262 339   Lab Results  Component Value Date   TSH 2.71 04/22/2017   Lab Results  Component Value Date   HGBA1C 6.0 12/19/2015   Lab Results  Component Value Date   CHOL 238 (A) 04/01/2016   HDL 44 04/01/2016   LDLCALC 153 04/01/2016   TRIG 200 (A) 04/01/2016    Significant Diagnostic Results in last 30 days:  No results found.  Assessment/Plan  1. Aspiration pneumonia due to regurgitated food, unspecified laterality, unspecified part of lung (HCC) Will continue with comfort care measures only as per most form. Continue oxygen for comfort only Roxanol 5 mg q 4 hrs prn sob/pain. Tylenol supp as needed for fever.   2. Oropharyngeal dysphagia He can continue a puree diet with NTL if more alert and able to sit upright.   Family/ staff Communication: discussed with his POA Ms. Lalla Brothers  Labs/tests ordered:  Comfort care

## 2018-01-19 ENCOUNTER — Non-Acute Institutional Stay (SKILLED_NURSING_FACILITY): Payer: Medicare Other | Admitting: Internal Medicine

## 2018-01-19 ENCOUNTER — Encounter: Payer: Self-pay | Admitting: Internal Medicine

## 2018-01-19 DIAGNOSIS — F329 Major depressive disorder, single episode, unspecified: Secondary | ICD-10-CM | POA: Diagnosis not present

## 2018-01-19 DIAGNOSIS — F01518 Vascular dementia, unspecified severity, with other behavioral disturbance: Secondary | ICD-10-CM

## 2018-01-19 DIAGNOSIS — E890 Postprocedural hypothyroidism: Secondary | ICD-10-CM

## 2018-01-19 DIAGNOSIS — I10 Essential (primary) hypertension: Secondary | ICD-10-CM | POA: Diagnosis not present

## 2018-01-19 DIAGNOSIS — F0151 Vascular dementia with behavioral disturbance: Secondary | ICD-10-CM

## 2018-01-19 DIAGNOSIS — R627 Adult failure to thrive: Secondary | ICD-10-CM

## 2018-01-19 DIAGNOSIS — R1312 Dysphagia, oropharyngeal phase: Secondary | ICD-10-CM | POA: Diagnosis not present

## 2018-01-19 NOTE — Progress Notes (Signed)
Patient ID: Benjamin Banks, male   DOB: 1925/04/23, 84 y.o.   MRN: [id number]  Location:  [facility] Room Number: 137 Place of Service:  SNF ([phone]) Provider:   [name], Apollonia Amini L, DO  Patient Care Team: [name], Jaziah Goeller L, DO as PCP - General (Geriatric Medicine) Samson FredericSwinteck, Brian, MD as Consulting Physician (Orthopedic Surgery)  Extended Emergency Contact Information Primary Emergency Contact: Lambeth,Judith Address: [address]          [place], [place] [address] [place] Home Phone: [phone] Mobile Phone: [phone] Relation: Relative Secondary Emergency Contact: Curley SpiceBowman,Una  [place] Home Phone: [phone] Relation: Niece  Code Status:  DNR, MOST Goals of care: Advanced Directive information Advanced Directives 01/19/2018  Does Patient Have a Medical Advance Directive? Yes  Type of Estate agentAdvance Directive Healthcare Power of [place];Living will;Out of facility DNR (pink MOST or yellow form)  Does patient want to make changes to medical advance directive? No - Patient declined  Copy of Healthcare Power of Attorney in Chart? Yes - validated most recent copy scanned in chart (See row information)  Would patient like information on creating a medical advance directive? -  Pre-existing out of facility DNR order (yellow form or pink MOST form) Yellow form placed in chart (order not valid for inpatient use);Pink MOST form placed in chart (order not valid for inpatient use)   Chief Complaint  Patient presents with  . Medical Management of Chronic Issues    Routine Visit    HPI:  Pt is a 84 y.o. male seen today for medical management of chronic diseases.  He had been staying in memory care, but, due to his lack of mobility and advanced stage, he was no longer benefiting from the memory care environment and is now receiving truly skilled level of care in snf.  He has been taken off his ativan and is more alert and seen  sitting in the halls in his wheelchair rather than in bed much of the day.    He's struggled with depression and dementia and remains on remeron, risperdal for psychosis, and depakote for mood stabilization.  He has become agitated and combative during care in the past and has a history of smearing his stools from his depends on the wall, etc., but the only note I see recently about something along these lines is where he had his hands in his brief.    He remains on levothyroxine for hypothyroidism.   Lab Results  Component Value Date   TSH 2.71 04/22/2017   His bowels are moving with senokot-s.    He has morphine for pain.  He had prior left hip fx and thoracic spine compression fx.  Notes indicate only fidgetiness, but no signs of distress over the past several weeks.    Weight had trended down, but is back up to 170 lbs recently.    Past Medical History:  Diagnosis Date  . Anemia   . Benign positional vertigo   . Bilateral carpal tunnel syndrome   . BPH (benign prostatic hyperplasia)   . Chest pain    noncardiac  . Chronic anal fissure   . Chronic low back pain   . Dementia (HCC)   . Disease    perrone  . Diverticulosis   . DJD (degenerative joint disease)   . Elevated blood sugar   . GERD (gastroesophageal reflux disease)   . Gilbert's syndrome   . H/O thyroidectomy   . Heart murmur   .  Hypercholesteremia   . Hypertension    labils  . Hyperthyroidism   . Iron deficiency   . Lichen planus   . Peripheral neuropathy   . Polymyalgia rheumatica (HCC)    Past Surgical History:  Procedure Laterality Date  . APPENDECTOMY    . bottom teeth removed with implants    . hemrrhoidectomy    . left and right cataract    . left lens implant    . THYROIDECTOMY, PARTIAL    . TONSILLECTOMY    . TOTAL HIP ARTHROPLASTY Left 12/30/2016   Procedure: LEFT HIP HEMIARTHROPLASTY  ANTERIOR APPROACH;  Surgeon: Samson Frederic, MD;  Location: [facility];  Service: Orthopedics;  Laterality:  Left;    No Known Allergies  Outpatient Encounter Medications as of 01/19/2018  Medication Sig  . aspirin EC 81 MG tablet Take 81 mg by mouth daily.  . divalproex (DEPAKOTE SPRINKLE) 125 MG capsule Take 250 mg by mouth 3 (three) times daily.  Marland Kitchen lactose free nutrition (BOOST) LIQD Take 237 mLs by mouth 2 (two) times daily between meals.  Marland Kitchen levothyroxine (SYNTHROID, LEVOTHROID) 88 MCG tablet Take 88 mcg by mouth daily before breakfast.  . metoprolol tartrate (LOPRESSOR) 25 MG tablet Take 12.5 mg by mouth 2 (two) times daily.  . mirtazapine (REMERON) 30 MG tablet Take 30 mg by mouth at bedtime.  Marland Kitchen morphine (ROXANOL) 20 MG/ML concentrated solution Take 5 mg by mouth every 4 (four) hours as needed for severe pain.  Marland Kitchen risperiDONE (RISPERDAL) 1 MG tablet Take 1 mg by mouth at bedtime.  . senna-docusate (SENOKOT-S) 8.6-50 MG tablet Take 2 tablets by mouth at bedtime as needed for mild constipation.  . [DISCONTINUED] LORazepam (ATIVAN) 0.5 MG tablet Take 0.5 tablets (0.25 mg total) by mouth 2 (two) times daily.   No facility-administered encounter medications on file as of 01/19/2018.     Review of Systems  Constitutional: Negative for activity change, appetite change and unexpected weight change.  HENT: Negative for congestion.   Eyes: Negative for visual disturbance.  Respiratory: Negative for chest tightness and shortness of breath.   Genitourinary: Negative for dysuria.  Musculoskeletal: Positive for gait problem. Negative for arthralgias and back pain.  Skin: Positive for pallor. Negative for color change.  Neurological: Positive for weakness. Negative for dizziness.  Hematological: Bruises/bleeds easily.  Psychiatric/Behavioral: Positive for confusion and hallucinations. Negative for agitation, behavioral problems and sleep disturbance. The patient is not nervous/anxious.     Immunization History  Administered Date(s) Administered  . Influenza, High Dose Seasonal PF 12/07/2016  .  Influenza,inj,Quad PF,6+ Mos 12/01/2017  . Influenza-Unspecified 12/11/2010, 11/18/2011, 11/25/2012, 11/15/2014, 12/19/2015  . Pneumococcal Conjugate-13 12/05/2014  . Pneumococcal Polysaccharide-23 08/02/2005  . Td 12/05/2014  . Tdap 08/02/2004  . Zoster 08/03/2006  . Zoster Recombinat (Shingrix) 05/12/2017, 10/14/2017   Pertinent  Health Maintenance Due  Topic Date Due  . INFLUENZA VACCINE  Completed  . PNA vac Low Risk Adult  Completed   Fall Risk  04/28/2017  Falls in the past year? Yes  Number falls in past yr: 2 or more  Injury with Fall? Yes   Functional Status Survey:    Vitals:   01/19/18 1143  BP: (!) 117/56  Pulse: 61  Resp: 16  Temp: 97.6 F (36.4 C)  TempSrc: Oral  SpO2: 98%  Weight: 120 lb (54.4 kg)  Height: 5\' 7"  (1.702 m)   Body mass index is 18.79 kg/m. Physical Exam Constitutional:      General: He is not in  acute distress.    Appearance: He is not toxic-appearing.  HENT:     Head: Normocephalic.  Cardiovascular:     Rate and Rhythm: Normal rate and regular rhythm.     Pulses: Normal pulses.     Heart sounds: Normal heart sounds.  Pulmonary:     Effort: Pulmonary effort is normal.     Breath sounds: Normal breath sounds.  Abdominal:     General: Abdomen is flat. Bowel sounds are normal.  Musculoskeletal: Normal range of motion.  Skin:    Capillary Refill: Capillary refill takes less than 2 seconds.  Neurological:     Mental Status: He is alert.     Comments: Minimally verbal     Labs reviewed: Recent Labs    03/18/17 0400  NA 139  K 4.5  BUN 17  CREATININE 0.7   Recent Labs    03/18/17 0400  AST 16  ALT 9*  ALKPHOS 117   Recent Labs    03/18/17 0400  WBC 5.8  HGB 10.0*  HCT 31*  PLT 339   Lab Results  Component Value Date   TSH 2.71 04/22/2017   Lab Results  Component Value Date   HGBA1C 6.0 12/19/2015   Lab Results  Component Value Date   CHOL 238 (A) 04/01/2016   HDL 44 04/01/2016   LDLCALC 153  04/01/2016   TRIG 200 (A) 04/01/2016    Assessment/Plan 1. Oropharyngeal dysphagia -cont pureed, nectar thickened liquids and boost supplement, aspiration precautions  2. Vascular dementia with behavior disturbance (HCC) -advanced, cont SNF level of care  3. Essential hypertension -bp controlled with current routine, no changes needed  4. Reactive depression -cont his remeron and depakote for mood stabilization, seems to be improved without ativan  5. Postoperative hypothyroidism -cont current levothyroxine and monitor  6. Failure to thrive in adult -weight back up recently, cont support and encouragement by staff with meals  Family/ staff Communication: discussed with snf nurse  Labs/tests ordered:  No new  Thana Ramp L. Ayana Imhof, D.O. Geriatrics [facility] [address], [place] [address] Cell Phone (Mon-Fri 8am-5pm):  [phone] On Call:  [phone] & follow prompts after 5pm & weekends Office Phone:  [phone] Office Fax:  [phone]

## 2018-02-18 ENCOUNTER — Encounter: Payer: Self-pay | Admitting: Adult Health

## 2018-02-18 ENCOUNTER — Non-Acute Institutional Stay (SKILLED_NURSING_FACILITY): Payer: Medicare Other | Admitting: Adult Health

## 2018-02-18 DIAGNOSIS — F01518 Vascular dementia, unspecified severity, with other behavioral disturbance: Secondary | ICD-10-CM

## 2018-02-18 DIAGNOSIS — R1312 Dysphagia, oropharyngeal phase: Secondary | ICD-10-CM

## 2018-02-18 DIAGNOSIS — I10 Essential (primary) hypertension: Secondary | ICD-10-CM

## 2018-02-18 DIAGNOSIS — E890 Postprocedural hypothyroidism: Secondary | ICD-10-CM

## 2018-02-18 DIAGNOSIS — F329 Major depressive disorder, single episode, unspecified: Secondary | ICD-10-CM

## 2018-02-18 DIAGNOSIS — R627 Adult failure to thrive: Secondary | ICD-10-CM

## 2018-02-18 DIAGNOSIS — F0151 Vascular dementia with behavioral disturbance: Secondary | ICD-10-CM | POA: Diagnosis not present

## 2018-02-18 DIAGNOSIS — R634 Abnormal weight loss: Secondary | ICD-10-CM

## 2018-02-18 NOTE — Progress Notes (Signed)
Location:  Medical illustrator of Service:  SNF (31) Provider:   Peggye Ley, ANP [facility] [phone]  Kermit Balo, DO  Patient Care Team: Kermit Balo, DO as PCP - General (Geriatric Medicine) Samson Frederic, MD as Consulting Physician (Orthopedic Surgery)  Extended Emergency Contact Information Primary Emergency Contact: Lambeth,Judith Address: [address]          [place], [place] [address] [place] Home Phone: [phone] Mobile Phone: [phone] Relation: Relative Secondary Emergency Contact: Curley Spice [place] Home Phone: [phone] Relation: Niece  Code Status:  DNR Goals of care: Advanced Directive information Advanced Directives 01/19/2018  Does Patient Have a Medical Advance Directive? Yes  Type of Estate agent of [place];Living will;Out of facility DNR (pink MOST or yellow form)  Does patient want to make changes to medical advance directive? No - Patient declined  Copy of Healthcare Power of Attorney in Chart? Yes - validated most recent copy scanned in chart (See row information)  Would patient like information on creating a medical advance directive? -  Pre-existing out of facility DNR order (yellow form or pink MOST form) Yellow form placed in chart (order not valid for inpatient use);Pink MOST form placed in chart (order not valid for inpatient use)     Chief Complaint  Patient presents with  . Medical Management of Chronic Issues    HPI:  Benjamin Banks is a 85 y.o. male seen today for medial management of chronic diseases.   Mr. Eilert has a hx of severe vascular dementia and is minimally verbal. He has periods of combative behavior during personal care but his nurse reports this has improved over time as he sleeps more. Dr Donell Beers reduced risperdal to 0.5 on 12/12. No worsening in behavior noted. Dysphagia: he continues on a puree diet with NTL. No reports  of choking but has a chronic cough with meals.  Goals of care are comfort based. He has had several bouts of presumed pna that were not treated but he survived.   Hx of partial thyroidectomy on thyroid supplementation Lab Results  Component Value Date   TSH 2.71 04/22/2017   He continues to have a small appetite and weight loss Wt Readings from Last 3 Encounters:  02/20/18 116 lb 6.4 oz (52.8 kg)  01/19/18 120 lb (54.4 kg)  12/21/17 117 lb 9.6 oz (53.3 kg)   HTN controlled  Depression: flat affect, poor appetite, sleeping more, non verbal so this is difficult to assess   Past Medical History:  Diagnosis Date  . Anemia   . Benign positional vertigo   . Bilateral carpal tunnel syndrome   . BPH (benign prostatic hyperplasia)   . Chest pain    noncardiac  . Chronic anal fissure   . Chronic low back pain   . Dementia (HCC)   . Disease    perrone  . Diverticulosis   . DJD (degenerative joint disease)   . Elevated blood sugar   . GERD (gastroesophageal reflux disease)   . Gilbert's syndrome   . H/O thyroidectomy   . Heart murmur   . Hypercholesteremia   . Hypertension    labils  . Hyperthyroidism   . Iron deficiency   . Lichen planus   . Peripheral neuropathy   . Polymyalgia rheumatica (HCC)    Past Surgical History:  Procedure Laterality Date  . APPENDECTOMY    . bottom teeth removed with implants    .  hemrrhoidectomy    . left and right cataract    . left lens implant    . THYROIDECTOMY, PARTIAL    . TONSILLECTOMY    . TOTAL HIP ARTHROPLASTY Left 12/30/2016   Procedure: LEFT HIP HEMIARTHROPLASTY  ANTERIOR APPROACH;  Surgeon: Samson Frederic, MD;  Location: [facility];  Service: Orthopedics;  Laterality: Left;    No Known Allergies  Outpatient Encounter Medications as of 02/18/2018  Medication Sig  . aspirin EC 81 MG tablet Take 81 mg by mouth daily.  . divalproex (DEPAKOTE SPRINKLE) 125 MG capsule Take 250 mg by mouth 3 (three) times daily.  Marland Kitchen lactose free  nutrition (BOOST) LIQD Take 237 mLs by mouth 2 (two) times daily between meals.  Marland Kitchen levothyroxine (SYNTHROID, LEVOTHROID) 88 MCG tablet Take 88 mcg by mouth daily before breakfast.  . metoprolol tartrate (LOPRESSOR) 25 MG tablet Take 12.5 mg by mouth 2 (two) times daily.  . mirtazapine (REMERON) 30 MG tablet Take 30 mg by mouth at bedtime.  Marland Kitchen morphine (ROXANOL) 20 MG/ML concentrated solution Take 5 mg by mouth every 4 (four) hours as needed for severe pain.  Marland Kitchen Ophthalmic Irrigation Solution (OCUSOFT EYE WASH OP) Apply 1 application to eye daily.  . risperiDONE (RISPERDAL) 1 MG tablet Take 0.5 mg by mouth at bedtime.   . senna-docusate (SENOKOT-S) 8.6-50 MG tablet Take 2 tablets by mouth at bedtime as needed for mild constipation.   No facility-administered encounter medications on file as of 02/18/2018.     Review of Systems  Unable to perform ROS: Dementia    Immunization History  Administered Date(s) Administered  . Influenza, High Dose Seasonal PF 12/07/2016  . Influenza,inj,Quad PF,6+ Mos 12/01/2017  . Influenza-Unspecified 12/11/2010, 11/18/2011, 11/25/2012, 11/15/2014, 12/19/2015  . Pneumococcal Conjugate-13 12/05/2014  . Pneumococcal Polysaccharide-23 08/02/2005  . Td 12/05/2014  . Tdap 08/02/2004  . Zoster 08/03/2006  . Zoster Recombinat (Shingrix) 05/12/2017, 10/14/2017   Pertinent  Health Maintenance Due  Topic Date Due  . INFLUENZA VACCINE  Completed  . PNA vac Low Risk Adult  Completed   Fall Risk  02/02/2018 04/28/2017  Falls in the past year? 1 Yes  Number falls in past yr: 1 2 or more  Injury with Fall? 0 Yes  Risk for fall due to : History of fall(s);Impaired balance/gait;Impaired mobility;Medication side effect;Mental status change -  Follow up Falls evaluation completed;Education provided;Falls prevention discussed -   Functional Status Survey:    Vitals:   02/20/18 1044  Weight: 116 lb 6.4 oz (52.8 kg)   Body mass index is 18.23 kg/m. Physical  Exam Constitutional:      General: He is not in acute distress.    Appearance: He is not diaphoretic.     Comments: Frail, thin male  HENT:     Head: Normocephalic and atraumatic.  Neck:     Musculoskeletal: Normal range of motion and neck supple.     Thyroid: No thyromegaly.     Vascular: No JVD.     Trachea: No tracheal deviation.  Cardiovascular:     Rate and Rhythm: Normal rate and regular rhythm.     Heart sounds: Murmur present.  Pulmonary:     Effort: Pulmonary effort is normal. No respiratory distress.     Breath sounds: Normal breath sounds. No wheezing.  Abdominal:     General: Bowel sounds are normal. There is no distension.     Palpations: Abdomen is soft.     Tenderness: There is no abdominal tenderness.  Lymphadenopathy:  Cervical: No cervical adenopathy.  Skin:    General: Skin is warm and dry.  Neurological:     Mental Status: He is alert. Mental status is at baseline.     Comments: Not verbal and not able to f/c  Psychiatric:     Comments: flat      Labs reviewed: Recent Labs    03/18/17 0400  NA 139  K 4.5  BUN 17  CREATININE 0.7   Recent Labs    03/18/17 0400  AST 16  ALT 9*  ALKPHOS 117   Recent Labs    03/18/17 0400  WBC 5.8  HGB 10.0*  HCT 31*  PLT 339   Lab Results  Component Value Date   TSH 2.71 04/22/2017   Lab Results  Component Value Date   HGBA1C 6.0 12/19/2015   Lab Results  Component Value Date   CHOL 238 (A) 04/01/2016   HDL 44 04/01/2016   LDLCALC 153 04/01/2016   TRIG 200 (A) 04/01/2016    Significant Diagnostic Results in last 30 days:  No results found.  Assessment/Plan  1. Vascular dementia with behavior disturbance (HCC) Severe Improved behaviors with less combativeness   2. Reactive depression Followed by [facility] Continue Remeron and Depakote  3. Failure to thrive in adult Comfort care goals  4. Essential hypertension Controlled  Continue lopressor 12.5 mg bid  5. Oropharyngeal  dysphagia Continue modified diet and asp prec  6. Postoperative hypothyroidism Continue synthroid 88 mcg qd  7. Weight loss Due to progressive dementia, dysphagia, and periodic acute illness Continue nutritional supplements D/c compression hose  Family/ staff Communication: discussed with nurse  Labs/tests ordered:  NA

## 2018-02-25 ENCOUNTER — Non-Acute Institutional Stay (SKILLED_NURSING_FACILITY): Payer: Medicare Other | Admitting: Adult Health

## 2018-02-25 ENCOUNTER — Encounter: Payer: Self-pay | Admitting: Adult Health

## 2018-02-25 DIAGNOSIS — R1312 Dysphagia, oropharyngeal phase: Secondary | ICD-10-CM | POA: Diagnosis not present

## 2018-02-25 DIAGNOSIS — R05 Cough: Secondary | ICD-10-CM

## 2018-02-25 DIAGNOSIS — R5383 Other fatigue: Secondary | ICD-10-CM

## 2018-02-25 DIAGNOSIS — R059 Cough, unspecified: Secondary | ICD-10-CM

## 2018-02-25 NOTE — Progress Notes (Signed)
[facility]   Place of Service:  SNF (31) Provider:   Peggye Leyhristy Garnett Nunziata, ANP [facility] [phone]   [name], [name], DO  Patient Care Team: [name], [name], DO as PCP - General (Geriatric Medicine) Samson FredericSwinteck, Brian, MD as Consulting Physician (Orthopedic Surgery)  Extended Emergency Contact Information Primary Emergency Contact: Lambeth,Judith Address: [address]          [place], [place] [address] [place] Home Phone: [phone] Mobile Phone: [phone] Relation: Relative Secondary Emergency Contact: Curley SpiceBowman,Una  [place] Home Phone: [phone] Relation: Niece  Code Status:  DNR Goals of care: Advanced Directive information Advanced Directives 01/19/2018  Does Patient Have a Medical Advance Directive? Yes  Type of Estate agentAdvance Directive Healthcare Power of [place]orney;Living will;Out of facility DNR (pink MOST or yellow form)  Does patient want to make changes to medical advance directive? No - Patient declined  Copy of Healthcare Power of Attorney in Chart? Yes - validated most recent copy scanned in chart (See row information)  Would patient like information on creating a medical advance directive? -  Pre-existing out of facility DNR order (yellow form or pink MOST form) Yellow form placed in chart (order not valid for inpatient use);Pink MOST form placed in chart (order not valid for inpatient use)     Chief Complaint  Patient presents with  . Acute Visit    cough    HPI:  Pt is a 85 y.o. male seen today for an acute visit for cough. He has a hx of severe vascular dementia and dysphagia. He has had periods of cough and fever that were likely associated with aspiration pna in the past but due to his poor quality of life and goals of care, were not treated .  He recovered from those but now presents with a congested cough, temp of 99.5, lethargy, decreased intake, and decreased 02 sats. These  symptoms have been present for two days. His sats decreased to 85% on 1/15 and he was placed on oxygen 3 liters. Sats are now 97%.  He has an increase resp rate in the mid 20's but otherwise appears comfortable per the nurse.    Past Medical History:  Diagnosis Date  . Anemia   . Benign positional vertigo   . Bilateral carpal tunnel syndrome   . BPH (benign prostatic hyperplasia)   . Chest pain    noncardiac  . Chronic anal fissure   . Chronic low back pain   . Dementia (HCC)   . Disease    perrone  . Diverticulosis   . DJD (degenerative joint disease)   . Elevated blood sugar   . GERD (gastroesophageal reflux disease)   . Gilbert's syndrome   . H/O thyroidectomy   . Heart murmur   . Hypercholesteremia   . Hypertension    labils  . Hyperthyroidism   . Iron deficiency   . Lichen planus   . Peripheral neuropathy   . Polymyalgia rheumatica (HCC)    Past Surgical History:  Procedure Laterality Date  . APPENDECTOMY    . bottom teeth removed with implants    . hemrrhoidectomy    . left and right cataract    . left lens implant    . THYROIDECTOMY, PARTIAL    . TONSILLECTOMY    . TOTAL HIP ARTHROPLASTY Left 12/30/2016   Procedure: LEFT HIP HEMIARTHROPLASTY  ANTERIOR APPROACH;  Surgeon: Samson FredericSwinteck, Brian, MD;  Location: [facility] ORS;  Service: Orthopedics;  Laterality: Left;    No Known Allergies  Outpatient Encounter Medications as of 02/25/2018  Medication Sig  . aspirin EC 81 MG tablet Take 81 mg by mouth daily.  . divalproex (DEPAKOTE SPRINKLE) 125 MG capsule Take 250 mg by mouth 3 (three) times daily.  Marland Kitchen lactose free nutrition (BOOST) LIQD Take 237 mLs by mouth 2 (two) times daily between meals.  Marland Kitchen levothyroxine (SYNTHROID, LEVOTHROID) 88 MCG tablet Take 88 mcg by mouth daily before breakfast.  . metoprolol tartrate (LOPRESSOR) 25 MG tablet Take 12.5 mg by mouth 2 (two) times daily.  . mirtazapine (REMERON) 30 MG tablet Take 30 mg by mouth at bedtime.  Marland Kitchen morphine (ROXANOL)  20 MG/ML concentrated solution Take 5 mg by mouth every 4 (four) hours as needed for severe pain.  Marland Kitchen Ophthalmic Irrigation Solution (OCUSOFT EYE WASH OP) Apply 1 application to eye daily.  . risperiDONE (RISPERDAL) 1 MG tablet Take 0.5 mg by mouth at bedtime.   . senna-docusate (SENOKOT-S) 8.6-50 MG tablet Take 2 tablets by mouth at bedtime as needed for mild constipation.   No facility-administered encounter medications on file as of 02/25/2018.     Review of Systems  Unable to perform ROS: Dementia    Immunization History  Administered Date(s) Administered  . Influenza, High Dose Seasonal PF 12/07/2016  . Influenza,inj,Quad PF,6+ Mos 12/01/2017  . Influenza-Unspecified 12/11/2010, 11/18/2011, 11/25/2012, 11/15/2014, 12/19/2015  . Pneumococcal Conjugate-13 12/05/2014  . Pneumococcal Polysaccharide-23 08/02/2005  . Td 12/05/2014  . Tdap 08/02/2004  . Zoster 08/03/2006  . Zoster Recombinat (Shingrix) 05/12/2017, 10/14/2017   Pertinent  Health Maintenance Due  Topic Date Due  . INFLUENZA VACCINE  Completed  . PNA vac Low Risk Adult  Completed   Fall Risk  02/02/2018 04/28/2017  Falls in the past year? 1 Yes  Number falls in past yr: 1 2 or more  Injury with Fall? 0 Yes  Risk for fall due to : History of fall(s);Impaired balance/gait;Impaired mobility;Medication side effect;Mental status change -  Follow up Falls evaluation completed;Education provided;Falls prevention discussed -   Functional Status Survey:    Vitals:   02/25/18 1125  Resp: (!) 25  Temp: 99.5 F (37.5 C)  SpO2: 95%   There is no height or weight on file to calculate BMI. Physical Exam Vitals signs and nursing note reviewed.  Constitutional:      Appearance: He is ill-appearing.     Comments: Lethargic, thin and frail  HENT:     Head: Normocephalic and atraumatic.     Nose: Congestion and rhinorrhea present.     Mouth/Throat:     Pharynx: Oropharyngeal exudate present.     Comments: Very dry mouth  with purulent drainage in the back of his throat Cardiovascular:     Rate and Rhythm: Normal rate and regular rhythm.  Pulmonary:     Breath sounds: Rhonchi and rales present.     Comments: Increase wob Abdominal:     General: Abdomen is flat. Bowel sounds are normal.     Palpations: Abdomen is soft.  Musculoskeletal:     Right lower leg: No edema.     Left lower leg: No edema.  Lymphadenopathy:     Cervical: No cervical adenopathy.  Skin:    General: Skin is warm and dry.     Comments: Left heel pink but blanches  Neurological:     Comments: Not verbal and not able to follow commands     Labs reviewed: Recent Labs  03/18/17 0400  NA 139  K 4.5  BUN 17  CREATININE 0.7   Recent Labs    03/18/17 0400  AST 16  ALT 9*  ALKPHOS 117   Recent Labs    03/18/17 0400  WBC 5.8  HGB 10.0*  HCT 31*  PLT 339   Lab Results  Component Value Date   TSH 2.71 04/22/2017   Lab Results  Component Value Date   HGBA1C 6.0 12/19/2015   Lab Results  Component Value Date   CHOL 238 (A) 04/01/2016   HDL 44 04/01/2016   LDLCALC 153 04/01/2016   TRIG 200 (A) 04/01/2016    Significant Diagnostic Results in last 30 days:  No results found.  Assessment/Plan 1. Cough Congested cough with rhonchi in both lungs and lethargy with low grade temp likely representing aspiration pna given the resident's medical history. No antibiotics per end of life discussion in the past  Duonebs scheduled tid Oral suction to the bedside Heel boots for skin protection Roxanol for sob or discomfort already ordered.  2. Lethargy Due to probable aspiration pna  3. Oropharyngeal dysphagia Progressive and associated with progressive dementia. Goals of care are comfort based with no feedings tubes Discontinue all oral meds and add dulcolax supp prn constipation since he is not able to swallow.     Family/ staff Communication: staff to notify POA   Labs/tests ordered:  NA

## 2018-03-13 DEATH — deceased

## 2019-08-01 IMAGING — CT CT CERVICAL SPINE W/O CM
4 of 8 series · 12 of 33 positions shown, 13 images · non-contrast
Comparison: CT head 07/06/2016. CT head and cervical spine
04/28/2016.

CLINICAL DATA: Unwitnessed fall. History of dementia. Laceration to
the head. Cervical collar.

EXAM:
CT HEAD WITHOUT CONTRAST
CT CERVICAL SPINE WITHOUT CONTRAST
TECHNIQUE: Multidetector CT imaging of the head and cervical spine was
performed following the standard protocol without intravenous
contrast. Multiplanar CT image reconstructions of the cervical spine
were also generated.

[Series 5: coronal · coronal · 0.31mm/px · 1 of 65 slices shown]
[im 33/65  bone]
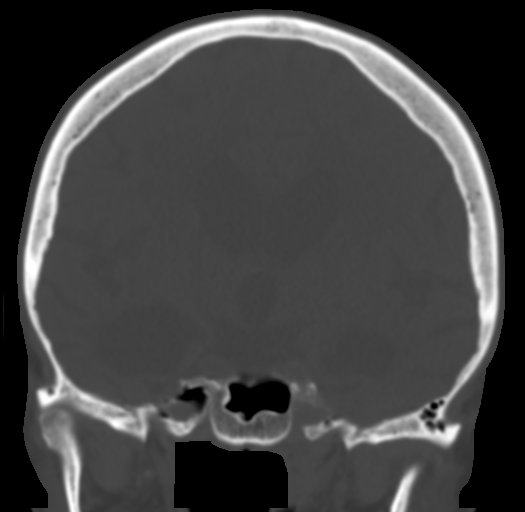

[Series 6: sagittal · sagittal · 0.31mm/px · 5 of 52 slices shown]
[im 9/52  bone]
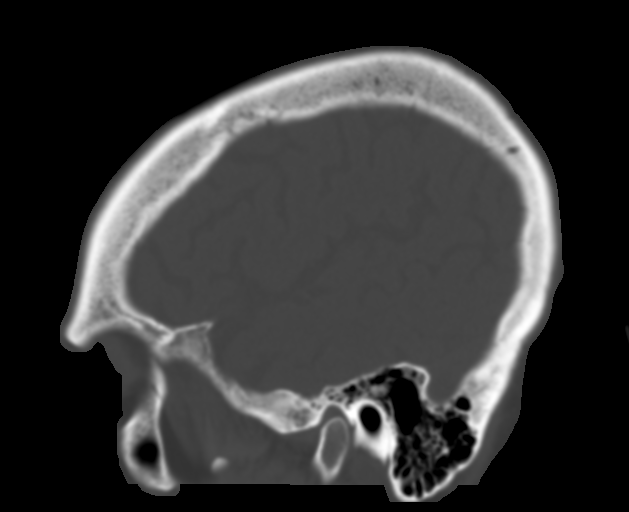
[im 18/52  bone]
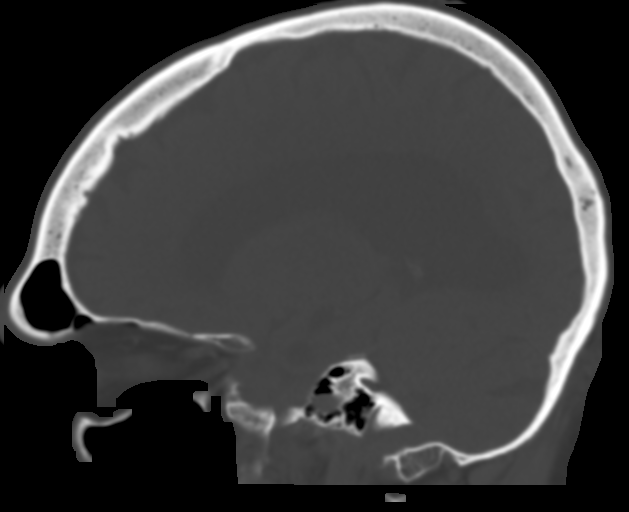
[im 26/52  bone]
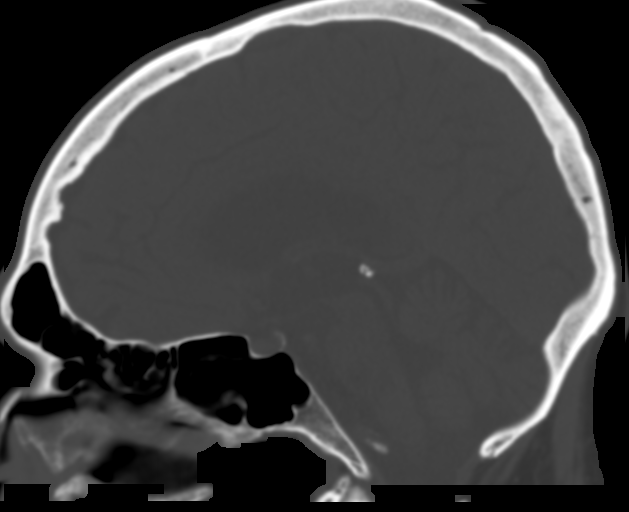
[im 35/52  bone]
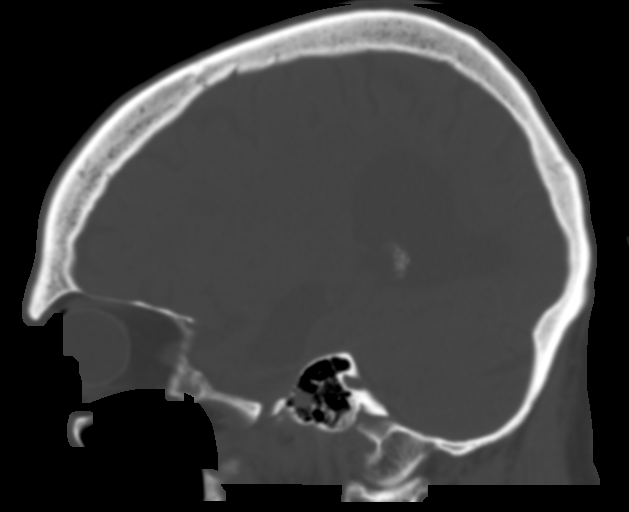
[im 43/52  bone]
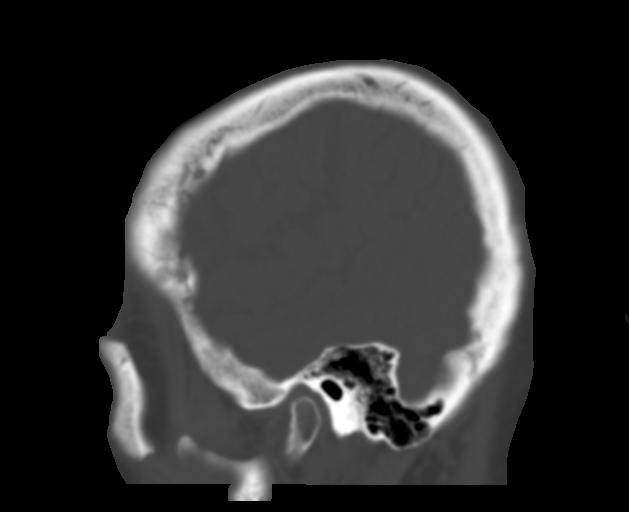

[Series 7: c-spine st · axial · 0.23mm/px · z∈[+1499,+1591]mm · 3 of 94 slices shown, 4 images]
[im 24/94  soft-tissue]
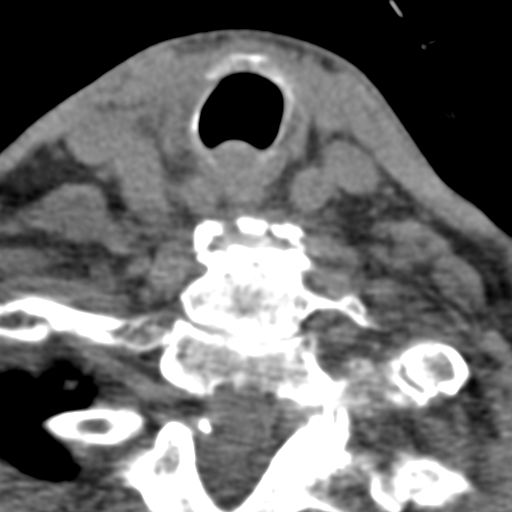
[im 24/94  bone]
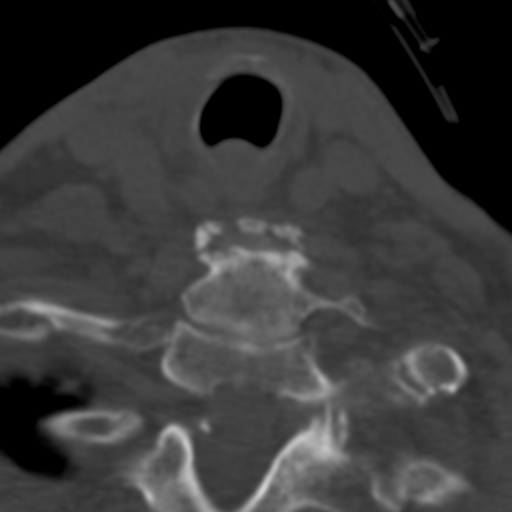
[im 47/94  bone]
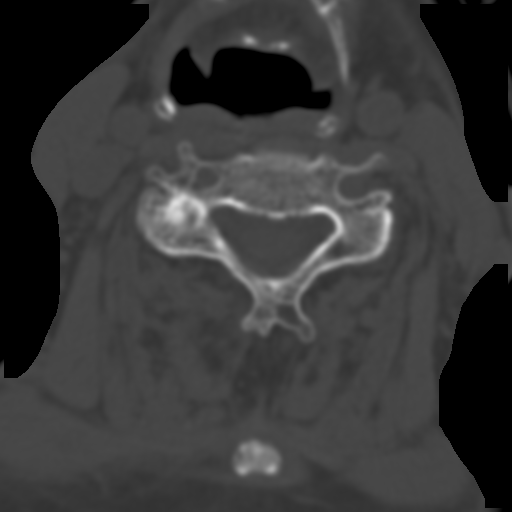
[im 70/94  bone]
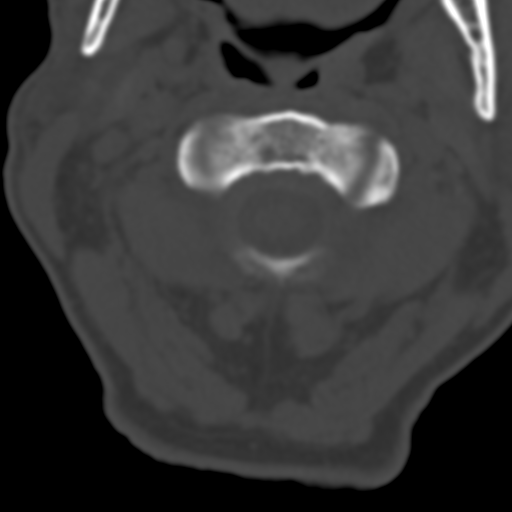

[Series 9: axial recon · axial · 0.20mm/px · z∈[+1464,+1557]mm · 3 of 102 slices shown]
[im 26/102  bone]
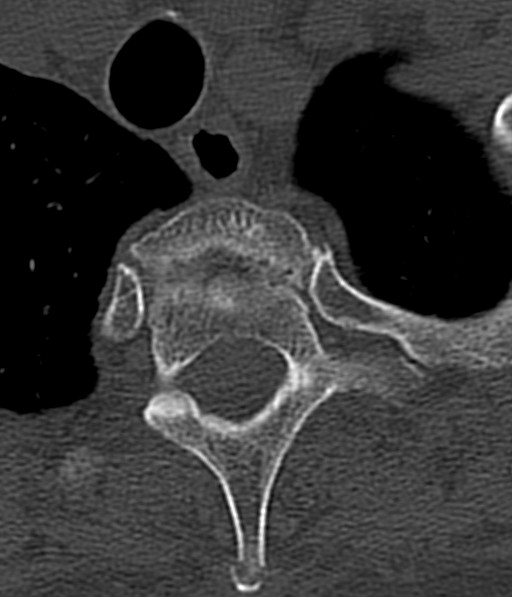
[im 51/102  bone]
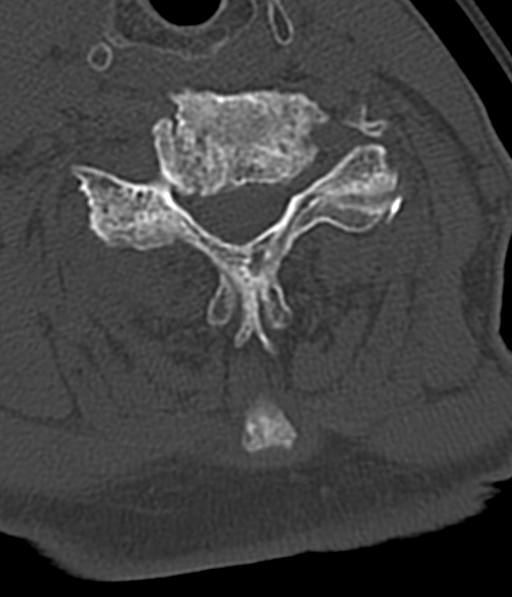
[im 76/102  bone]
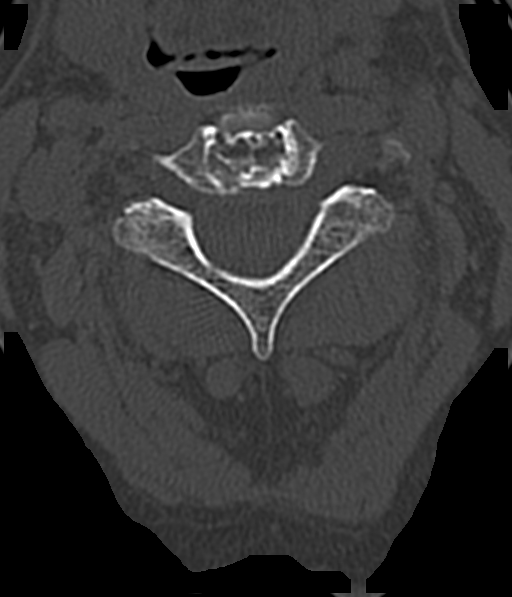

[12 of 33 positions shown; findings below may reference images not displayed]

FINDINGS: CT HEAD FINDINGS

Brain: Diffuse cerebral atrophy. Ventricular dilatation likely due
to central atrophy. Patchy low-attenuation changes in the deep white
matter likely due to small vessel ischemia. No mass-effect or
midline shift. No abnormal extra-axial fluid collections. Gray-white
matter junctions are distinct. Basal cisterns are not effaced. No
acute intracranial hemorrhage.

Vascular: Internal carotid artery and vertebrobasilar arterial
calcifications present.

Skull: Calvarium appears intact.

Sinuses/Orbits: Paranasal sinuses and mastoid air cells are not
opacified.

Other: Old nasal bone deformities.

CT CERVICAL SPINE FINDINGS

Alignment: There is about 3 mm anterior subluxation at C6-7, C7-T1,
and T1-T2 levels. This appearance is similar to previous study and
likely represents degenerative change. Normal alignment of the facet
joints. C1-2 articulation appears intact.

Skull base and vertebrae: Skullbase appears intact. No vertebral
compression deformities. No focal bone lesion or bone destruction.
Mildly displaced fractures are demonstrated along the spinous
processes at T2 and T3. These are new since the previous study. No
other acute fractures are demonstrated.

Soft tissues and spinal canal: No prevertebral soft tissue swelling.
No paraspinal soft tissue mass or infiltration.

Disc levels: Degenerative changes throughout the cervical spine with
narrowed interspaces and endplate hypertrophic changes. Degenerative
changes are prominent at C4-5, C5-6, and C6-7 levels as well as T1-2
and T2-3 levels. Degenerative changes throughout the cervical facet
joints.

Upper chest: Lung apices are clear.  Vascular calcifications.

Other: None.
IMPRESSION: 1. No acute intracranial abnormalities. Chronic atrophy and small
vessel ischemic changes.
2. Intracranial and cervical vascular calcifications.
3. Anterior subluxations in the cervical spine at multiple levels.
Alignment is unchanged since previous study and is likely due to
degenerative change.
4. Degenerative changes throughout the cervical spine.
5. Mildly displaced acute fractures of the spinous processes of T2
and T3.

## 2019-09-07 IMAGING — DX DG PORTABLE PELVIS
1 series · 1 of 1 positions shown · non-contrast
Comparison: Portable exam 0772 hours compared to intraoperative
images of 12/30/2016

CLINICAL DATA: Post LEFT hip arthroplasty

EXAM:
PORTABLE PELVIS 1-2 VIEWS

[pelvis ap]
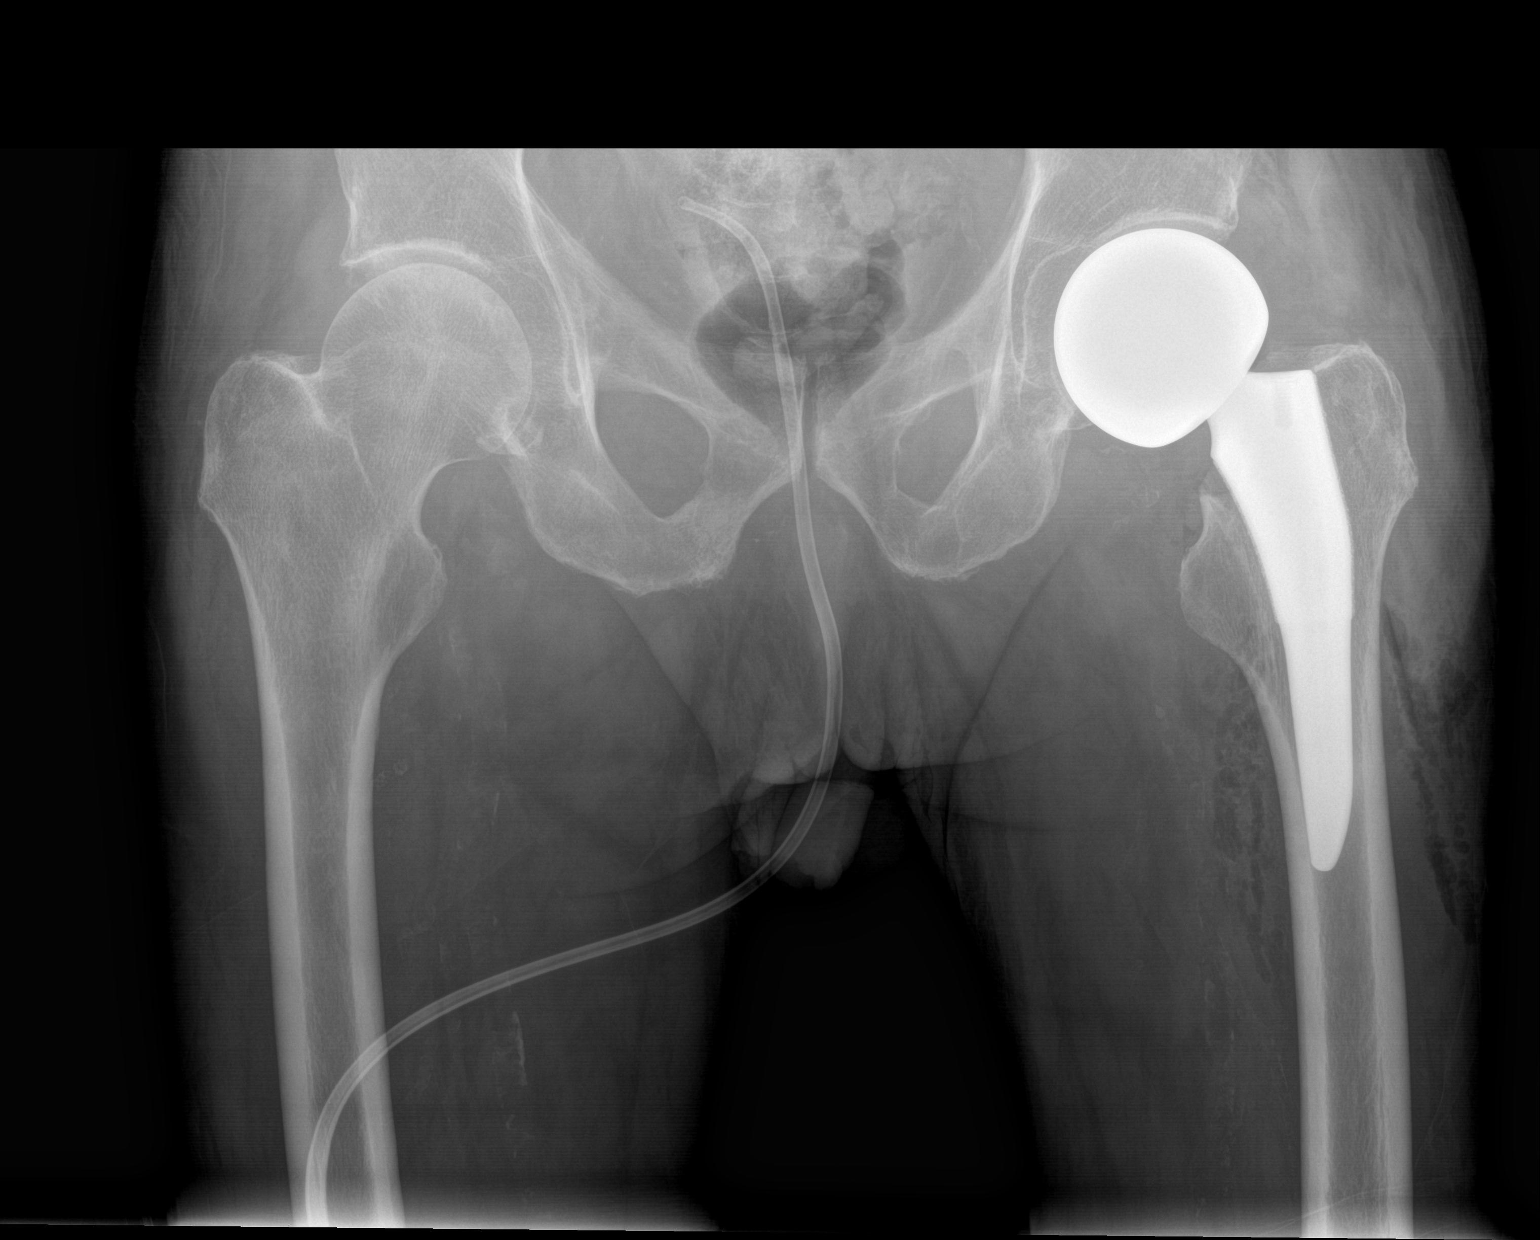

[1 of 1 positions shown; findings below may reference images not displayed]

FINDINGS: LEFT hip prosthesis identified in expected position.

No definite fracture or dislocation.

Bones appear demineralized.

Scattered soft tissue gas.

Catheter within urinary bladder.
IMPRESSION: LEFT hip prosthesis without acute complication.
